# Patient Record
Sex: Female | Born: 1975 | Race: Black or African American | Hispanic: No | Marital: Single | Smoking: Never smoker
Health system: Southern US, Community
[De-identification: ages and names within clinical notes are randomized; demographics above are authoritative.]

## PROBLEM LIST (undated history)

## (undated) DIAGNOSIS — T7840XA Allergy, unspecified, initial encounter: Secondary | ICD-10-CM

## (undated) DIAGNOSIS — M1990 Unspecified osteoarthritis, unspecified site: Secondary | ICD-10-CM

## (undated) DIAGNOSIS — M5430 Sciatica, unspecified side: Secondary | ICD-10-CM

## (undated) DIAGNOSIS — F419 Anxiety disorder, unspecified: Secondary | ICD-10-CM

## (undated) DIAGNOSIS — L309 Dermatitis, unspecified: Secondary | ICD-10-CM

## (undated) DIAGNOSIS — I10 Essential (primary) hypertension: Secondary | ICD-10-CM

## (undated) DIAGNOSIS — J45909 Unspecified asthma, uncomplicated: Secondary | ICD-10-CM

## (undated) DIAGNOSIS — E669 Obesity, unspecified: Secondary | ICD-10-CM

## (undated) HISTORY — DX: Allergy, unspecified, initial encounter: T78.40XA

## (undated) HISTORY — PX: NO PAST SURGERIES: SHX2092

## (undated) HISTORY — PX: WISDOM TOOTH EXTRACTION: SHX21

## (undated) HISTORY — DX: Sciatica, unspecified side: M54.30

## (undated) HISTORY — DX: Anxiety disorder, unspecified: F41.9

---

## 1998-11-09 ENCOUNTER — Emergency Department (HOSPITAL_COMMUNITY): Admission: EM | Admit: 1998-11-09 | Discharge: 1998-11-09 | Payer: Self-pay

## 2003-03-16 ENCOUNTER — Emergency Department (HOSPITAL_COMMUNITY): Admission: EM | Admit: 2003-03-16 | Discharge: 2003-03-16 | Payer: Self-pay | Admitting: Emergency Medicine

## 2005-12-12 ENCOUNTER — Emergency Department (HOSPITAL_COMMUNITY): Admission: EM | Admit: 2005-12-12 | Discharge: 2005-12-12 | Payer: Self-pay | Admitting: Emergency Medicine

## 2007-05-01 ENCOUNTER — Emergency Department (HOSPITAL_COMMUNITY): Admission: EM | Admit: 2007-05-01 | Discharge: 2007-05-01 | Payer: Self-pay | Admitting: Emergency Medicine

## 2008-01-22 ENCOUNTER — Emergency Department (HOSPITAL_COMMUNITY): Admission: EM | Admit: 2008-01-22 | Discharge: 2008-01-22 | Payer: Self-pay | Admitting: Emergency Medicine

## 2008-04-06 ENCOUNTER — Emergency Department (HOSPITAL_COMMUNITY): Admission: EM | Admit: 2008-04-06 | Discharge: 2008-04-06 | Payer: Self-pay | Admitting: Emergency Medicine

## 2008-07-01 ENCOUNTER — Emergency Department (HOSPITAL_COMMUNITY): Admission: EM | Admit: 2008-07-01 | Discharge: 2008-07-01 | Payer: Self-pay | Admitting: *Deleted

## 2008-08-14 ENCOUNTER — Emergency Department (HOSPITAL_COMMUNITY): Admission: EM | Admit: 2008-08-14 | Discharge: 2008-08-14 | Payer: Self-pay | Admitting: Emergency Medicine

## 2008-08-19 ENCOUNTER — Emergency Department (HOSPITAL_COMMUNITY): Admission: EM | Admit: 2008-08-19 | Discharge: 2008-08-19 | Payer: Self-pay | Admitting: Emergency Medicine

## 2009-09-13 ENCOUNTER — Emergency Department (HOSPITAL_COMMUNITY): Admission: EM | Admit: 2009-09-13 | Discharge: 2009-09-13 | Payer: Self-pay | Admitting: Emergency Medicine

## 2010-05-14 LAB — URINALYSIS, ROUTINE W REFLEX MICROSCOPIC
Bilirubin Urine: NEGATIVE
Glucose, UA: NEGATIVE mg/dL
Hgb urine dipstick: NEGATIVE
Ketones, ur: NEGATIVE mg/dL
Nitrite: NEGATIVE
Protein, ur: NEGATIVE mg/dL
Specific Gravity, Urine: 1.03 (ref 1.005–1.030)
Urobilinogen, UA: 0.2 mg/dL (ref 0.0–1.0)
pH: 5.5 (ref 5.0–8.0)

## 2011-02-04 ENCOUNTER — Emergency Department (HOSPITAL_COMMUNITY)
Admission: EM | Admit: 2011-02-04 | Discharge: 2011-02-04 | Disposition: A | Discharge status: Home / Self Care | Payer: Self-pay | Attending: Emergency Medicine | Admitting: Emergency Medicine

## 2011-02-04 ENCOUNTER — Encounter: Payer: Self-pay | Admitting: Emergency Medicine

## 2011-02-04 ENCOUNTER — Emergency Department (HOSPITAL_COMMUNITY): Payer: Self-pay

## 2011-02-04 ENCOUNTER — Other Ambulatory Visit: Payer: Self-pay

## 2011-02-04 DIAGNOSIS — M5430 Sciatica, unspecified side: Secondary | ICD-10-CM | POA: Insufficient documentation

## 2011-02-04 DIAGNOSIS — M545 Low back pain, unspecified: Secondary | ICD-10-CM | POA: Insufficient documentation

## 2011-02-04 DIAGNOSIS — S335XXA Sprain of ligaments of lumbar spine, initial encounter: Secondary | ICD-10-CM | POA: Insufficient documentation

## 2011-02-04 DIAGNOSIS — M79609 Pain in unspecified limb: Secondary | ICD-10-CM | POA: Insufficient documentation

## 2011-02-04 DIAGNOSIS — X500XXA Overexertion from strenuous movement or load, initial encounter: Secondary | ICD-10-CM | POA: Insufficient documentation

## 2011-02-04 DIAGNOSIS — S39012A Strain of muscle, fascia and tendon of lower back, initial encounter: Secondary | ICD-10-CM

## 2011-02-04 MED ORDER — OXYCODONE-ACETAMINOPHEN 5-325 MG PO TABS: 1.0000 | ORAL_TABLET | Freq: Four times a day (QID) | ORAL | Status: AC | PRN

## 2011-02-04 MED ORDER — PREDNISONE 20 MG PO TABS
60.0000 mg | ORAL_TABLET | Freq: Once | ORAL | Status: AC
  Administered 2011-02-04: 60 mg via ORAL
  Filled 2011-02-04: qty 3

## 2011-02-04 MED ORDER — OXYCODONE-ACETAMINOPHEN 5-325 MG PO TABS
2.0000 | ORAL_TABLET | Freq: Once | ORAL | Status: AC
  Administered 2011-02-04: 2 via ORAL
  Filled 2011-02-04: qty 2

## 2011-02-04 MED ORDER — PREDNISONE 20 MG PO TABS: ORAL_TABLET | ORAL | Status: DC

## 2011-02-04 NOTE — ED Notes (Signed)
Received bedside report from [place], [place].  Patient currently being transported to x-ray.

## 2011-02-04 NOTE — ED Notes (Signed)
EDP at bedside  

## 2011-02-04 NOTE — ED Notes (Signed)
Patient currently sitting up in bed; no respiratory or acute distress noted.  Updated patient on plan of care; informed patient that we are currently waiting on x-ray results to come back and for the EDP to finish discharge paperwork.  Patient has no questions or concerns at this time.  Will continue to monitor.
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
## 2011-02-04 NOTE — ED Notes (Signed)
Pt reports left leg pain after sneezing this morning.

## 2011-02-04 NOTE — ED Notes (Signed)
Patient given copy of discharge paperwork; went over discharge instructions with patient.  Instructed patient to take prednisone and percocet as directed.  Instructed patient to follow up with primary care physician on Monday, and to also make arrangements for possible back specialist.  Instructed to avoid bending at the waist/lifting heavy items over 20 pounds for the next week, and to apply heat packs for 20 minutes to help relieve pain.  Patient instructed to return to the ED for new, worsening, or concerning symptoms.

## 2011-02-08 ENCOUNTER — Encounter (HOSPITAL_COMMUNITY): Payer: Self-pay | Admitting: Emergency Medicine

## 2011-02-08 ENCOUNTER — Emergency Department (HOSPITAL_COMMUNITY)
Admission: EM | Admit: 2011-02-08 | Discharge: 2011-02-08 | Disposition: A | Discharge status: Home / Self Care | Payer: Self-pay | Attending: Emergency Medicine | Admitting: Emergency Medicine

## 2011-02-08 DIAGNOSIS — M545 Low back pain, unspecified: Secondary | ICD-10-CM | POA: Insufficient documentation

## 2011-02-08 DIAGNOSIS — M79609 Pain in unspecified limb: Secondary | ICD-10-CM | POA: Insufficient documentation

## 2011-02-08 DIAGNOSIS — IMO0001 Reserved for inherently not codable concepts without codable children: Secondary | ICD-10-CM | POA: Insufficient documentation

## 2011-02-08 DIAGNOSIS — M5430 Sciatica, unspecified side: Secondary | ICD-10-CM | POA: Insufficient documentation

## 2011-02-08 MED ORDER — DIAZEPAM 5 MG PO TABS
5.0000 mg | ORAL_TABLET | Freq: Once | ORAL | Status: AC
  Administered 2011-02-08: 5 mg via ORAL
  Filled 2011-02-08: qty 1

## 2011-02-08 MED ORDER — OXYCODONE-ACETAMINOPHEN 5-325 MG PO TABS
2.0000 | ORAL_TABLET | Freq: Once | ORAL | Status: AC
  Administered 2011-02-08: 2 via ORAL
  Filled 2011-02-08: qty 2

## 2011-02-08 MED ORDER — IBUPROFEN 800 MG PO TABS: 800.0000 mg | ORAL_TABLET | Freq: Three times a day (TID) | ORAL | Status: AC

## 2011-02-08 MED ORDER — OXYCODONE-ACETAMINOPHEN 5-325 MG PO TABS: 2.0000 | ORAL_TABLET | Freq: Three times a day (TID) | ORAL | Status: AC | PRN

## 2011-02-08 MED ORDER — DIAZEPAM 5 MG PO TABS: 5.0000 mg | ORAL_TABLET | Freq: Every day | ORAL | Status: AC

## 2011-02-08 MED ORDER — IBUPROFEN 800 MG PO TABS
800.0000 mg | ORAL_TABLET | Freq: Once | ORAL | Status: AC
  Administered 2011-02-08: 800 mg via ORAL
  Filled 2011-02-08: qty 1
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
## 2011-02-08 NOTE — ED Notes (Signed)
Pt here with c/o left leg pain that started on Saturday.  Pt was seen here for the same on Saturday and reports that the pain meds given had not helped.

## 2011-02-08 NOTE — ED Notes (Signed)
Pt states she came to ER last Sat afternoon with burning pain to LLE, was treated and given steroids and pain meds and sent home, meds "have not helped." Pt denies swelling to extremities.

## 2012-03-03 ENCOUNTER — Emergency Department (HOSPITAL_COMMUNITY)
Admission: EM | Admit: 2012-03-03 | Discharge: 2012-03-03 | Disposition: A | Discharge status: Home / Self Care | Payer: No Typology Code available for payment source | Attending: Emergency Medicine | Admitting: Emergency Medicine

## 2012-03-03 ENCOUNTER — Emergency Department (HOSPITAL_COMMUNITY): Payer: Self-pay

## 2012-03-03 ENCOUNTER — Encounter (HOSPITAL_COMMUNITY): Payer: Self-pay | Admitting: Emergency Medicine

## 2012-03-03 DIAGNOSIS — R0781 Pleurodynia: Secondary | ICD-10-CM

## 2012-03-03 DIAGNOSIS — Y9389 Activity, other specified: Secondary | ICD-10-CM | POA: Insufficient documentation

## 2012-03-03 DIAGNOSIS — R059 Cough, unspecified: Secondary | ICD-10-CM | POA: Insufficient documentation

## 2012-03-03 DIAGNOSIS — Y92410 Unspecified street and highway as the place of occurrence of the external cause: Secondary | ICD-10-CM | POA: Insufficient documentation

## 2012-03-03 DIAGNOSIS — Z79899 Other long term (current) drug therapy: Secondary | ICD-10-CM | POA: Insufficient documentation

## 2012-03-03 DIAGNOSIS — R05 Cough: Secondary | ICD-10-CM | POA: Insufficient documentation

## 2012-03-03 DIAGNOSIS — I10 Essential (primary) hypertension: Secondary | ICD-10-CM | POA: Insufficient documentation

## 2012-03-03 DIAGNOSIS — S298XXA Other specified injuries of thorax, initial encounter: Secondary | ICD-10-CM | POA: Insufficient documentation

## 2012-03-03 MED ORDER — BENZONATATE 100 MG PO CAPS: 100.0000 mg | ORAL_CAPSULE | Freq: Three times a day (TID) | ORAL | Status: DC

## 2012-03-03 MED ORDER — HYDROCOD POLST-CHLORPHEN POLST 10-8 MG/5ML PO LQCR
10.0000 mL | Freq: Two times a day (BID) | ORAL | Status: DC | PRN
  Administered 2012-03-03: 10 mL via ORAL
  Filled 2012-03-03: qty 5

## 2012-03-03 MED ORDER — HYDROCODONE-ACETAMINOPHEN 5-500 MG PO TABS: 1.0000 | ORAL_TABLET | Freq: Four times a day (QID) | ORAL | Status: DC | PRN
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
## 2012-03-03 NOTE — ED Provider Notes (Signed)
Medical screening examination/treatment/procedure(s) were performed by non-physician practitioner and as supervising physician I was immediately available for consultation/collaboration.  Doug Sou, MD 03/03/12 2348

## 2012-12-15 ENCOUNTER — Encounter (HOSPITAL_COMMUNITY): Payer: Self-pay | Admitting: Emergency Medicine

## 2012-12-15 ENCOUNTER — Emergency Department (HOSPITAL_COMMUNITY)
Admission: EM | Admit: 2012-12-15 | Discharge: 2012-12-15 | Disposition: A | Discharge status: Home / Self Care | Payer: Self-pay | Attending: Emergency Medicine | Admitting: Emergency Medicine

## 2012-12-15 DIAGNOSIS — Z880 Allergy status to penicillin: Secondary | ICD-10-CM | POA: Insufficient documentation

## 2012-12-15 DIAGNOSIS — L240 Irritant contact dermatitis due to detergents: Secondary | ICD-10-CM | POA: Insufficient documentation

## 2012-12-15 DIAGNOSIS — L309 Dermatitis, unspecified: Secondary | ICD-10-CM

## 2012-12-15 DIAGNOSIS — E669 Obesity, unspecified: Secondary | ICD-10-CM | POA: Insufficient documentation

## 2012-12-15 DIAGNOSIS — I10 Essential (primary) hypertension: Secondary | ICD-10-CM | POA: Insufficient documentation

## 2012-12-15 HISTORY — DX: Obesity, unspecified: E66.9

## 2012-12-15 HISTORY — DX: Essential (primary) hypertension: I10

## 2012-12-15 HISTORY — DX: Dermatitis, unspecified: L30.9

## 2012-12-15 MED ORDER — FAMOTIDINE 20 MG PO TABS: 20.0000 mg | ORAL_TABLET | Freq: Two times a day (BID) | ORAL | Status: DC

## 2012-12-15 MED ORDER — TRIAMCINOLONE ACETONIDE 0.025 % EX OINT: TOPICAL_OINTMENT | Freq: Two times a day (BID) | CUTANEOUS | Status: DC

## 2012-12-15 MED ORDER — PREDNISONE 20 MG PO TABS: 40.0000 mg | ORAL_TABLET | Freq: Every day | ORAL | Status: DC

## 2012-12-15 MED ORDER — DIPHENHYDRAMINE HCL 25 MG PO TABS: 25.0000 mg | ORAL_TABLET | Freq: Four times a day (QID) | ORAL | Status: DC | PRN

## 2012-12-15 NOTE — ED Notes (Signed)
Pt. reports generalized itchy rashes/hives - eczema flare up onset several days ago , respirations unlabored / airway intact .

## 2012-12-15 NOTE — ED Provider Notes (Signed)
CSN: [id number]     Arrival date & time 12/15/12  2213 History   First MD Initiated Contact with Patient 12/15/12 2256     Chief Complaint  Patient presents with  . Eczema   (Consider location/radiation/quality/duration/timing/severity/associated sxs/prior Treatment) Patient is a 37 y.o. female presenting with rash. The history is provided by the patient. No language interpreter was used.  Rash Location:  Torso, shoulder/arm and head/neck Head/neck rash location:  L neck and R neck Shoulder/arm rash location:  L arm and R arm Quality: dryness, itchiness and redness   Quality: not peeling, not scaling and not weeping   Severity:  Moderate Onset quality:  Gradual Duration:  1 month Timing:  Constant Progression:  Worsening Chronicity:  Recurrent Context: new detergent/soap   Context: not chemical exposure, not exposure to similar rash and not insect bite/sting   Relieved by:  Nothing Exacerbated by: Itching. Ineffective treatments: Aquaphor and vaseline. Associated symptoms: no fever, no myalgias, no nausea, no shortness of breath, no throat swelling, no tongue swelling and not vomiting     Past Medical History  Diagnosis Date  . Hypertension   . Obesity   . Eczema    History reviewed. No pertinent past surgical history. No family history on file. History  Substance Use Topics  . Smoking status: Never Smoker   . Smokeless tobacco: Not on file  . Alcohol Use: No   OB History   Grav Para Term Preterm Abortions TAB SAB Ect Mult Living                 Review of Systems  Constitutional: Negative for fever.  Respiratory: Negative for shortness of breath and stridor.   Gastrointestinal: Negative for nausea and vomiting.  Musculoskeletal: Negative for myalgias.  Skin: Positive for rash.  Neurological: Negative for weakness and numbness.  All other systems reviewed and are negative.    Allergies  Penicillins  Home Medications   Current Outpatient Rx  Name  Route   Sig  Dispense  Refill  . Skin Protectants, Misc. (EUCERIN) cream   Topical   Apply 1 application topically 4 (four) times daily as needed for dry skin.         Marland Kitchen diphenhydrAMINE (BENADRYL) 25 MG tablet   Oral   Take 1 tablet (25 mg total) by mouth every 6 (six) hours as needed for itching (Rash).   30 tablet   0   . famotidine (PEPCID) 20 MG tablet   Oral   Take 1 tablet (20 mg total) by mouth 2 (two) times daily.   30 tablet   0   . predniSONE (DELTASONE) 20 MG tablet   Oral   Take 2 tablets (40 mg total) by mouth daily.   10 tablet   0   . triamcinolone (KENALOG) 0.025 % ointment   Topical   Apply topically 2 (two) times daily. Do not apply to face   15 g   1    BP 173/98  Pulse 91  Temp(Src) 97.3 F (36.3 C) (Oral)  Resp 14  Wt 329 lb (149.233 kg)  BMI 53.13 kg/m2  SpO2 97%  LMP 11/27/2012  Physical Exam  Nursing note and vitals reviewed. Constitutional: She is oriented to person, place, and time. She appears well-developed and well-nourished. No distress.  HENT:  Head: Normocephalic and atraumatic.  Mouth/Throat: Oropharynx is clear and moist. No oropharyngeal exudate.  Eyes: Conjunctivae and EOM are normal. Pupils are equal, round, and reactive to light. No scleral  icterus.  Neck: Normal range of motion. Neck supple.  Cardiovascular: Normal rate, regular rhythm and normal heart sounds.   Pulmonary/Chest: Effort normal and breath sounds normal. No stridor. No respiratory distress. She has no wheezes. She has no rales.  Abdominal: Soft. She exhibits no distension. There is no tenderness. There is no rebound and no guarding.  Musculoskeletal: Normal range of motion.  Neurological: She is alert and oriented to person, place, and time.  Skin: Skin is warm and dry. Rash noted. She is not diaphoretic. No erythema. No pallor.  Erythematous, dry, scaling, macular, pruritic rash with scattered punctate papules. No weeping, bleeding, or peeling of skin.   Psychiatric: She has a normal mood and affect. Her behavior is normal.    ED Course  Procedures (including critical care time) Labs Review Labs Reviewed - No data to display Imaging Review No results found.  EKG Interpretation   None       MDM   1. Eczema    37 year old female with a history of eczema presents for and eczematous rash x 1 month. Patient endorses trying a new detergent awhile ago that may have caused her symptoms to worsen. She denies atypical oral ingestion or recent abx use. Patient well and nontoxic appearing, hemodynamically stable, and afebrile. No angioedema appreciated and airway is patent. Patient tolerating secretions without difficulty. Lungs clear to auscultation bilaterally. Rash non-concerning for SJS, erythema multiforme major, or erythema multiforme minor. Patient is appropriate for discharge today with prescriptions for Benadryl, Pepcid, and prednisone for symptoms. Will also prescribe topical Kenalog ointment and have advised continued frequent application of moisturizer. Return precautions discussed and patient agreeable to plan with no unaddressed concerns.    Antonietta Breach, PA-C 12/18/12 1810

## 2012-12-18 NOTE — ED Provider Notes (Signed)
Medical screening examination/treatment/procedure(s) were performed by non-physician practitioner and as supervising physician I was immediately available for consultation/collaboration.    Sunnie Nielsen, MD 12/18/12 2257

## 2013-01-12 ENCOUNTER — Emergency Department (HOSPITAL_COMMUNITY)
Admission: EM | Admit: 2013-01-12 | Discharge: 2013-01-12 | Disposition: A | Discharge status: Home / Self Care | Payer: BC Managed Care – PPO | Attending: Emergency Medicine | Admitting: Emergency Medicine

## 2013-01-12 ENCOUNTER — Encounter (HOSPITAL_COMMUNITY): Payer: Self-pay | Admitting: Emergency Medicine

## 2013-01-12 DIAGNOSIS — Z880 Allergy status to penicillin: Secondary | ICD-10-CM | POA: Insufficient documentation

## 2013-01-12 DIAGNOSIS — IMO0002 Reserved for concepts with insufficient information to code with codable children: Secondary | ICD-10-CM | POA: Insufficient documentation

## 2013-01-12 DIAGNOSIS — E669 Obesity, unspecified: Secondary | ICD-10-CM | POA: Insufficient documentation

## 2013-01-12 DIAGNOSIS — L309 Dermatitis, unspecified: Secondary | ICD-10-CM

## 2013-01-12 DIAGNOSIS — I10 Essential (primary) hypertension: Secondary | ICD-10-CM | POA: Insufficient documentation

## 2013-01-12 DIAGNOSIS — Z79899 Other long term (current) drug therapy: Secondary | ICD-10-CM | POA: Insufficient documentation

## 2013-01-12 DIAGNOSIS — L259 Unspecified contact dermatitis, unspecified cause: Secondary | ICD-10-CM | POA: Insufficient documentation

## 2013-01-12 MED ORDER — PREDNISONE 10 MG PO TABS: ORAL_TABLET | ORAL | Status: DC

## 2013-01-12 NOTE — ED Notes (Signed)
Reports a hx of eczema and states that over the past month it has really been bothering her. States that last time she was here she was given prednisone and a cream. Denies any other pain a this time.

## 2013-01-12 NOTE — ED Provider Notes (Signed)
CSN: [id number]     Arrival date & time 01/12/13  1842 History   None    This chart was scribed for non-physician practitioner, Teressa Lower, NP working with Hilario Quarry, MD by Arlan Organ, ED Scribe. This patient was seen in room TR07C/TR07C and the patient's care was started at 7:25 PM.   Chief Complaint  Patient presents with  . Rash   The history is provided by the patient. No language interpreter was used.   HPI Comments: Rebecca Collier is a 37 y.o. female with a hx of eczema who presents to the Emergency Department complaining of a reoccurring, unchanged, constant episode of a rash secondary to eczema on her neck and upper extremities bilaterally that started 1 month ago.  She states over the past month, the rash has gradually worsened. She reports previous eczema related rashes were relieved with prednisone. Pt denies any other pain at this time.  PCP- Pt currently does not have one Past Medical History  Diagnosis Date  . Hypertension   . Obesity   . Eczema    History reviewed. No pertinent past surgical history. History reviewed. No pertinent family history. History  Substance Use Topics  . Smoking status: Never Smoker   . Smokeless tobacco: Not on file  . Alcohol Use: No   OB History   Grav Para Term Preterm Abortions TAB SAB Ect Mult Living                 Review of Systems  Skin: Positive for rash (eczema).  All other systems reviewed and are negative.    Allergies  Penicillins  Home Medications   Current Outpatient Rx  Name  Route  Sig  Dispense  Refill  . diphenhydrAMINE (BENADRYL) 25 MG tablet   Oral   Take 1 tablet (25 mg total) by mouth every 6 (six) hours as needed for itching (Rash).   30 tablet   0   . famotidine (PEPCID) 20 MG tablet   Oral   Take 1 tablet (20 mg total) by mouth 2 (two) times daily.   30 tablet   0   . predniSONE (DELTASONE) 20 MG tablet   Oral   Take 2 tablets (40 mg total) by mouth daily.   10 tablet   0   .  Skin Protectants, Misc. (EUCERIN) cream   Topical   Apply 1 application topically 4 (four) times daily as needed for dry skin.         Marland Kitchen triamcinolone (KENALOG) 0.025 % ointment   Topical   Apply topically 2 (two) times daily. Do not apply to face   15 g   1    Triage Vitals: BP 180/80  Pulse 71  Temp(Src) 98.1 F (36.7 C) (Oral)  Resp 18  Ht 5\' 6"  (1.676 m)  Wt 322 lb (146.058 kg)  BMI 52.00 kg/m2  SpO2 100%  LMP 12/28/2012 Physical Exam  Nursing note and vitals reviewed. Constitutional: She is oriented to person, place, and time. She appears well-developed and well-nourished.  HENT:  Head: Normocephalic and atraumatic.  Eyes: EOM are normal.  Neck: Normal range of motion.  Cardiovascular: Normal rate.   Pulmonary/Chest: Effort normal.  Musculoskeletal: Normal range of motion.  Neurological: She is alert and oriented to person, place, and time.  Skin: Skin is warm and dry. Rash noted.  Red scaly skin to bilateral upper extremities and neck  Psychiatric: She has a normal mood and affect. Her behavior is normal.  ED Course  Procedures (including critical care time)  DIAGNOSTIC STUDIES: Oxygen Saturation is 100% on RA, Normal by my interpretation.    COORDINATION OF CARE: 6:54 PM-Discussed treatment plan with pt at bedside and pt agreed to plan.     Labs Review Labs Reviewed - No data to display Imaging Review No results found.  EKG Interpretation   None       MDM   1. Eczema    Pt given dose pack and she has triamcinolone at home  I personally performed the services described in this documentation, which was scribed in my presence. The recorded information has been reviewed and is accurate.   Teressa Lower, NP 01/12/13 2129

## 2013-01-12 NOTE — ED Notes (Signed)
Pt given d/c instructions and verbalized understanding. NAD at this time.  

## 2013-01-14 NOTE — ED Provider Notes (Signed)
History/physical exam/procedure(s) were performed by non-physician practitioner and as supervising physician I was immediately available for consultation/collaboration. I have reviewed all notes and am in agreement with care and plan.   Shalanda Brogden S Camika Marsico, MD 01/14/13 1413 

## 2013-02-06 ENCOUNTER — Ambulatory Visit (INDEPENDENT_AMBULATORY_CARE_PROVIDER_SITE_OTHER): Payer: BC Managed Care – PPO | Admitting: Family Medicine

## 2013-02-06 ENCOUNTER — Encounter: Payer: Self-pay | Admitting: Family Medicine

## 2013-02-06 VITALS — BP 152/90 | Temp 99.0°F | Ht 65.5 in | Wt 322.0 lb

## 2013-02-06 DIAGNOSIS — L259 Unspecified contact dermatitis, unspecified cause: Secondary | ICD-10-CM

## 2013-02-06 DIAGNOSIS — L309 Dermatitis, unspecified: Secondary | ICD-10-CM | POA: Insufficient documentation

## 2013-02-06 DIAGNOSIS — Z7689 Persons encountering health services in other specified circumstances: Secondary | ICD-10-CM

## 2013-02-06 DIAGNOSIS — R252 Cramp and spasm: Secondary | ICD-10-CM

## 2013-02-06 DIAGNOSIS — I10 Essential (primary) hypertension: Secondary | ICD-10-CM

## 2013-02-06 DIAGNOSIS — L2084 Intrinsic (allergic) eczema: Secondary | ICD-10-CM | POA: Insufficient documentation

## 2013-02-06 DIAGNOSIS — Z800 Family history of malignant neoplasm of digestive organs: Secondary | ICD-10-CM

## 2013-02-06 DIAGNOSIS — Z7189 Other specified counseling: Secondary | ICD-10-CM

## 2013-02-06 MED ORDER — TRIAMCINOLONE ACETONIDE 0.1 % EX CREA: 1.0000 "application " | TOPICAL_CREAM | Freq: Two times a day (BID) | CUTANEOUS | Status: DC

## 2013-02-06 NOTE — Patient Instructions (Addendum)
-  schedule physical/follow u in next 1-2 months in the morning   For your skin: -humidfier -all hypoallergenic soap (dove or aveeno), detergent (dove) and products -CERAVE cream or Cetaphil - restoraderm daily -steroid cream daily to twice daily for next several weeks on bad areas - do not use on face or in creases  We recommend the following healthy lifestyle measures: - eat a healthy diet consisting of lots of vegetables, fruits, beans, nuts, seeds, healthy meats such as white chicken and fish and whole grains.  - avoid fried foods, fast food, processed foods, sodas, red meet and other fattening foods.  - get a least 150 minutes of aerobic exercise per week.

## 2013-02-06 NOTE — Progress Notes (Signed)
Pre visit review using our clinic review tool, if applicable. No additional management support is needed unless otherwise documented below in the visit note. 

## 2013-02-06 NOTE — Progress Notes (Signed)
Chief Complaint  Patient presents with  . Establish Care  . Eczema    flare up x 2 months     HPI:  Rebecca Collier is here to establish care.  Last PCP and physical:  Eczema: -has had skin issues since a child -flares up worse in the winter -uses dial soap and body wash; tide laundry soap; Eucerin moisturizer and shea butter  Sciatica: -started about 2 years ago -seen in ED and told sciatic -cramps in L calf and upper leg intermittently on most days -no weakness, fatigue, numbness, constant pain  HTN: -does not want to take medication for this -is going to start exercising -no symptoms  Has the following chronic problems and concerns today:  Patient Active Problem List   Diagnosis Date Noted  . Eczema 02/06/2013  . Essential hypertension, benign 02/06/2013  . Family history of colon cancer in mother (age 40) 02/06/2013   Health Maintenance: -will schedule physical, not a fan of medications or vaccines  ROS: See pertinent positives and negatives per HPI.  Past Medical History  Diagnosis Date  . Hypertension   . Obesity   . Eczema   . Sciatica     spasms daily in left leg     Family History  Problem Relation Age of Onset  . Colon cancer Mother     dx at age  . Hyperlipidemia Mother     History   Social History  . Marital Status: Single    Spouse Name: N/A    Number of Children: N/A  . Years of Education: N/A   Social History Main Topics  . Smoking status: Never Smoker   . Smokeless tobacco: None  . Alcohol Use: Yes     Comment: socially  . Drug Use: No  . Sexual Activity: None   Other Topics Concern  . None   Social History Narrative   Work or School: Clinical biochemist, [employer]      Home Situation: lives with mother      Spiritual Beliefs: none      Lifestyle: no regular exercise; diet is ok             Current outpatient prescriptions:diphenhydrAMINE (BENADRYL) 25 mg capsule, Take 25 mg by mouth every 6 (six) hours as needed for  itching or allergies., Disp: , Rfl: ;  fish oil-omega-3 fatty acids 1000 MG capsule, Take 2 g by mouth daily., Disp: , Rfl: ;  Skin Protectants, Misc. (EUCERIN) cream, Apply 1 application topically 4 (four) times daily as needed for dry skin., Disp: , Rfl:  famotidine (PEPCID) 20 MG tablet, Take 20 mg by mouth 2 (two) times daily as needed for heartburn or indigestion., Disp: , Rfl: ;  triamcinolone cream (KENALOG) 0.1 %, Apply 1 application topically 2 (two) times daily., Disp: 30 g, Rfl: 3  EXAM:  Filed Vitals:   02/06/13 1203  BP: 152/90  Temp: 99 F (37.2 C)    Body mass index is 52.75 kg/(m^2).  GENERAL: vitals reviewed and listed above, alert, oriented, appears well hydrated and in no acute distress  HEENT: atraumatic, conjunttiva clear, no obvious abnormalities on inspection of external nose and ears  NECK: no obvious masses on inspection  LUNGS: clear to auscultation bilaterally, no wheezes, rales or rhonchi, good air movement  CV: HRRR, no peripheral edema  MS: moves all extremities without noticeable abnormality  PSYCH: pleasant and cooperative, no obvious depression or anxiety  ASSESSMENT AND PLAN:  Discussed the following assessment and plan:  Encounter to establish care  Eczema - Plan: triamcinolone cream (KENALOG) 0.1 %  Leg cramps  Essential hypertension, benign  Family history of colon cancer in mother (age 7)  -We reviewed the PMH, PSH, FH, SH, Meds and Allergies. -We provided refills for any medications we will prescribe as needed. -We addressed current concerns per orders and patient instructions. -We have asked for records for pertinent exams, studies, vaccines and notes from previous providers. -We have advised patient to follow up per instructions below.  -she does not want medication for BP, lifestyle recs provided, follow up in 1-2 months -eczema tx per labs and orders -follow up for physical with pap - has never had pap  -Patient advised  to return or notify a doctor immediately if symptoms worsen or persist or new concerns arise.  Patient Instructions  -schedule physical/follow u in next 1-2 months in the morning   For your skin: -humidfier -all hypoallergenic soap (dove or aveeno), detergent (dove) and products -CERAVE cream or Cetaphil - restoraderm daily -steroid cream daily to twice daily for next several weeks on bad areas - do not use on face or in creases  We recommend the following healthy lifestyle measures: - eat a healthy diet consisting of lots of vegetables, fruits, beans, nuts, seeds, healthy meats such as white chicken and fish and whole grains.  - avoid fried foods, fast food, processed foods, sodas, red meet and other fattening foods.  - get a least 150 minutes of aerobic exercise per week.       Kriste Basque R.

## 2013-03-06 ENCOUNTER — Encounter: Payer: BC Managed Care – PPO | Admitting: Family Medicine

## 2013-03-13 ENCOUNTER — Encounter: Payer: BC Managed Care – PPO | Admitting: Family Medicine

## 2013-10-12 ENCOUNTER — Encounter (HOSPITAL_COMMUNITY): Payer: Self-pay | Admitting: Emergency Medicine

## 2013-10-12 ENCOUNTER — Emergency Department (HOSPITAL_COMMUNITY)
Admission: EM | Admit: 2013-10-12 | Discharge: 2013-10-12 | Disposition: A | Discharge status: Home / Self Care | Payer: BC Managed Care – PPO | Attending: Emergency Medicine | Admitting: Emergency Medicine

## 2013-10-12 DIAGNOSIS — R110 Nausea: Secondary | ICD-10-CM | POA: Insufficient documentation

## 2013-10-12 DIAGNOSIS — Z79899 Other long term (current) drug therapy: Secondary | ICD-10-CM | POA: Insufficient documentation

## 2013-10-12 DIAGNOSIS — R109 Unspecified abdominal pain: Secondary | ICD-10-CM | POA: Insufficient documentation

## 2013-10-12 DIAGNOSIS — R1013 Epigastric pain: Secondary | ICD-10-CM | POA: Insufficient documentation

## 2013-10-12 DIAGNOSIS — Z8739 Personal history of other diseases of the musculoskeletal system and connective tissue: Secondary | ICD-10-CM | POA: Insufficient documentation

## 2013-10-12 DIAGNOSIS — Z872 Personal history of diseases of the skin and subcutaneous tissue: Secondary | ICD-10-CM | POA: Insufficient documentation

## 2013-10-12 DIAGNOSIS — I10 Essential (primary) hypertension: Secondary | ICD-10-CM | POA: Insufficient documentation

## 2013-10-12 DIAGNOSIS — Z3202 Encounter for pregnancy test, result negative: Secondary | ICD-10-CM | POA: Insufficient documentation

## 2013-10-12 DIAGNOSIS — E669 Obesity, unspecified: Secondary | ICD-10-CM | POA: Insufficient documentation

## 2013-10-12 DIAGNOSIS — Z880 Allergy status to penicillin: Secondary | ICD-10-CM | POA: Insufficient documentation

## 2013-10-12 DIAGNOSIS — IMO0002 Reserved for concepts with insufficient information to code with codable children: Secondary | ICD-10-CM | POA: Insufficient documentation

## 2013-10-12 LAB — COMPREHENSIVE METABOLIC PANEL
ALT: 34 U/L (ref 0–35)
AST: 37 U/L (ref 0–37)
Albumin: 4 g/dL (ref 3.5–5.2)
Alkaline Phosphatase: 116 U/L (ref 39–117)
Anion gap: 12 (ref 5–15)
BUN: 10 mg/dL (ref 6–23)
CO2: 25 mEq/L (ref 19–32)
Calcium: 10.5 mg/dL (ref 8.4–10.5)
Chloride: 101 mEq/L (ref 96–112)
Creatinine, Ser: 0.91 mg/dL (ref 0.50–1.10)
GFR calc Af Amer: >90 mL/min (ref >90)
GFR calc non Af Amer: 80 mL/min — ABNORMAL LOW (ref >90)
Glucose, Bld: 92 mg/dL (ref 70–99)
Potassium: 4 mEq/L (ref 3.7–5.3)
Sodium: 138 mEq/L (ref 137–147)
Total Bilirubin: 0.2 mg/dL — ABNORMAL LOW (ref 0.3–1.2)
Total Protein: 8.8 g/dL — ABNORMAL HIGH (ref 6.0–8.3)

## 2013-10-12 LAB — CBC WITH DIFFERENTIAL/PLATELET
Basophils Absolute: 0 10*3/uL (ref 0.0–0.1)
Basophils Relative: 0 % (ref 0–1)
Eosinophils Absolute: 0.2 10*3/uL (ref 0.0–0.7)
Eosinophils Relative: 3 % (ref 0–5)
HCT: 43.2 % (ref 36.0–46.0)
Hemoglobin: 14.6 g/dL (ref 12.0–15.0)
Lymphocytes Relative: 39 % (ref 12–46)
Lymphs Abs: 2.7 10*3/uL (ref 0.7–4.0)
MCH: 29.2 pg (ref 26.0–34.0)
MCHC: 33.8 g/dL (ref 30.0–36.0)
MCV: 86.4 fL (ref 78.0–100.0)
Monocytes Absolute: 0.5 10*3/uL (ref 0.1–1.0)
Monocytes Relative: 7 % (ref 3–12)
Neutro Abs: 3.5 10*3/uL (ref 1.7–7.7)
Neutrophils Relative %: 51 % (ref 43–77)
Platelets: 280 10*3/uL (ref 150–400)
RBC: 5 MIL/uL (ref 3.87–5.11)
RDW: 13.6 % (ref 11.5–15.5)
WBC: 6.9 10*3/uL (ref 4.0–10.5)

## 2013-10-12 LAB — URINALYSIS, ROUTINE W REFLEX MICROSCOPIC
Bilirubin Urine: NEGATIVE
GLUCOSE, UA: NEGATIVE mg/dL
Hgb urine dipstick: NEGATIVE
Ketones, ur: NEGATIVE mg/dL
LEUKOCYTES UA: NEGATIVE
Nitrite: NEGATIVE
PROTEIN: NEGATIVE mg/dL
Specific Gravity, Urine: 1.016 (ref 1.005–1.030)
Urobilinogen, UA: 0.2 mg/dL (ref 0.0–1.0)
pH: 5.5 (ref 5.0–8.0)

## 2013-10-12 LAB — LIPASE, BLOOD: Lipase: 23 U/L (ref 11–59)

## 2013-10-12 LAB — POC URINE PREG, ED: PREG TEST UR: NEGATIVE

## 2013-10-12 MED ORDER — SODIUM CHLORIDE 0.9 % IV SOLN
1000.0000 mL | INTRAVENOUS | Status: DC
  Administered 2013-10-12: 1000 mL via INTRAVENOUS

## 2013-10-12 MED ORDER — HYDROCHLOROTHIAZIDE 25 MG PO TABS: 25.0000 mg | ORAL_TABLET | Freq: Every day | ORAL | Status: DC

## 2013-10-12 MED ORDER — ONDANSETRON HCL 4 MG/2ML IJ SOLN
4.0000 mg | Freq: Once | INTRAMUSCULAR | Status: AC
  Administered 2013-10-12: 4 mg via INTRAVENOUS
  Filled 2013-10-12: qty 2

## 2013-10-12 MED ORDER — SODIUM CHLORIDE 0.9 % IV SOLN
1000.0000 mL | Freq: Once | INTRAVENOUS | Status: AC
  Administered 2013-10-12: 1000 mL via INTRAVENOUS

## 2013-10-12 MED ORDER — ONDANSETRON HCL 4 MG PO TABS: 4.0000 mg | ORAL_TABLET | Freq: Four times a day (QID) | ORAL | Status: DC

## 2013-10-12 MED ORDER — OMEPRAZOLE 20 MG PO CPDR: 20.0000 mg | DELAYED_RELEASE_CAPSULE | Freq: Every day | ORAL | Status: DC

## 2013-10-12 MED ORDER — HYDROCHLOROTHIAZIDE 25 MG PO TABS
25.0000 mg | ORAL_TABLET | Freq: Once | ORAL | Status: AC
  Administered 2013-10-12: 25 mg via ORAL
  Filled 2013-10-12: qty 1

## 2013-10-12 NOTE — Discharge Instructions (Signed)
Abdominal Pain Many things can cause abdominal pain. Usually, abdominal pain is not caused by a disease and will improve without treatment. It can often be observed and treated at home. Your health care provider will do a physical exam and possibly order blood tests and X-rays to help determine the seriousness of your pain. However, in many cases, more time must pass before a clear cause of the pain can be found. Before that point, your health care provider may not know if you need more testing or further treatment. HOME CARE INSTRUCTIONS  Monitor your abdominal pain for any changes. The following actions may help to alleviate any discomfort you are experiencing:  Only take over-the-counter or prescription medicines as directed by your health care provider.  Do not take laxatives unless directed to do so by your health care provider.  Try a clear liquid diet (broth, tea, or water) as directed by your health care provider. Slowly move to a bland diet as tolerated. SEEK MEDICAL CARE IF:  You have unexplained abdominal pain.  You have abdominal pain associated with nausea or diarrhea.  You have pain when you urinate or have a bowel movement.  You experience abdominal pain that wakes you in the night.  You have abdominal pain that is worsened or improved by eating food.  You have abdominal pain that is worsened with eating fatty foods.  You have a fever. SEEK IMMEDIATE MEDICAL CARE IF:   Your pain does not go away within 2 hours.  You keep throwing up (vomiting).  Your pain is felt only in portions of the abdomen, such as the right side or the left lower portion of the abdomen.  You pass bloody or black tarry stools. MAKE SURE YOU:  Understand these instructions.   Will watch your condition.   Will get help right away if you are not doing well or get worse.  Document Released: 11/23/2004 Document Revised: 02/18/2013 Document Reviewed: 10/23/2012 [facility] Patient Information  2015 [place], [place]. This information is not intended to replace advice given to you by your health care provider. Make sure you discuss any questions you have with your health care provider.  Hypertension Hypertension, commonly called high blood pressure, is when the force of blood pumping through your arteries is too strong. Your arteries are the blood vessels that carry blood from your heart throughout your body. A blood pressure reading consists of a higher number over a lower number, such as 110/72. The higher number (systolic) is the pressure inside your arteries when your heart pumps. The lower number (diastolic) is the pressure inside your arteries when your heart relaxes. Ideally you want your blood pressure below 120/80. Hypertension forces your heart to work harder to pump blood. Your arteries may become narrow or stiff. Having hypertension puts you at risk for heart disease, stroke, and other problems.  RISK FACTORS Some risk factors for high blood pressure are controllable. Others are not.  Risk factors you cannot control include:   Race. You may be at higher risk if you are African American.  Age. Risk increases with age.  Gender. Men are at higher risk than women before age 8 years. After age 32, women are at higher risk than men. Risk factors you can control include:  Not getting enough exercise or physical activity.  Being overweight.  Getting too much fat, sugar, calories, or salt in your diet.  Drinking too much alcohol. SIGNS AND SYMPTOMS Hypertension does not usually cause signs or symptoms. Extremely high  blood pressure (hypertensive crisis) may cause headache, anxiety, shortness of breath, and nosebleed. DIAGNOSIS  To check if you have hypertension, your health care provider will measure your blood pressure while you are seated, with your arm held at the level of your heart. It should be measured at least twice using the same arm. Certain conditions can cause a  difference in blood pressure between your right and left arms. A blood pressure reading that is higher than normal on one occasion does not mean that you need treatment. If one blood pressure reading is high, ask your health care provider about having it checked again. TREATMENT  Treating high blood pressure includes making lifestyle changes and possibly taking medicine. Living a healthy lifestyle can help lower high blood pressure. You may need to change some of your habits. Lifestyle changes may include:  Following the DASH diet. This diet is high in fruits, vegetables, and whole grains. It is low in salt, red meat, and added sugars.  Getting at least 2 hours of brisk physical activity every week.  Losing weight if necessary.  Not smoking.  Limiting alcoholic beverages.  Learning ways to reduce stress. If lifestyle changes are not enough to get your blood pressure under control, your health care provider may prescribe medicine. You may need to take more than one. Work closely with your health care provider to understand the risks and benefits. HOME CARE INSTRUCTIONS  Have your blood pressure rechecked as directed by your health care provider.   Take medicines only as directed by your health care provider. Follow the directions carefully. Blood pressure medicines must be taken as prescribed. The medicine does not work as well when you skip doses. Skipping doses also puts you at risk for problems.   Do not smoke.   Monitor your blood pressure at home as directed by your health care provider. SEEK MEDICAL CARE IF:   You think you are having a reaction to medicines taken.  You have recurrent headaches or feel dizzy.  You have swelling in your ankles.  You have trouble with your vision. SEEK IMMEDIATE MEDICAL CARE IF:  You develop a severe headache or confusion.  You have unusual weakness, numbness, or feel faint.  You have severe chest or abdominal pain.  You vomit  repeatedly.  You have trouble breathing. MAKE SURE YOU:   Understand these instructions.  Will watch your condition.  Will get help right away if you are not doing well or get worse. Document Released: 02/13/2005 Document Revised: 06/30/2013 Document Reviewed: 12/06/2012 [facility] Patient Information 2015 [place], [place]. This information is not intended to replace advice given to you by your health care provider. Make sure you discuss any questions you have with your health care provider.

## 2013-10-12 NOTE — ED Notes (Signed)
Pending d/c to inform MD about pain and BP. BP medication given. Will assess BP in 30 mins.

## 2013-10-12 NOTE — ED Notes (Signed)
Patient c/o upper abd pain that started @ 1 month ago. Patient states since then the pain has become more generalized. Patient denies any difficulties with bowel or bladder. Last BM today, described as "harder than normal". Patient also reports nausea and a sensation of fullness in her abd. Patient has not seen anyone concerning this problem prior to today. Patient mother at bedside.

## 2013-10-12 NOTE — ED Notes (Signed)
MD aware of BP OK to d/c to home.

## 2013-10-12 NOTE — ED Provider Notes (Addendum)
CSN: [id number]     Arrival date & time 10/12/13  1926 History   First MD Initiated Contact with Patient 10/12/13 1942     Chief Complaint  Patient presents with  . Abdominal Pain   HPI The patient presents emergent complaints of abdominal pain that has been ongoing for the last month. Symptoms have been waxing and waning. The pain and discomfort primarily in the upper abdomen. When she eats she feels like she is getting full.  She will get nauseated though. She has not had any trouble with vomiting or diarrhea.. She denies any trouble with constipation. She denies any trouble with dysuria.  Patient has tried medications including Pepto-Bismol. She also took Imodium because she thought that would help her with the nausea. She has not seen any other medical provider. She decided to come to the emergency room because of the persistent symptoms. Past Medical History  Diagnosis Date  . Hypertension   . Obesity   . Eczema   . Sciatica     spasms daily in left leg    History reviewed. No pertinent past surgical history. Family History  Problem Relation Age of Onset  . Colon cancer Mother     dx at age  . Hyperlipidemia Mother    History  Substance Use Topics  . Smoking status: Never Smoker   . Smokeless tobacco: Not on file  . Alcohol Use: Yes     Comment: socially   OB History   Grav Para Term Preterm Abortions TAB SAB Ect Mult Living                 Review of Systems  Constitutional: Negative for unexpected weight change.  Respiratory: Negative for shortness of breath.   Cardiovascular: Negative for chest pain.  Gastrointestinal: Negative for vomiting and diarrhea.  Genitourinary: Negative for dysuria.  All other systems reviewed and are negative.     Allergies  Penicillins  Home Medications   Prior to Admission medications   Medication Sig Start Date End Date Taking? Authorizing Provider  diphenhydrAMINE (BENADRYL) 25 mg capsule Take 25 mg by mouth every 6 (six)  hours as needed for itching or allergies.   Yes Historical Provider, MD  ibuprofen (ADVIL,MOTRIN) 200 MG tablet Take 400 mg by mouth every 6 (six) hours as needed for moderate pain.   Yes Historical Provider, MD  OVER THE COUNTER MEDICATION Take 1 tablet by mouth 2 (two) times daily.   Yes Historical Provider, MD  Skin Protectants, Misc. (EUCERIN) cream Apply 1 application topically 4 (four) times daily as needed for dry skin.   Yes Historical Provider, MD  triamcinolone cream (KENALOG) 0.1 % Apply 1 application topically 2 (two) times daily. 02/06/13  Yes Lucretia Kern, DO  hydrochlorothiazide (HYDRODIURIL) 25 MG tablet Take 1 tablet (25 mg total) by mouth daily. 10/12/13   Dorie Rank, MD  omeprazole (PRILOSEC) 20 MG capsule Take 1 capsule (20 mg total) by mouth daily. 10/12/13   Dorie Rank, MD  ondansetron (ZOFRAN) 4 MG tablet Take 1 tablet (4 mg total) by mouth every 6 (six) hours. 10/12/13   Dorie Rank, MD   BP 177/119  Pulse 65  Temp(Src) 98.2 F (36.8 C) (Oral)  Resp 18  SpO2 100%  LMP 09/03/2013 Physical Exam  Nursing note and vitals reviewed. Constitutional: She appears well-developed and well-nourished. No distress.  HENT:  Head: Normocephalic and atraumatic.  Right Ear: External ear normal.  Left Ear: External ear normal.  Eyes: Conjunctivae are  normal. Right eye exhibits no discharge. Left eye exhibits no discharge. No scleral icterus.  Neck: Neck supple. No tracheal deviation present.  Cardiovascular: Normal rate, regular rhythm and intact distal pulses.   Pulmonary/Chest: Effort normal and breath sounds normal. No stridor. No respiratory distress. She has no wheezes. She has no rales.  Abdominal: Soft. Bowel sounds are normal. She exhibits no distension. There is tenderness in the epigastric area. There is no rebound and no guarding.  Musculoskeletal: She exhibits no edema and no tenderness.  Neurological: She is alert. She has normal strength. No cranial nerve deficit (no facial  droop, extraocular movements intact, no slurred speech) or sensory deficit. She exhibits normal muscle tone. She displays no seizure activity. Coordination normal.  Skin: Skin is warm and dry. No rash noted.  Psychiatric: She has a normal mood and affect.    ED Course  Procedures (including critical care time) Labs Review Labs Reviewed  COMPREHENSIVE METABOLIC PANEL - Abnormal; Notable for the following:    Total Protein 8.8 (*)    Total Bilirubin 0.2 (*)    GFR calc non Af Amer 80 (*)    All other components within normal limits  CBC WITH DIFFERENTIAL  LIPASE, BLOOD  URINALYSIS, ROUTINE W REFLEX MICROSCOPIC  POC URINE PREG, ED   EKG Interpretation  Date/Time:  Sunday October 12 2013 19:45:07 EDT Ventricular Rate:  68 PR Interval:  153 QRS Duration: 89 QT Interval:  387 QTC Calculation: 411 R Axis:   27 Text Interpretation:  Sinus rhythm Abnormal R-wave progression, early transition No significant change since last tracing Confirmed by Aailyah Dunbar  MD-J, Cambreigh Dearing ([id number]) on 10/12/2013 9:35:35 PM Medications  0.9 %  sodium chloride infusion (0 mLs Intravenous Stopped 10/12/13 2129)    Followed by  0.9 %  sodium chloride infusion (1,000 mLs Intravenous New Bag/Given 10/12/13 2032)  hydrochlorothiazide (HYDRODIURIL) tablet 25 mg (not administered)  ondansetron (ZOFRAN) injection 4 mg (4 mg Intravenous Given 10/12/13 2031)     MDM   Final diagnoses:  Abdominal pain, unspecified abdominal location  Essential hypertension    The patient has had mild abdominal pain over the last month  associated with nausea. Her abdominal exam is reassuring. I doubt acute appendicitis or cholecystitis.  However tried taking a course of antacids and will prescribe antinausea medications. Patient will followup with her doctor in the next week to see if that does alleviate her symptoms.  Patient's blood pressure also is elevated. She has never been formally diagnosed with hypertension in the past.  Started on  hydrochlorothiazide and have her followup with her primary doctor.  No signs of end organ ischemia    Dorie Rank, MD 10/12/13 2157

## 2013-10-12 NOTE — ED Notes (Signed)
Patient previous taking Pepcid with benedryl and "exzema creme", states she is no longer taking this.

## 2013-10-12 NOTE — ED Notes (Signed)
Pt presents with c/o abdominal pain for approx one month. Pt says the pain is generalized in nature, denies V/D, admits to nausea.

## 2013-10-12 NOTE — ED Notes (Signed)
Dr Tomi Bamberger notified of patient blood pressure

## 2013-10-12 NOTE — ED Notes (Signed)
Dr Hillard Danker at bedside

## 2013-11-17 ENCOUNTER — Other Ambulatory Visit: Payer: Self-pay | Admitting: Family Medicine

## 2014-03-30 ENCOUNTER — Other Ambulatory Visit: Payer: Self-pay | Admitting: Family Medicine

## 2014-08-20 ENCOUNTER — Encounter (HOSPITAL_COMMUNITY): Payer: Self-pay | Admitting: *Deleted

## 2014-08-20 ENCOUNTER — Emergency Department (HOSPITAL_COMMUNITY): Payer: Self-pay

## 2014-08-20 ENCOUNTER — Emergency Department (HOSPITAL_COMMUNITY)
Admission: EM | Admit: 2014-08-20 | Discharge: 2014-08-20 | Disposition: A | Discharge status: Home / Self Care | Payer: Self-pay | Attending: Emergency Medicine | Admitting: Emergency Medicine

## 2014-08-20 DIAGNOSIS — R062 Wheezing: Secondary | ICD-10-CM

## 2014-08-20 DIAGNOSIS — I10 Essential (primary) hypertension: Secondary | ICD-10-CM

## 2014-08-20 DIAGNOSIS — R0981 Nasal congestion: Secondary | ICD-10-CM | POA: Insufficient documentation

## 2014-08-20 DIAGNOSIS — Z7952 Long term (current) use of systemic steroids: Secondary | ICD-10-CM | POA: Insufficient documentation

## 2014-08-20 DIAGNOSIS — Z880 Allergy status to penicillin: Secondary | ICD-10-CM | POA: Insufficient documentation

## 2014-08-20 DIAGNOSIS — R0602 Shortness of breath: Secondary | ICD-10-CM

## 2014-08-20 DIAGNOSIS — M5430 Sciatica, unspecified side: Secondary | ICD-10-CM | POA: Insufficient documentation

## 2014-08-20 DIAGNOSIS — L309 Dermatitis, unspecified: Secondary | ICD-10-CM | POA: Insufficient documentation

## 2014-08-20 DIAGNOSIS — Z79899 Other long term (current) drug therapy: Secondary | ICD-10-CM | POA: Insufficient documentation

## 2014-08-20 DIAGNOSIS — R079 Chest pain, unspecified: Secondary | ICD-10-CM | POA: Insufficient documentation

## 2014-08-20 MED ORDER — ALBUTEROL SULFATE (2.5 MG/3ML) 0.083% IN NEBU
5.0000 mg | INHALATION_SOLUTION | Freq: Once | RESPIRATORY_TRACT | Status: AC
  Administered 2014-08-20: 5 mg via RESPIRATORY_TRACT
  Filled 2014-08-20: qty 6

## 2014-08-20 MED ORDER — ALBUTEROL SULFATE HFA 108 (90 BASE) MCG/ACT IN AERS
2.0000 | INHALATION_SPRAY | RESPIRATORY_TRACT | Status: DC | PRN
  Administered 2014-08-20: 2 via RESPIRATORY_TRACT
  Filled 2014-08-20: qty 6.7

## 2014-08-20 MED ORDER — HYDROCHLOROTHIAZIDE 25 MG PO TABS: 25.0000 mg | ORAL_TABLET | Freq: Every day | ORAL | Status: DC

## 2014-08-20 MED ORDER — PREDNISONE 20 MG PO TABS: ORAL_TABLET | ORAL | Status: DC

## 2014-08-20 MED ORDER — PREDNISONE 20 MG PO TABS
60.0000 mg | ORAL_TABLET | Freq: Once | ORAL | Status: AC
  Administered 2014-08-20: 60 mg via ORAL
  Filled 2014-08-20: qty 3

## 2014-08-20 MED ORDER — IPRATROPIUM BROMIDE 0.02 % IN SOLN
0.5000 mg | Freq: Once | RESPIRATORY_TRACT | Status: AC
  Administered 2014-08-20: 0.5 mg via RESPIRATORY_TRACT
  Filled 2014-08-20: qty 2.5

## 2014-08-20 NOTE — ED Provider Notes (Signed)
CSN: [id number]     Arrival date & time 08/20/14  0458 History   First MD Initiated Contact with Patient 08/20/14 [phone]     Chief Complaint  Patient presents with  . Shortness of Breath     (Consider location/radiation/quality/duration/timing/severity/associated sxs/prior Treatment) HPI   39 year old obese female with history of hypertension presenting for evaluation of shortness of breath. Patient reports for the past 1 month she has had persistent cough and congestion, and shortness of breath. Cough is nonproductive, shortness of breath worsening with taking deep breath. She also endorsed chest tightness and wheezing. Symptoms lasting throughout the day. It is not worsening with exertion with laying flat. It has been persistent without improvement. No specific treatment tried. No prior history of asthma. No other environmental changes or having new pets. No history of PE or DVT, no recent surgery, prolonged bed rest, unilateral leg swelling or calf pain. Patient does have a rash around the neck and around her face which has been ongoing for several months. She has history of eczema. No history of lupus. She does have a primary care provider but has not follow-up. She has history of hypertension but does not take any medication. She will have access to insurance next month and will follow-up with her doctor.  Past Medical History  Diagnosis Date  . Hypertension   . Obesity   . Eczema   . Sciatica     spasms daily in left leg    History reviewed. No pertinent past surgical history. Family History  Problem Relation Age of Onset  . Colon cancer Mother     dx at age  . Hyperlipidemia Mother    History  Substance Use Topics  . Smoking status: Never Smoker   . Smokeless tobacco: Not on file  . Alcohol Use: Yes     Comment: socially   OB History    No data available     Review of Systems  All other systems reviewed and are negative.     Allergies  Penicillins  Home  Medications   Prior to Admission medications   Medication Sig Start Date End Date Taking? Authorizing Provider  diphenhydrAMINE (BENADRYL) 25 mg capsule Take 25 mg by mouth every 6 (six) hours as needed for itching or allergies.   Yes Historical Provider, MD  ibuprofen (ADVIL,MOTRIN) 200 MG tablet Take 400 mg by mouth every 6 (six) hours as needed for moderate pain.   Yes Historical Provider, MD  Multiple Vitamin (MULTIVITAMIN WITH MINERALS) TABS tablet Take 1 tablet by mouth daily.   Yes Historical Provider, MD  POTASSIUM PO Take 1 tablet by mouth daily.   Yes Historical Provider, MD  Skin Protectants, Misc. (EUCERIN) cream Apply 1 application topically 4 (four) times daily as needed for dry skin.   Yes Historical Provider, MD  triamcinolone cream (KENALOG) 0.1 % APPLY 1 APPLICATION TOPICALLY 2 (TWO) TIMES DAILY. 03/30/14  Yes Lucretia Kern, DO  vitamin B-12 (CYANOCOBALAMIN) 1000 MCG tablet Take 1,000 mcg by mouth daily.   Yes Historical Provider, MD  hydrochlorothiazide (HYDRODIURIL) 25 MG tablet Take 1 tablet (25 mg total) by mouth daily. Patient not taking: Reported on 08/20/2014 10/12/13   Dorie Rank, MD  omeprazole (PRILOSEC) 20 MG capsule Take 1 capsule (20 mg total) by mouth daily. Patient not taking: Reported on 08/20/2014 10/12/13   Dorie Rank, MD  ondansetron (ZOFRAN) 4 MG tablet Take 1 tablet (4 mg total) by mouth every 6 (six) hours. Patient not taking: Reported on  08/20/2014 10/12/13   Dorie Rank, MD   BP 184/101 mmHg  Pulse 69  Temp(Src) 98.5 F (36.9 C) (Oral)  Resp 21  SpO2 100%  LMP 07/30/2014 Physical Exam  Constitutional: She is oriented to person, place, and time. She appears well-developed and well-nourished. No distress.  Moderately obese African-American female, appears to be in no acute distress, nontoxic in appearance  HENT:  Head: Atraumatic.  Mouth/Throat: Oropharynx is clear and moist.  Eyes: Conjunctivae are normal.  Neck: Normal range of motion. Neck supple. No JVD  present. No tracheal deviation present. No thyromegaly present.  No nuchal rigidity  Cardiovascular: Normal rate and regular rhythm.   Pulmonary/Chest: Effort normal. She has wheezes (inspiratory and expiratory wheezes heard throughout.).  Abdominal: Soft. There is no tenderness.  Musculoskeletal: She exhibits no edema.  Lymphadenopathy:    She has no cervical adenopathy.  Neurological: She is alert and oriented to person, place, and time.  Skin: No rash (Eczematic rash noted to face and around neck, noninfected.) noted.  Psychiatric: She has a normal mood and affect.  Nursing note and vitals reviewed.   ED Course  Procedures (including critical care time)  Patient here with progressive shortness of breath, wheezing, nonproductive cough for one month. She is actively wheezing in the ED however maintaining adequate oxygenation without any evidence of respiratory failure. Chest x-ray demonstration a mild right basilar opacity which may reflect atelectasis or possibly mild pneumonia. This finding is likely supportive of atelectasis and less likely infectious due to the duration of her symptoms and the lack of fever or productive cough. Patient is not a smoker.  Due to her wheezing, I will give patient DuoNeb treatment in the ED along with steroid. I have low suspicion for PE or drugs allergies causing her symptoms.  8:05 AM Patient felt better after receiving breathing treatment. She ambulated while maintaining 100% oxygen on room air. Patient will be discharge with steroid, albuterol inhaler, and I will prescribe hydrochlorothiazide blood pressure medication for her high blood pressure. She will follow-up closely with her doctor for further care. Return precaution discussed.  Labs Review Labs Reviewed - No data to display  Imaging Review Dg Chest 2 View (if Patient Has Fever And/or Copd)  08/20/2014   CLINICAL DATA:  Subacute onset of cough, congestion, shortness of breath and wheezing.  Chest tightness. Initial encounter.  EXAM: CHEST  2 VIEW  COMPARISON:  Chest radiograph performed 03/03/2012  FINDINGS: The lungs are well-aerated. Mild right basilar opacity may reflect atelectasis or possibly mild pneumonia. There is no evidence of pleural effusion or pneumothorax.  The heart is normal in size; the mediastinal contour is within normal limits. No acute osseous abnormalities are seen.  IMPRESSION: Mild right basilar opacity may reflect atelectasis or possibly mild pneumonia.   Electronically Signed   By: Garald Balding M.D.   On: 08/20/2014 06:39     EKG Interpretation None      MDM   Final diagnoses:  Shortness of breath  Wheezes  Essential hypertension    BP 184/101 mmHg  Pulse 69  Temp(Src) 98.5 F (36.9 C) (Oral)  Resp 21  SpO2 100%  LMP 07/30/2014  I have reviewed nursing notes and vital signs. I personally viewed the imaging tests through PACS system and agrees with radiologist's intepretation I reviewed available ER/hospitalization records through the EMR     Domenic Moras, PA-C 08/20/14 [place], MD 08/20/14 2058

## 2014-08-20 NOTE — ED Notes (Signed)
Pt states that she has had a cough, congestion and feeling short of breath for 1 month; pt states that it has gotten progressively worse; pt states NP cough; pt states that she is short of breath; pt c/o wheezing; pt states that she doesn't know of it is worse with activity; pt states that she feels like she has to take a deep breathe every few breaths; pt c/o chest soreness from coughing and tightness

## 2014-08-20 NOTE — Discharge Instructions (Signed)
You have been evaluated for your shortness of breath and wheezing. This could be due to an underlying undiagnosed asthma or COPD. Use inhaler 2 puffs every 4 hrs as needed. Take steroid as prescribed.  Follow-up closely with provider for further care. Your blood pressure is high today, take the pressure medication as prescribed and have it rechecked by your doctor.  How to Use an Inhaler Proper inhaler technique is very important. Good technique ensures that the medicine reaches the lungs. Poor technique results in depositing the medicine on the tongue and back of the throat rather than in the airways. If you do not use the inhaler with good technique, the medicine will not help you. STEPS TO FOLLOW IF USING AN INHALER WITHOUT AN EXTENSION TUBE 1. Remove the cap from the inhaler. 2. If you are using the inhaler for the first time, you will need to prime it. Shake the inhaler for 5 seconds and release four puffs into the air, away from your face. Ask your health care provider or pharmacist if you have questions about priming your inhaler. 3. Shake the inhaler for 5 seconds before each breath in (inhalation). 4. Position the inhaler so that the top of the canister faces up. 5. Put your index finger on the top of the medicine canister. Your thumb supports the bottom of the inhaler. 6. Open your mouth. 7. Either place the inhaler between your teeth and place your lips tightly around the mouthpiece, or hold the inhaler 1-2 inches away from your open mouth. If you are unsure of which technique to use, ask your health care provider. 8. Breathe out (exhale) normally and as completely as possible. 9. Press the canister down with your index finger to release the medicine. 10. At the same time as the canister is pressed, inhale deeply and slowly until your lungs are completely filled. This should take 4-6 seconds. Keep your tongue down. 11. Hold the medicine in your lungs for 5-10 seconds (10 seconds is best).  This helps the medicine get into the small airways of your lungs. 12. Breathe out slowly, through pursed lips. Whistling is an example of pursed lips. 13. Wait at least 15-30 seconds between puffs. Continue with the above steps until you have taken the number of puffs your health care provider has ordered. Do not use the inhaler more than your health care provider tells you. 14. Replace the cap on the inhaler. 15. Follow the directions from your health care provider or the inhaler insert for cleaning the inhaler. STEPS TO FOLLOW IF USING AN INHALER WITH AN EXTENSION (SPACER) 1. Remove the cap from the inhaler. 2. If you are using the inhaler for the first time, you will need to prime it. Shake the inhaler for 5 seconds and release four puffs into the air, away from your face. Ask your health care provider or pharmacist if you have questions about priming your inhaler. 3. Shake the inhaler for 5 seconds before each breath in (inhalation). 4. Place the open end of the spacer onto the mouthpiece of the inhaler. 5. Position the inhaler so that the top of the canister faces up and the spacer mouthpiece faces you. 6. Put your index finger on the top of the medicine canister. Your thumb supports the bottom of the inhaler and the spacer. 7. Breathe out (exhale) normally and as completely as possible. 8. Immediately after exhaling, place the spacer between your teeth and into your mouth. Close your lips tightly around the spacer. 9.  Press the canister down with your index finger to release the medicine. 10. At the same time as the canister is pressed, inhale deeply and slowly until your lungs are completely filled. This should take 4-6 seconds. Keep your tongue down and out of the way. 11. Hold the medicine in your lungs for 5-10 seconds (10 seconds is best). This helps the medicine get into the small airways of your lungs. Exhale. 12. Repeat inhaling deeply through the spacer mouthpiece. Again hold that  breath for up to 10 seconds (10 seconds is best). Exhale slowly. If it is difficult to take this second deep breath through the spacer, breathe normally several times through the spacer. Remove the spacer from your mouth. 13. Wait at least 15-30 seconds between puffs. Continue with the above steps until you have taken the number of puffs your health care provider has ordered. Do not use the inhaler more than your health care provider tells you. 14. Remove the spacer from the inhaler, and place the cap on the inhaler. 15. Follow the directions from your health care provider or the inhaler insert for cleaning the inhaler and spacer. If you are using different kinds of inhalers, use your quick relief medicine to open the airways 10-15 minutes before using a steroid if instructed to do so by your health care provider. If you are unsure which inhalers to use and the order of using them, ask your health care provider, nurse, or respiratory therapist. If you are using a steroid inhaler, always rinse your mouth with water after your last puff, then gargle and spit out the water. Do not swallow the water. AVOID:  Inhaling before or after starting the spray of medicine. It takes practice to coordinate your breathing with triggering the spray.  Inhaling through the nose (rather than the mouth) when triggering the spray. HOW TO DETERMINE IF YOUR INHALER IS FULL OR NEARLY EMPTY You cannot know when an inhaler is empty by shaking it. A few inhalers are now being made with dose counters. Ask your health care provider for a prescription that has a dose counter if you feel you need that extra help. If your inhaler does not have a counter, ask your health care provider to help you determine the date you need to refill your inhaler. Write the refill date on a calendar or your inhaler canister. Refill your inhaler 7-10 days before it runs out. Be sure to keep an adequate supply of medicine. This includes making sure it is  not expired, and that you have a spare inhaler.  SEEK MEDICAL CARE IF:   Your symptoms are only partially relieved with your inhaler.  You are having trouble using your inhaler.  You have some increase in phlegm. SEEK IMMEDIATE MEDICAL CARE IF:   You feel little or no relief with your inhalers. You are still wheezing and are feeling shortness of breath or tightness in your chest or both.  You have dizziness, headaches, or a fast heart rate.  You have chills, fever, or night sweats.  You have a noticeable increase in phlegm production, or there is blood in the phlegm. MAKE SURE YOU:   Understand these instructions.  Will watch your condition.  Will get help right away if you are not doing well or get worse. Document Released: 02/11/2000 Document Revised: 12/04/2012 Document Reviewed: 09/12/2012 [facility] Patient Information 2015 [place], [place]. This information is not intended to replace advice given to you by your health care provider. Make sure you discuss  any questions you have with your health care provider. ° °

## 2014-09-01 ENCOUNTER — Encounter: Payer: Self-pay | Admitting: Family Medicine

## 2014-09-01 ENCOUNTER — Ambulatory Visit (INDEPENDENT_AMBULATORY_CARE_PROVIDER_SITE_OTHER): Payer: Self-pay | Admitting: Family Medicine

## 2014-09-01 VITALS — BP 124/84 | HR 100 | Temp 98.1°F | Ht 65.5 in | Wt 324.3 lb

## 2014-09-01 DIAGNOSIS — J189 Pneumonia, unspecified organism: Secondary | ICD-10-CM

## 2014-09-01 DIAGNOSIS — R21 Rash and other nonspecific skin eruption: Secondary | ICD-10-CM

## 2014-09-01 DIAGNOSIS — I10 Essential (primary) hypertension: Secondary | ICD-10-CM

## 2014-09-01 DIAGNOSIS — F39 Unspecified mood [affective] disorder: Secondary | ICD-10-CM

## 2014-09-01 MED ORDER — AZITHROMYCIN 250 MG PO TABS: ORAL_TABLET | ORAL | Status: DC

## 2014-09-01 MED ORDER — HYDROCHLOROTHIAZIDE 25 MG PO TABS: 25.0000 mg | ORAL_TABLET | Freq: Every day | ORAL | Status: DC

## 2014-09-01 NOTE — Progress Notes (Signed)
Pre visit review using our clinic review tool, if applicable. No additional management support is needed unless otherwise documented below in the visit note. 

## 2014-09-01 NOTE — Patient Instructions (Addendum)
BEFORE YOU LEAVE: -lab appointment next week -follow up appointment in 1 month -physical with pap in 3-4 months  Take the antibiotic as instructed  We recommend the following healthy lifestyle measures: - eat a healthy diet consisting of lots of vegetables, fruits, beans, nuts, seeds, healthy meats such as white chicken and fish and whole grains.  - avoid fried foods, fast food, processed foods, sodas, red meet and other fattening foods.  - get a least 150 minutes of aerobic exercise per week.   Take Blood pressure medication very day  Add Cerave Cream or Cetaphil Restoraderm for the skin

## 2014-09-01 NOTE — Progress Notes (Signed)
HPI:  Rebecca Collier is a pleasant 39 yo F with PMH HTN, Obesity and eczema whom established care > 2.5 years ago, but has not returned since.  She is here for ollow up of several issues:  SOB/cough/congestion: -started about 1 month ago with a cold -seen in ED 08/20/14 for cough, congestion, wheezing, SOB and treated for bronchitis with steroid and albuterol, xray with ? possible PNA, but per notes felt likely atelectasis and abx not given -persistent cough, tired, productive cough at time - yellow and brown  -SOB and wheezing have improved on prednisone but other symptoms remain -she wants to check for lupus as has face rash and was told in ED to see PCP to test for this - occ pain in knees   Hypertension/Obesity: -poor compliance with care -started on HCTZ in ED recently -reports: she wishes she did not have to take medicines for this -no regular exercise, diet is not great - not a big meat eater, does not snack a lot - but carbs and sodium are and issue -denies: CP, SOB, DOE, palpitations, HA  GAD, Social Anxiety: -only recently with change in job -generalized anxiety and some anxiety related to being in crowded place -denies: depression manic symptoms, panic attack  Eczema: -arms, legs, neck and face -she has used triamcinolone -not using emollient  ROS: See pertinent positives and negatives per HPI.  Past Medical History  Diagnosis Date  . Hypertension   . Obesity   . Eczema   . Sciatica     spasms daily in left leg     History reviewed. No pertinent past surgical history.  Family History  Problem Relation Age of Onset  . Colon cancer Mother     dx at age    History   Social History  . Marital Status: Single    Spouse Name: N/A  . Number of Children: N/A  . Years of Education: N/A   Social History Main Topics  . Smoking status: Never Smoker   . Smokeless tobacco: Not on file  . Alcohol Use: Yes     Comment: socially  . Drug Use: No  . Sexual  Activity: Not on file   Other Topics Concern  . 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  hydrochlorothiazide (HYDRODIURIL) 25 MG tablet, Take 1 tablet (25 mg total) by mouth daily., Disp: 90 tablet, Rfl: 3 .  Multiple Vitamin (MULTIVITAMIN WITH MINERALS) TABS tablet, Take 1 tablet by mouth daily., Disp: , Rfl:  .  triamcinolone cream (KENALOG) 0.1 %, APPLY 1 APPLICATION TOPICALLY 2 (TWO) TIMES DAILY., Disp: 30 g, Rfl: 1 .  vitamin B-12 (CYANOCOBALAMIN) 1000 MCG tablet, Take 1,000 mcg by mouth daily., Disp: , Rfl:  .  azithromycin (ZITHROMAX) 250 MG tablet, 2 tablets on the first day then one tablet daily, Disp: 6 tablet, Rfl: 0  EXAM:  Filed Vitals:   09/01/14 1301  BP: 124/84  Pulse: 100  Temp: 98.1 F (36.7 C)    Body mass index is 53.13 kg/(m^2).  GENERAL: vitals reviewed and listed above, morbid obesity, alert, oriented, appears well hydrated and in no acute distress  HEENT: atraumatic, conjunttiva clear, no obvious  abnormalities on inspection of external nose and ears  SKIN: dry skin with patches or erythematous dry skin throughout, facial hair  NECK: no obvious masses on inspection  LUNGS: clear to auscultation bilaterally, no wheezes, rales or rhonchi, good air movement  CV: HRRR, no peripheral edema  MS: moves all extremities without noticeable abnormality  PSYCH: pleasant and cooperative, no obvious depression or anxiety  ASSESSMENT AND PLAN:  Discussed the following assessment and plan:  CAP (community acquired pneumonia) - Plan: azithromycin (ZITHROMAX) 250 MG tablet -with persistent cough, productive of thick mucus and xray findings, tx with abx for possible mild CAP -follow up in 1 month with repeat cxr, possible pulm referral -lupus screen per  her request, though rash does not look like classic rash for this  Essential hypertension, benign - Plan: hydrochlorothiazide (HYDRODIURIL) 25 MG tablet -cont medication  Eczema: -steroid + emmolient  Facial rash, knee pain: -wants to check for lupus, will start with CBC, ANA, anti-ds DNA, Anit-sm and have her see rheum if any positive findings to suggest this -rash more likely exzema, itchy started in childhood  Morbid Obesity: -lifestyle recs, labs per orders  GAD: -mild, CBT advised  -Patient advised to return or notify a doctor immediately if symptoms worsen or persist or new concerns arise.  Patient Instructions  BEFORE YOU LEAVE: -lab appointment next week -follow up appointment in 1 month -physical with pap in 3-4 months  We recommend the following healthy lifestyle measures: - eat a healthy diet consisting of lots of vegetables, fruits, beans, nuts, seeds, healthy meats such as white chicken and fish and whole grains.  - avoid fried foods, fast food, processed foods, sodas, red meet and other fattening foods.  - get a least 150 minutes of aerobic exercise per week.   Take Blood pressure medication very day  Add Cerave Cream or Cetaphil Restoraderm for the skin     Dolorez Jeffrey R.

## 2014-09-08 ENCOUNTER — Other Ambulatory Visit (INDEPENDENT_AMBULATORY_CARE_PROVIDER_SITE_OTHER): Payer: Self-pay

## 2014-09-08 DIAGNOSIS — R21 Rash and other nonspecific skin eruption: Secondary | ICD-10-CM

## 2014-09-08 DIAGNOSIS — I10 Essential (primary) hypertension: Secondary | ICD-10-CM

## 2014-09-08 LAB — BASIC METABOLIC PANEL
BUN: 12 mg/dL (ref 6–23)
CHLORIDE: 102 meq/L (ref 96–112)
CO2: 27 mEq/L (ref 19–32)
Calcium: 10.2 mg/dL (ref 8.4–10.5)
Creatinine, Ser: 0.94 mg/dL (ref 0.40–1.20)
GFR: 85.3 mL/min (ref >60.00)
Glucose, Bld: 102 mg/dL — ABNORMAL HIGH (ref 70–99)
Potassium: 3.6 mEq/L (ref 3.5–5.1)
SODIUM: 137 meq/L (ref 135–145)

## 2014-09-08 LAB — LIPID PANEL
Cholesterol: 211 mg/dL — ABNORMAL HIGH (ref 0–200)
HDL: 56.8 mg/dL (ref >39.00)
LDL Cholesterol: 125 mg/dL — ABNORMAL HIGH (ref 0–99)
NONHDL: 154.2
Total CHOL/HDL Ratio: 4
Triglycerides: 145 mg/dL (ref 0.0–149.0)
VLDL: 29 mg/dL (ref 0.0–40.0)

## 2014-09-08 LAB — CBC WITH DIFFERENTIAL/PLATELET
BASOS ABS: 0 10*3/uL (ref 0.0–0.1)
BASOS PCT: 0.5 % (ref 0.0–3.0)
EOS ABS: 0.2 10*3/uL (ref 0.0–0.7)
EOS PCT: 3.6 % (ref 0.0–5.0)
HCT: 42.8 % (ref 36.0–46.0)
Hemoglobin: 14.4 g/dL (ref 12.0–15.0)
LYMPHS ABS: 2.6 10*3/uL (ref 0.7–4.0)
LYMPHS PCT: 39.5 % (ref 12.0–46.0)
MCHC: 33.7 g/dL (ref 30.0–36.0)
MCV: 86.9 fl (ref 78.0–100.0)
Monocytes Absolute: 0.4 10*3/uL (ref 0.1–1.0)
Monocytes Relative: 6.3 % (ref 3.0–12.0)
NEUTROS ABS: 3.3 10*3/uL (ref 1.4–7.7)
NEUTROS PCT: 50.1 % (ref 43.0–77.0)
Platelets: 254 10*3/uL (ref 150.0–400.0)
RBC: 4.93 Mil/uL (ref 3.87–5.11)
RDW: 13.8 % (ref 11.5–15.5)
WBC: 6.6 10*3/uL (ref 4.0–10.5)

## 2014-09-08 LAB — HEMOGLOBIN A1C: Hgb A1c MFr Bld: 5.8 % (ref 4.6–6.5)

## 2014-09-08 LAB — VITAMIN D 25 HYDROXY (VIT D DEFICIENCY, FRACTURES): VITD: 20.16 ng/mL — ABNORMAL LOW (ref 30.00–100.00)

## 2014-09-09 LAB — ANTI-SMITH ANTIBODY: ENA SM Ab Ser-aCnc: <1

## 2014-09-09 LAB — ANTI-DNA ANTIBODY, DOUBLE-STRANDED: ds DNA Ab: <1 IU/mL

## 2014-09-09 LAB — ANA: ANA: NEGATIVE

## 2014-10-07 ENCOUNTER — Ambulatory Visit (INDEPENDENT_AMBULATORY_CARE_PROVIDER_SITE_OTHER): Payer: Self-pay | Admitting: Family Medicine

## 2014-10-07 ENCOUNTER — Ambulatory Visit (INDEPENDENT_AMBULATORY_CARE_PROVIDER_SITE_OTHER)
Admission: RE | Admit: 2014-10-07 | Discharge: 2014-10-07 | Disposition: A | Discharge status: Home / Self Care | Payer: Self-pay | Source: Ambulatory Visit | Attending: Family Medicine | Admitting: Family Medicine

## 2014-10-07 ENCOUNTER — Encounter: Payer: Self-pay | Admitting: Family Medicine

## 2014-10-07 VITALS — BP 136/84 | HR 99 | Temp 98.8°F | Ht 65.5 in | Wt 323.9 lb

## 2014-10-07 DIAGNOSIS — J189 Pneumonia, unspecified organism: Secondary | ICD-10-CM

## 2014-10-07 DIAGNOSIS — E669 Obesity, unspecified: Secondary | ICD-10-CM

## 2014-10-07 DIAGNOSIS — R739 Hyperglycemia, unspecified: Secondary | ICD-10-CM

## 2014-10-07 DIAGNOSIS — I10 Essential (primary) hypertension: Secondary | ICD-10-CM

## 2014-10-07 DIAGNOSIS — E785 Hyperlipidemia, unspecified: Secondary | ICD-10-CM

## 2014-10-07 NOTE — Patient Instructions (Signed)
BEFORE YOU LEAVE: -schedule your physical with pap smear in 3 month, come fasting and we will do labs that day  We recommend the following healthy lifestyle measures: - eat a healthy diet consisting small portion of vegetables, fruits, beans, nuts, seeds, healthy meats such as white chicken and fish  - avoid starches, sweets, fried foods, fast food, processed foods, sodas, red meet and other fattening foods.  - get a least 150 minutes of aerobic exercise per week.

## 2014-10-07 NOTE — Progress Notes (Signed)
Pre visit review using our clinic review tool, if applicable. No additional management support is needed unless otherwise documented below in the visit note. 

## 2014-10-07 NOTE — Progress Notes (Signed)
HPI:  Follow up:  CAP: -treated with steroids and abx 08/2014 -reports: resolved, no cough or SOB -here to recheck CXR  Prediabetes/Hyperlipidemia/HTN/Obesity: -on recent labs -diet and exercise: no regular exercise, diet is not great -meds: HCTZ  Skin Rash: -reports: stable -lupus screening labs per her concerns done and all normal  GAD, Social Anxiety: -only recently with change in job -generalized anxiety and some anxiety related to being in crowded place -denies: depression manic symptoms, panic attack  ROS: See pertinent positives and negatives per HPI.  Past Medical History  Diagnosis Date  . Hypertension   . Obesity   . Eczema   . Sciatica     spasms daily in left leg     No past surgical history on file.  Family History  Problem Relation Age of Onset  . Colon cancer Mother     dx at age    Social History   Social History  . Marital Status: Single    Spouse Name: N/A  . Number of Children: N/A  . Years of Education: N/A   Social History Main Topics  . Smoking status: Never Smoker   . Smokeless tobacco: None  . Alcohol Use: Yes     Comment: socially  . Drug Use: No  . Sexual Activity: Not Asked   Other Topics Concern  . 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  hydrochlorothiazide (HYDRODIURIL) 25 MG tablet, Take 1 tablet (25 mg total) by mouth daily., Disp: 90 tablet, Rfl: 3 .  Multiple Vitamin (MULTIVITAMIN WITH MINERALS) TABS tablet, Take 1 tablet by mouth daily., Disp: , Rfl:  .  triamcinolone cream (KENALOG) 0.1 %, APPLY 1 APPLICATION TOPICALLY 2 (TWO) TIMES DAILY., Disp: 30 g, Rfl: 1 .  vitamin B-12 (CYANOCOBALAMIN) 1000 MCG tablet, Take 1,000 mcg by mouth daily., Disp: , Rfl:   EXAM:  Filed Vitals:   10/07/14 0858  BP:  136/84  Pulse: 99  Temp: 98.8 F (37.1 C)    Body mass index is 53.06 kg/(m^2).  GENERAL: vitals reviewed and listed above, alert, oriented, appears well hydrated and in no acute distress  HEENT: atraumatic, conjunttiva clear, no obvious abnormalities on inspection of external nose and ears  NECK: no obvious masses on inspection  LUNGS: clear to auscultation bilaterally, no wheezes, rales or rhonchi, good air movement  CV: HRRR, no peripheral edema  MS: moves all extremities without noticeable abnormality  PSYCH: pleasant and cooperative, no obvious depression or anxiety  ASSESSMENT AND PLAN:  Discussed the following assessment and plan:  CAP (community acquired pneumonia) - Plan: DG Chest 2 View  Obesity  Essential hypertension, benign  Hyperglycemia  Hyperlipemia  -counseled at length on lifestyle changes -has never had a pap and is very nervous about this - strongly encouraged to do, she plans to try at physical -Patient advised to return or notify a doctor immediately if symptoms worsen or persist or new concerns arise.  Patient Instructions  BEFORE YOU LEAVE: -schedule your physical with pap smear in 3 month, come fasting and we will do labs that day  We recommend the following healthy lifestyle measures: - eat a healthy diet consisting small portion of  vegetables, fruits, beans, nuts, seeds, healthy meats such as white chicken and fish  - avoid starches, sweets, fried foods, fast food, processed foods, sodas, red meet and other fattening foods.  - get a least 150 minutes of aerobic exercise per week.       Colin Benton R.

## 2014-11-20 ENCOUNTER — Ambulatory Visit (INDEPENDENT_AMBULATORY_CARE_PROVIDER_SITE_OTHER): Payer: Self-pay | Admitting: Family Medicine

## 2014-11-20 ENCOUNTER — Encounter: Payer: Self-pay | Admitting: Family Medicine

## 2014-11-20 VITALS — BP 140/84 | HR 79 | Temp 98.3°F | Ht 65.5 in | Wt 332.6 lb

## 2014-11-20 DIAGNOSIS — R194 Change in bowel habit: Secondary | ICD-10-CM

## 2014-11-20 DIAGNOSIS — I10 Essential (primary) hypertension: Secondary | ICD-10-CM

## 2014-11-20 DIAGNOSIS — R110 Nausea: Secondary | ICD-10-CM

## 2014-11-20 DIAGNOSIS — Z800 Family history of malignant neoplasm of digestive organs: Secondary | ICD-10-CM

## 2014-11-20 DIAGNOSIS — R1084 Generalized abdominal pain: Secondary | ICD-10-CM

## 2014-11-20 MED ORDER — LOSARTAN POTASSIUM-HCTZ 50-12.5 MG PO TABS: 1.0000 | ORAL_TABLET | Freq: Every day | ORAL | Status: DC

## 2014-11-20 MED ORDER — ESOMEPRAZOLE MAGNESIUM 20 MG PO CPDR
20.0000 mg | DELAYED_RELEASE_CAPSULE | Freq: Every day | ORAL | Status: DC
Start: 2014-11-20 — End: 2015-03-24

## 2014-11-20 NOTE — Patient Instructions (Signed)
BEFORE YOU LEAVE: -labs -schedule follow up in 1 month  Please stop the hydrochlorothiazide and start the new medication hctz/losartan once daily. If you find this is too expensive [facility] the [facility]. Please call if this is not affordable.  We can not retroactively provide FMLA for events we did not see you for. We do not anticipate you missing work for unexpected reasons related to the issues with are treating you for. If you feel you are unwell or your blood pressure is so high that you can not work then we advise that you see a doctor immediately.  Given your family history we have place a referral to our gastroenterologist regarding your change in bowel habits and the abdominal pain.  In the interim we advise nexium 20 mg daily for 2 weeks and Align probiotic daily for 1 month.

## 2014-11-20 NOTE — Progress Notes (Signed)
Pre visit review using our clinic review tool, if applicable. No additional management support is needed unless otherwise documented below in the visit note. 

## 2014-11-20 NOTE — Progress Notes (Signed)
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  Past Medical History  Diagnosis Date  . Hypertension   . Obesity   . Eczema   . Sciatica     spasms daily in left leg     No past surgical history on file.  Family History  Problem Relation Age of Onset  . Colon cancer Mother     dx at age    Social History   Social History  . Marital Status: Single    Spouse Name: N/A  . Number of Children: N/A  . Years of Education: N/A   Social History Main Topics  . Smoking status: Never Smoker   . Smokeless tobacco: None  . Alcohol Use: Yes     Comment: socially  . Drug Use: No  . Sexual Activity: Not Asked   Other Topics Concern  . 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  triamcinolone cream (KENALOG) 0.1 %, APPLY 1 APPLICATION TOPICALLY 2 (TWO) TIMES DAILY., Disp: 30 g, Rfl: 1 .  vitamin B-12 (CYANOCOBALAMIN) 1000 MCG tablet, Take 1,000 mcg by mouth daily., Disp: , Rfl:  .   esomeprazole (NEXIUM) 20 MG capsule, Take 1 capsule (20 mg total) by mouth daily at 12 noon., Disp: 14 capsule, Rfl: 0 .  losartan-hydrochlorothiazide (HYZAAR) 50-12.5 MG per tablet, Take 1 tablet by mouth daily., Disp: 90 tablet, Rfl: 3  EXAM:  Filed Vitals:   11/20/14 1114  BP: 140/84  Pulse: 79  Temp: 98.3 F (36.8 C)    Body mass index is 54.49 kg/(m^2).  GENERAL: vitals reviewed and listed above, alert, oriented, appears well hydrated and in no acute distress  HEENT: atraumatic, conjunttiva clear, no obvious abnormalities on inspection of external nose and ears  NECK: no obvious masses on inspection  LUNGS: clear to auscultation bilaterally, no wheezes, rales or rhonchi, good air movement  CV: HRRR, no peripheral edema  MS: moves all extremities without noticeable abnormality  PSYCH: pleasant and cooperative, no obvious depression or anxiety  ASSESSMENT AND PLAN:  Discussed the following assessment and plan:  Essential hypertension, benign - Plan: losartan-hydrochlorothiazide (HYZAAR) 50-12.5 MG per tablet, DISCONTINUED: losartan-hydrochlorothiazide (HYZAAR) 50-12.5 MG per tablet -her blood pressure has actually been fairly well controlled here, borderline on first check here today -she insists has been up so will make change in BP medication - advised to call immediately if not  affordable -can not do FMLA for events we did not see her for  Generalized abdominal pain - Plan: Ambulatory referral to Gastroenterology Family history of colon cancer in mother (age 59) - Plan: Ambulatory referral to Gastroenterology Nausea without vomiting - Plan: H. pylori Antibody, IgG, Ambulatory referral to Gastroenterology, esomeprazole (NEXIUM) 20 MG capsule Change in bowel habits - Plan: Gliadin IgA+tTG IgA, Ambulatory referral to Gastroenterology -this sound like it may be a combination of GERD and IBs, but given FH will also have her see GI   -Patient advised to return or notify a  doctor immediately if symptoms worsen or persist or new concerns arise.  Patient Instructions  BEFORE YOU LEAVE: -labs -schedule follow up in 1 month  Please stop the hydrochlorothiazide and start the new medication hctz/losartan once daily. If you find this is too expensive [facility]. Please call if this is not affordable.  We can not retroactively provide FMLA for events we did not see you for. We do not anticipate you missing work for unexpected reasons related to the issues with are treating you for. If you feel you are unwell or your blood pressure is so high that you can not work then we advise that you see a doctor immediately.  Given your family history we have place a referral to our gastroenterologist regarding your change in bowel habits and the abdominal pain.  In the interim we advise nexium 20 mg daily for 2 weeks and Align probiotic daily for 1 month.       Colin Benton R.

## 2014-11-24 ENCOUNTER — Other Ambulatory Visit: Payer: Self-pay

## 2014-12-30 ENCOUNTER — Other Ambulatory Visit: Payer: Self-pay | Admitting: Family Medicine

## 2015-01-12 ENCOUNTER — Encounter: Payer: Self-pay | Admitting: Family Medicine

## 2015-03-24 ENCOUNTER — Ambulatory Visit (INDEPENDENT_AMBULATORY_CARE_PROVIDER_SITE_OTHER): Payer: Managed Care, Other (non HMO) | Admitting: Family Medicine

## 2015-03-24 ENCOUNTER — Encounter: Payer: Self-pay | Admitting: Family Medicine

## 2015-03-24 VITALS — BP 116/82 | HR 88 | Temp 98.7°F | Ht 65.5 in | Wt 332.0 lb

## 2015-03-24 DIAGNOSIS — R519 Headache, unspecified: Secondary | ICD-10-CM

## 2015-03-24 DIAGNOSIS — I10 Essential (primary) hypertension: Secondary | ICD-10-CM

## 2015-03-24 DIAGNOSIS — R51 Headache: Secondary | ICD-10-CM | POA: Diagnosis not present

## 2015-03-24 DIAGNOSIS — R194 Change in bowel habit: Secondary | ICD-10-CM

## 2015-03-24 DIAGNOSIS — Z800 Family history of malignant neoplasm of digestive organs: Secondary | ICD-10-CM

## 2015-03-24 LAB — CBC WITH DIFFERENTIAL/PLATELET
Basophils Absolute: 0 10*3/uL (ref 0.0–0.1)
Basophils Relative: 0.8 % (ref 0.0–3.0)
EOS ABS: 0.2 10*3/uL (ref 0.0–0.7)
EOS PCT: 3.7 % (ref 0.0–5.0)
HEMATOCRIT: 42.5 % (ref 36.0–46.0)
HEMOGLOBIN: 14 g/dL (ref 12.0–15.0)
LYMPHS PCT: 39 % (ref 12.0–46.0)
Lymphs Abs: 2.3 10*3/uL (ref 0.7–4.0)
MCHC: 32.8 g/dL (ref 30.0–36.0)
MCV: 86.3 fl (ref 78.0–100.0)
MONO ABS: 0.5 10*3/uL (ref 0.1–1.0)
Monocytes Relative: 9 % (ref 3.0–12.0)
Neutro Abs: 2.7 10*3/uL (ref 1.4–7.7)
Neutrophils Relative %: 47.5 % (ref 43.0–77.0)
Platelets: 287 10*3/uL (ref 150.0–400.0)
RBC: 4.92 Mil/uL (ref 3.87–5.11)
RDW: 14 % (ref 11.5–15.5)
WBC: 5.8 10*3/uL (ref 4.0–10.5)

## 2015-03-24 LAB — BASIC METABOLIC PANEL
BUN: 12 mg/dL (ref 6–23)
CHLORIDE: 106 meq/L (ref 96–112)
CO2: 28 meq/L (ref 19–32)
Calcium: 9.8 mg/dL (ref 8.4–10.5)
Creatinine, Ser: 0.79 mg/dL (ref 0.40–1.20)
GFR: 103.96 mL/min (ref >60.00)
Glucose, Bld: 90 mg/dL (ref 70–99)
POTASSIUM: 3.9 meq/L (ref 3.5–5.1)
Sodium: 139 mEq/L (ref 135–145)

## 2015-03-24 LAB — HEMOGLOBIN A1C: HEMOGLOBIN A1C: 5.9 % (ref 4.6–6.5)

## 2015-03-24 LAB — CHOLESTEROL, TOTAL: Cholesterol: 205 mg/dL — ABNORMAL HIGH (ref 0–200)

## 2015-03-24 LAB — TSH: TSH: 1.01 u[IU]/mL (ref 0.35–4.50)

## 2015-03-24 LAB — HDL CHOLESTEROL: HDL: 57.8 mg/dL (ref >39.00)

## 2015-03-24 NOTE — Progress Notes (Signed)
Pre visit review using our clinic review tool, if applicable. No additional management support is needed unless otherwise documented below in the visit note. 

## 2015-03-24 NOTE — Patient Instructions (Signed)
BEFORE YOU LEAVE: -schedule physical and follow up in 1 month -labs -headache journal  Call eye doctor today to schedule appointment for evaluation   Call gastroenterologist today to schedule appointment regarding abdominal pain and bowel changes  Limit use of excedrin to 2-3 days per week; do not use other over the counter pain medications - ok to use topical menthol products such a tiger balm - avoid application in or very near eyes  Keep a headache journal

## 2015-03-24 NOTE — Progress Notes (Signed)
HPI:  Rebecca Collier is a 40 yo F with a PMH of HTN, Obesity, and poor compliance here for an acute visit for:  Headache: -started about 2-3 weeks ago -alternating moderate headaches around eyes -occur almost daily, no headache today -relieved by Excedrin or rest -no precipitating factor or timing other then that sometime occur after looking at print -is stressed at work but headaches can occur at work and at home, poor sleep chronically -deines: fevers, chills, sinus congestion, known vision changes, weakness, numbness, aura, nausea, vomiting, photophobia, phonophobia   Follow up:  Last visit advised GI referral for change in bowel habits and abd pain and GERD. Was supposed to see GI and follow up in 1 month. She reports she did not get messages from their office and did not follow up because of insurance issues. Now has insurance and agrees to schedule eval as continues to have same issues. No weight loss, fever, malaise.  Increased BP medication to losartan-hctz 50-12.5 last visit. Was supposed to follow up in 1 month. She reports doing well. No CP, SOB, swelling.  ROS: See pertinent positives and negatives per HPI.  Past Medical History  Diagnosis Date  . Hypertension   . Obesity   . Eczema   . Sciatica     spasms daily in left leg     No past surgical history on file.  Family History  Problem Relation Age of Onset  . Colon cancer Mother     dx at age    Social History   Social History  . Marital Status: Single    Spouse Name: N/A  . Number of Children: N/A  . Years of Education: N/A   Social History Main Topics  . Smoking status: Never Smoker   . Smokeless tobacco: None  . Alcohol Use: Yes     Comment: socially  . Drug Use: No  . Sexual Activity: Not Asked   Other Topics Concern  . 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  losartan-hydrochlorothiazide (HYZAAR) 50-12.5 MG per tablet, Take 1 tablet by mouth daily., Disp: 90 tablet, Rfl: 3 .  Omega-3 Fatty Acids (FISH OIL PO), Take by mouth., Disp: , Rfl:  .  triamcinolone cream (KENALOG) 0.1 %, APPLY 1 APPLICATION TOPICALLY 2 (TWO) TIMES DAILY., Disp: 30 g, Rfl: 2 .  vitamin B-12 (CYANOCOBALAMIN) 1000 MCG tablet, Take 1,000 mcg by mouth daily., Disp: , Rfl:   EXAM:  Filed Vitals:   03/24/15 0954  BP: 116/82  Pulse: 88  Temp: 98.7 F (37.1 C)    Body mass index is 54.39 kg/(m^2).  GENERAL: vitals reviewed and listed above, alert, oriented, appears well hydrated and in no acute distress  HEENT: atraumatic, conjunttiva clear, PERRLA, visual acuity grossly intact, EOMI, no obvious abnormalities on inspection of external nose and ears, no bruit or TA TTP  NECK: no obvious masses on inspection, supple  LUNGS: clear to auscultation bilaterally, no wheezes, rales or rhonchi, good air movement  CV: HRRR, no peripheral edema  MS: moves all extremities without noticeable abnormality  PSYCH/NEURO: pleasant and cooperative, no obvious depression or anxiety, speech and thought processing grossly intact, CN II-XII grossly in  tact, finger to nose normal  ASSESSMENT AND PLAN:  Discussed the following assessment and plan:  Frequent headaches - Plan: TSH, CBC with Differential -we discussed possible serious and likely etiologies, workup and treatment, treatment risks and return precautions - suspect tension type headaches vs eye strain -after this discussion, [name] opted for labs, limit analgesics to avoid rebound, journal, eye exam and close follow up and neuroimaging if persists -follow up advised in 1 month -of course, we advised [name]  to return or notify a doctor immediately if symptoms worsen or persist or new concerns arise.  Essential hypertension, benign - Plan: Basic  metabolic panel  Family history of colon cancer in mother (age 50) Change in bowel habits -referred to GI  -advised assistant to give her GI number to reschedule eval   Morbid obesity, unspecified obesity type ([place]) - Plan: Hemoglobin A1c, Cholesterol, Total, HDL cholesterol  -advised to schedule CPE/follow up in 1 month -Patient advised to return or notify a doctor immediately if symptoms worsen or persist or new concerns arise.  Patient Instructions  BEFORE YOU LEAVE: -schedule physical and follow up in 1 month -labs -headache journal  Call eye doctor today to schedule appointment for evaluation   Call gastroenterologist today to schedule appointment regarding abdominal pain and bowel changes  Limit use of excedrin to 2-3 days per week; do not use other over the counter pain medications - ok to use topical menthol products such a tiger balm - avoid application in or very near eyes  Keep a headache journal     Colin Benton R.

## 2015-03-30 ENCOUNTER — Encounter: Payer: Self-pay | Admitting: Gastroenterology

## 2015-04-20 ENCOUNTER — Ambulatory Visit (INDEPENDENT_AMBULATORY_CARE_PROVIDER_SITE_OTHER): Payer: Managed Care, Other (non HMO) | Admitting: Family Medicine

## 2015-04-20 ENCOUNTER — Encounter: Payer: Self-pay | Admitting: Family Medicine

## 2015-04-20 ENCOUNTER — Ambulatory Visit (INDEPENDENT_AMBULATORY_CARE_PROVIDER_SITE_OTHER)
Admission: RE | Admit: 2015-04-20 | Discharge: 2015-04-20 | Disposition: A | Discharge status: Home / Self Care | Payer: Managed Care, Other (non HMO) | Source: Ambulatory Visit | Attending: Family Medicine | Admitting: Family Medicine

## 2015-04-20 VITALS — BP 140/90 | HR 84 | Temp 98.1°F | Ht 65.5 in | Wt 340.9 lb

## 2015-04-20 DIAGNOSIS — I10 Essential (primary) hypertension: Secondary | ICD-10-CM | POA: Diagnosis not present

## 2015-04-20 DIAGNOSIS — J0100 Acute maxillary sinusitis, unspecified: Secondary | ICD-10-CM

## 2015-04-20 DIAGNOSIS — M25562 Pain in left knee: Secondary | ICD-10-CM | POA: Diagnosis not present

## 2015-04-20 MED ORDER — DOXYCYCLINE HYCLATE 100 MG PO TABS: 100.0000 mg | ORAL_TABLET | Freq: Two times a day (BID) | ORAL | Status: DC

## 2015-04-20 NOTE — Progress Notes (Signed)
Pre visit review using our clinic review tool, if applicable. No additional management support is needed unless otherwise documented below in the visit note. 

## 2015-04-20 NOTE — Progress Notes (Signed)
HPI:  Rebecca Collier is a 40 year old female here for an acute visit for several issues:   #1 ) sinus infection:  -Started 1-1/2-2 weeks ago  -symptoms include nasal congestion , postnasal drip, cough and now maxillary sinus pain and pressure that is worse with bending forward  - denies fevers , shortness of breath, body aches, nausea, vomiting or diarrhea    #2 )  Left knee pain :  - started 2 months ago   - symptoms include intermittent anterior left knee pain that is worse with stairs and better with Aleve and rest   - denies known trauma, change in activity, weakness, giving away, numbness, swelling or malaise or fevers   #3) Hypertension:   - blood pressure elevated on arrival  - she reports she did not take her blood pressure medications today  -Asymptomatic  ROS: See pertinent positives and negatives per HPI.  Past Medical History  Diagnosis Date  . Hypertension   . Obesity   . Eczema   . Sciatica     spasms daily in left leg     No past surgical history on file.  Family History  Problem Relation Age of Onset  . Colon cancer Mother     dx at age    Social History   Social History  . Marital Status: Single    Spouse Name: N/A  . Number of Children: N/A  . Years of Education: N/A   Social History Main Topics  . Smoking status: Never Smoker   . Smokeless tobacco: None  . Alcohol Use: Yes     Comment: socially  . Drug Use: No  . Sexual Activity: Not Asked   Other Topics Concern  . 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  losartan-hydrochlorothiazide (HYZAAR) 50-12.5 MG per tablet, Take 1 tablet by mouth daily., Disp: 90 tablet, Rfl: 3 .  Omega-3 Fatty Acids (FISH OIL PO), Take by mouth., Disp: , Rfl:  .  triamcinolone cream (KENALOG) 0.1 %,  APPLY 1 APPLICATION TOPICALLY 2 (TWO) TIMES DAILY., Disp: 30 g, Rfl: 2 .  vitamin B-12 (CYANOCOBALAMIN) 1000 MCG tablet, Take 1,000 mcg by mouth daily., Disp: , Rfl:  .  doxycycline (VIBRA-TABS) 100 MG tablet, Take 1 tablet (100 mg total) by mouth 2 (two) times daily., Disp: 20 tablet, Rfl: 0  EXAM:  Filed Vitals:   04/20/15 0904 04/20/15 0931  BP: 138/100 140/90  Pulse: 84   Temp: 98.1 F (36.7 C)     Body mass index is 55.85 kg/(m^2).  GENERAL: vitals reviewed and listed above, morbidly obese, alert, oriented, appears well hydrated and in no acute distress  HEENT: atraumatic, conjunttiva clear, no obvious abnormalities on inspection of external nose and ears, normal appearance of ear canals and TMs,  The white nasal congestion, mild post oropharyngeal erythema with PND, no tonsillar edema or exudate, no sinus TTP  NECK: no obvious masses on inspection  LUNGS: clear to auscultation bilaterally, no wheezes, rales or rhonchi, good air movement  CV: HRRR, no peripheral edema  MS: moves all extremities without noticeable abnormality , normal gait , normal inspection of both knees without redness , swelling or effusion appreciated  on exam today , some tenderness to palpation on the patella tendon , no other appreciable tenderness on exam , on detailed exam of the left knee negative anterior and posterior drawer, negative Lockman's , negative McMurray , negative valgus and varus stress , neurovascularly intact distally  PSYCH: pleasant and cooperative, no obvious depression or anxiety  ASSESSMENT AND PLAN:  Discussed the following assessment and plan:  Acute maxillary sinusitis, recurrence not specified  -suspect sinusitis   -treat with antibiotic , discussed risk and return precautions  Left knee pain - Plan: DG Knee Complete 4 Views Left  -suspect patellar tendinitis   - given duration of symptoms we'll obtain plain films   - home exercise program , short course of  anti-inflammatory medication after discussion of risk , supportive care and close follow-up advised  Essential hypertension, benign  -recheck  after sitting  -asymptomatic, will plan to recheck at follow-up as she did not take her blood pressure medications today  -Patient advised to return or notify a doctor immediately if symptoms worsen or persist or new concerns arise.  Patient Instructions   Before you leave:   - patella tendinitis knee exercises   - recheck blood pressure   -  X-ray sheet  -  Schedule follow-up in 1 month for your blood pressure and the knee pain   Please take the antibiotic as prescribed for the sinus infection. Please follow up if sinus symptoms  Worsen or persist despite treatment.    Please use ice for the knee  Pain and Aleve as needed per instructions.  Please get x-ray. Please do the home exercises 3 days per week.   Please take her blood pressure medications every day in the morning.     Colin Benton R.

## 2015-04-20 NOTE — Patient Instructions (Signed)
Before you leave:   - patella tendinitis knee exercises   - recheck blood pressure   -  X-ray sheet  -  Schedule follow-up in 1 month for your blood pressure and the knee pain   Please take the antibiotic as prescribed for the sinus infection. Please follow up if sinus symptoms  Worsen or persist despite treatment.    Please use ice for the knee  Pain and Aleve as needed per instructions.  Please get x-ray. Please do the home exercises 3 days per week.   Please take her blood pressure medications every day in the morning.

## 2015-04-26 ENCOUNTER — Ambulatory Visit: Payer: Managed Care, Other (non HMO) | Admitting: Family Medicine

## 2015-05-14 ENCOUNTER — Ambulatory Visit (INDEPENDENT_AMBULATORY_CARE_PROVIDER_SITE_OTHER): Payer: Managed Care, Other (non HMO) | Admitting: Family Medicine

## 2015-05-14 DIAGNOSIS — R69 Illness, unspecified: Secondary | ICD-10-CM

## 2015-05-14 NOTE — Progress Notes (Signed)
NO CPE

## 2015-05-21 ENCOUNTER — Ambulatory Visit: Payer: Managed Care, Other (non HMO) | Admitting: Gastroenterology

## 2015-05-24 ENCOUNTER — Ambulatory Visit (INDEPENDENT_AMBULATORY_CARE_PROVIDER_SITE_OTHER): Payer: Managed Care, Other (non HMO) | Admitting: Family Medicine

## 2015-05-24 DIAGNOSIS — R69 Illness, unspecified: Secondary | ICD-10-CM

## 2015-05-24 NOTE — Progress Notes (Signed)
NO SHOW

## 2015-05-27 ENCOUNTER — Emergency Department (HOSPITAL_COMMUNITY)
Admission: EM | Admit: 2015-05-27 | Discharge: 2015-05-27 | Disposition: A | Discharge status: Home / Self Care | Payer: Managed Care, Other (non HMO) | Attending: Physician Assistant | Admitting: Physician Assistant

## 2015-05-27 ENCOUNTER — Encounter (HOSPITAL_COMMUNITY): Payer: Self-pay

## 2015-05-27 DIAGNOSIS — Z792 Long term (current) use of antibiotics: Secondary | ICD-10-CM | POA: Insufficient documentation

## 2015-05-27 DIAGNOSIS — I10 Essential (primary) hypertension: Secondary | ICD-10-CM | POA: Diagnosis not present

## 2015-05-27 DIAGNOSIS — Z880 Allergy status to penicillin: Secondary | ICD-10-CM | POA: Insufficient documentation

## 2015-05-27 DIAGNOSIS — Z79899 Other long term (current) drug therapy: Secondary | ICD-10-CM | POA: Diagnosis not present

## 2015-05-27 DIAGNOSIS — M419 Scoliosis, unspecified: Secondary | ICD-10-CM | POA: Diagnosis not present

## 2015-05-27 DIAGNOSIS — Z872 Personal history of diseases of the skin and subcutaneous tissue: Secondary | ICD-10-CM | POA: Diagnosis not present

## 2015-05-27 DIAGNOSIS — M25562 Pain in left knee: Secondary | ICD-10-CM | POA: Diagnosis present

## 2015-05-27 DIAGNOSIS — E669 Obesity, unspecified: Secondary | ICD-10-CM | POA: Diagnosis not present

## 2015-05-27 DIAGNOSIS — Z7952 Long term (current) use of systemic steroids: Secondary | ICD-10-CM | POA: Diagnosis not present

## 2015-05-27 MED ORDER — NAPROXEN 500 MG PO TABS: 500.0000 mg | ORAL_TABLET | Freq: Two times a day (BID) | ORAL | Status: DC

## 2015-05-27 NOTE — ED Provider Notes (Signed)
CSN: [id number]     Arrival date & time 05/27/15  0911 History   First MD Initiated Contact with Patient 05/27/15 1127     Chief Complaint  Patient presents with  . Leg Pain     (Consider location/radiation/quality/duration/timing/severity/associated sxs/prior Treatment) HPI  40 year old obese female with history of hypertension, and sciatica affecting her left leg presenting with complaints of left leg pain. Patient states for the past 3 months she has had recurrent pain to her left knee. She described pain as sharp achy sensation, worsening with bending or straightening her knee and worsen with movement. Pain now radiates up towards her thigh and down towards her lower leg. Pain is moderate in intensity. No associated fever, significant back pain, bowel bladder incontinence, leg swelling, warmth or rash. She denies any recent injury. She has been using BenGay, and so in the in Epsom salts with some improvement. She was seen by her PCP a month ago for this knee pain. Patient states she had x-ray of the left knee and was told that she has arthritis but did not receive any specific treatment. She denies any prior history of PE or DVT, no recent surgery, prolonged bed rest, active cancer or hemoptysis.  Past Medical History  Diagnosis Date  . Hypertension   . Obesity   . Eczema   . Sciatica     spasms daily in left leg    History reviewed. No pertinent past surgical history. Family History  Problem Relation Age of Onset  . Colon cancer Mother     dx at age   Social History  Substance Use Topics  . Smoking status: Never Smoker   . Smokeless tobacco: None  . Alcohol Use: Yes     Comment: socially   OB History    No data available     Review of Systems  Constitutional: Negative for fever.  Musculoskeletal: Positive for arthralgias.  Skin: Negative for rash and wound.  Neurological: Negative for numbness.      Allergies  Penicillins  Home Medications   Prior to  Admission medications   Medication Sig Start Date End Date Taking? Authorizing Provider  doxycycline (VIBRA-TABS) 100 MG tablet Take 1 tablet (100 mg total) by mouth 2 (two) times daily. 04/20/15   Lucretia Kern, DO  losartan-hydrochlorothiazide (HYZAAR) 50-12.5 MG per tablet Take 1 tablet by mouth daily. 11/20/14   Lucretia Kern, DO  Omega-3 Fatty Acids (FISH OIL PO) Take by mouth.    Historical Provider, MD  triamcinolone cream (KENALOG) 0.1 % APPLY 1 APPLICATION TOPICALLY 2 (TWO) TIMES DAILY. 12/30/14   Lucretia Kern, DO  vitamin B-12 (CYANOCOBALAMIN) 1000 MCG tablet Take 1,000 mcg by mouth daily.    Historical Provider, MD   BP 172/100 mmHg  Pulse 86  Temp(Src) 98.2 F (36.8 C) (Oral)  Resp 18  SpO2 100%  LMP 05/07/2015 (Approximate) Physical Exam  Constitutional: She appears well-developed and well-nourished. No distress.  Obese female, nad  HENT:  Head: Atraumatic.  Eyes: Conjunctivae are normal.  Neck: Neck supple.  Musculoskeletal: She exhibits tenderness (L knee: mild tenderness to medial and lateral joint line as well as anterior patella without overlying skin changes, crepitus, deformity, swelling, or limited ROM.  ).  No significant midline spine tenderness, crepitus, or step-off  BLE without palpable cords, erythema, or edema.  Intact distal pedal pulses, normal skin tone.  Negative straight leg raise to LLE.   Neurological: She is alert.  Skin: No rash noted.  Psychiatric: She has a normal mood and affect.  Nursing note and vitals reviewed.   ED Course  Procedures (including critical care time) Labs Review Labs Reviewed - No data to display  Imaging Review No results found. I have personally reviewed and evaluated these images and lab results as part of my medical decision-making.   EKG Interpretation None      MDM   Final diagnoses:  Arthralgia of left knee    BP 172/100 mmHg  Pulse 86  Temp(Src) 98.2 F (36.8 C) (Oral)  Resp 18  SpO2 100%  LMP  05/07/2015 (Approximate) The patient was noted to be hypertensive today in the emergency department. I have spoken with the patient regarding hypertension and the need for improved management. I instructed the patient to followup with the Primary care doctor within 4 days to improve the management of the patient's hypertension. I also counseled the patient regarding the signs and symptoms which would require an emergent visit to an emergency department for hypertensive urgency and/or hypertensive emergency. The patient understood the need for improved hypertensive management.   11:59 AM L knee pain, likely arthralgias, doubt DVT or septic joint.  Pt ambulate without difficulty.  Doubt sciatica.  Will provide sxs treatment.  Ortho referral given.  Return precaution discussed. Knee ace wrap provided.    Domenic Moras, PA-C 05/27/15 [place], MD 05/27/15 1609

## 2015-05-27 NOTE — Discharge Instructions (Signed)
Knee Pain Knee pain is a very common symptom and can have many causes. Knee pain often goes away when you follow your health care provider's instructions for relieving pain and discomfort at home. However, knee pain can develop into a condition that needs treatment. Some conditions may include:  Arthritis caused by wear and tear (osteoarthritis).  Arthritis caused by swelling and irritation (rheumatoid arthritis or gout).  A cyst or growth in your knee.  An infection in your knee joint.  An injury that will not heal.  Damage, swelling, or irritation of the tissues that support your knee (torn ligaments or tendinitis). If your knee pain continues, additional tests may be ordered to diagnose your condition. Tests may include X-rays or other imaging studies of your knee. You may also need to have fluid removed from your knee. Treatment for ongoing knee pain depends on the cause, but treatment may include:  Medicines to relieve pain or swelling.  Steroid injections in your knee.  Physical therapy.  Surgery. HOME CARE INSTRUCTIONS  Take medicines only as directed by your health care provider.  Rest your knee and keep it raised (elevated) while you are resting.  Do not do things that cause or worsen pain.  Avoid high-impact activities or exercises, such as running, jumping rope, or doing jumping jacks.  Apply ice to the knee area:  Put ice in a plastic bag.  Place a towel between your skin and the bag.  Leave the ice on for 20 minutes, 2-3 times a day.  Ask your health care provider if you should wear an elastic knee support.  Keep a pillow under your knee when you sleep.  Lose weight if you are overweight. Extra weight can put pressure on your knee.  Do not use any tobacco products, including cigarettes, chewing tobacco, or electronic cigarettes. If you need help quitting, ask your health care provider. Smoking may slow the healing of any bone and joint problems that you may  have. SEEK MEDICAL CARE IF:  Your knee pain continues, changes, or gets worse.  You have a fever along with knee pain.  Your knee buckles or locks up.  Your knee becomes more swollen. SEEK IMMEDIATE MEDICAL CARE IF:   Your knee joint feels hot to the touch.  You have chest pain or trouble breathing.   This information is not intended to replace advice given to you by your health care provider. Make sure you discuss any questions you have with your health care provider.   Document Released: 12/11/2006 Document Revised: 03/06/2014 Document Reviewed: 09/29/2013 Elsevier Interactive Patient Education [employer].

## 2015-05-27 NOTE — ED Notes (Signed)
Pt presents with c/o left leg pain. Pt reports that the pain initially started 3 months ago and her doctor diagnosed her with arthritis. Pt reports the pain has now moved down her leg. Pt denies any swelling or redness.

## 2015-07-16 ENCOUNTER — Ambulatory Visit: Payer: Managed Care, Other (non HMO) | Admitting: Gastroenterology

## 2015-11-24 ENCOUNTER — Other Ambulatory Visit: Payer: Self-pay | Admitting: Family Medicine

## 2016-08-30 ENCOUNTER — Other Ambulatory Visit: Payer: Self-pay | Admitting: Family Medicine

## 2016-11-16 ENCOUNTER — Encounter: Payer: Self-pay | Admitting: Family Medicine

## 2016-11-21 ENCOUNTER — Other Ambulatory Visit: Payer: Self-pay | Admitting: Family Medicine

## 2017-03-02 ENCOUNTER — Other Ambulatory Visit: Payer: Self-pay | Admitting: Family Medicine

## 2017-03-05 ENCOUNTER — Other Ambulatory Visit: Payer: Self-pay | Admitting: Family Medicine

## 2017-03-08 ENCOUNTER — Encounter: Payer: Self-pay | Admitting: Family Medicine

## 2017-03-20 ENCOUNTER — Other Ambulatory Visit: Payer: Self-pay | Admitting: Family Medicine

## 2017-04-01 IMAGING — CR DG CHEST 2V
2 series · 2 of 2 positions shown · non-contrast
Comparison: Chest x-ray of 08/20/2014

CLINICAL DATA: Pneumonia, followup

EXAM:
CHEST  2 VIEW

[view not recorded (1 of 2)]
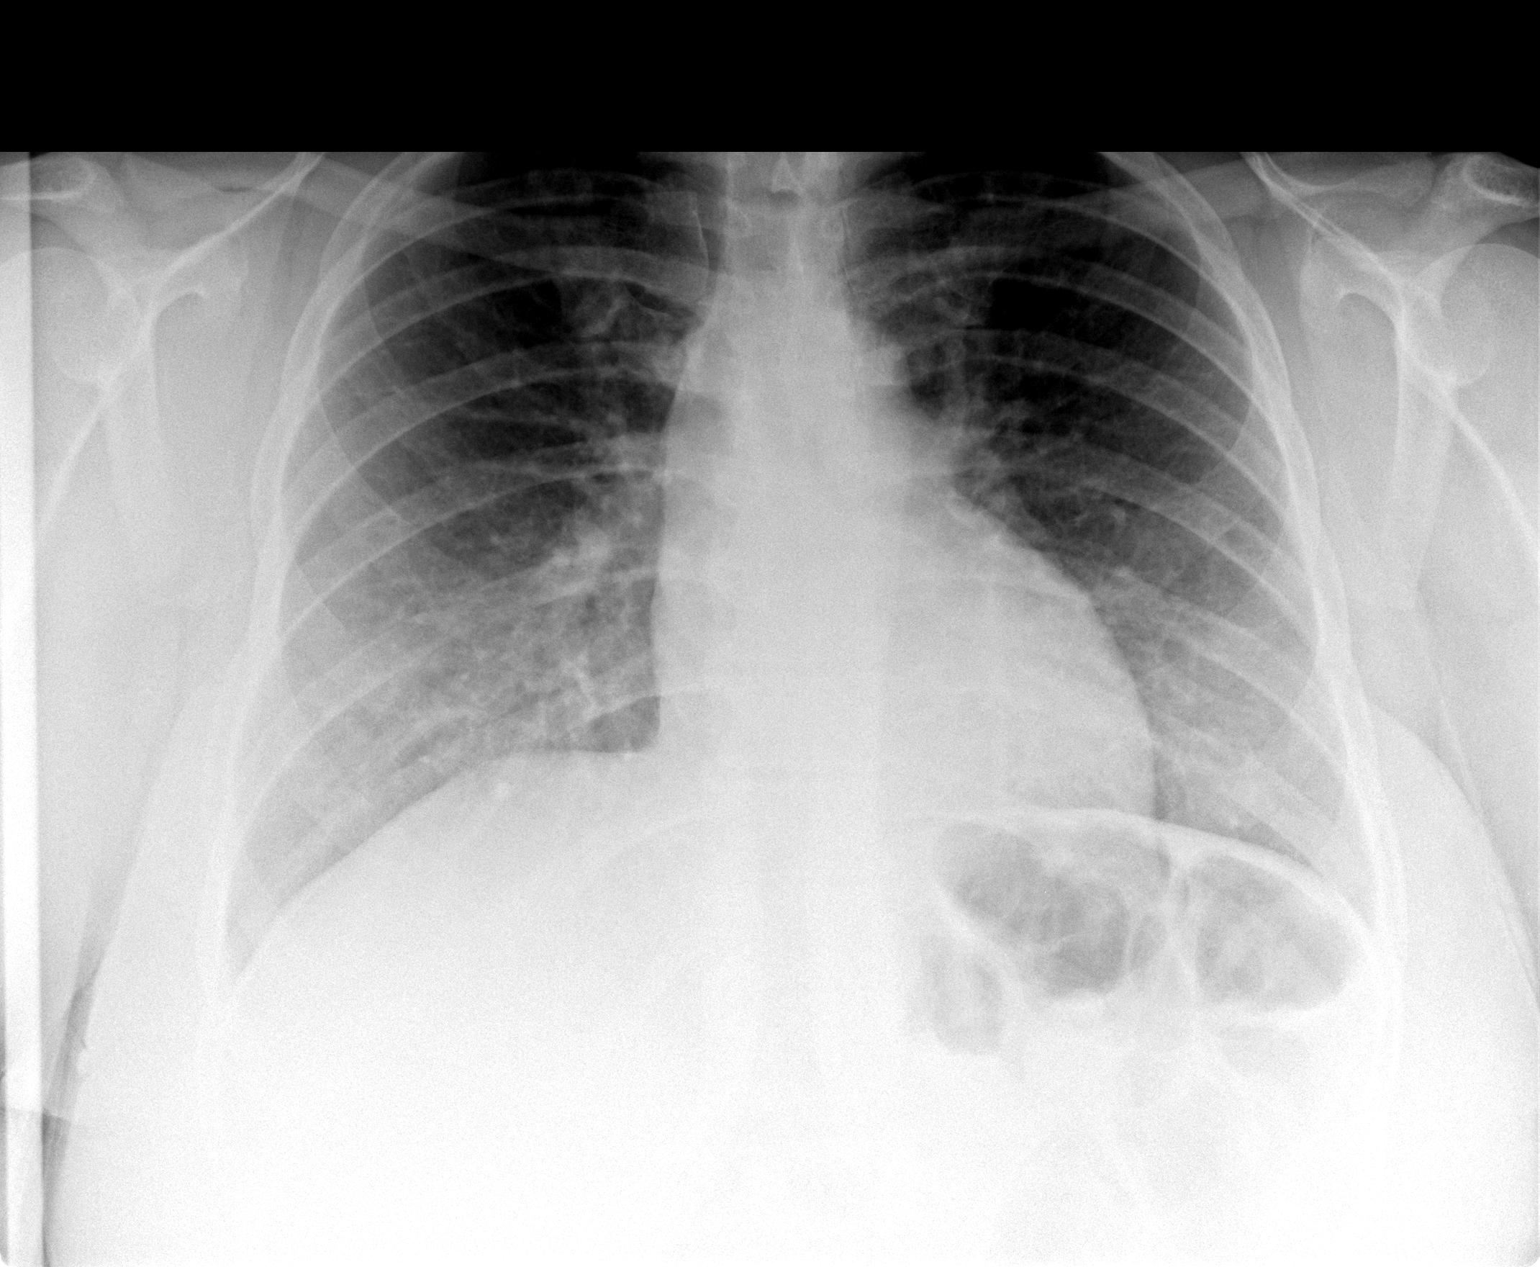

[view not recorded (2 of 2)]
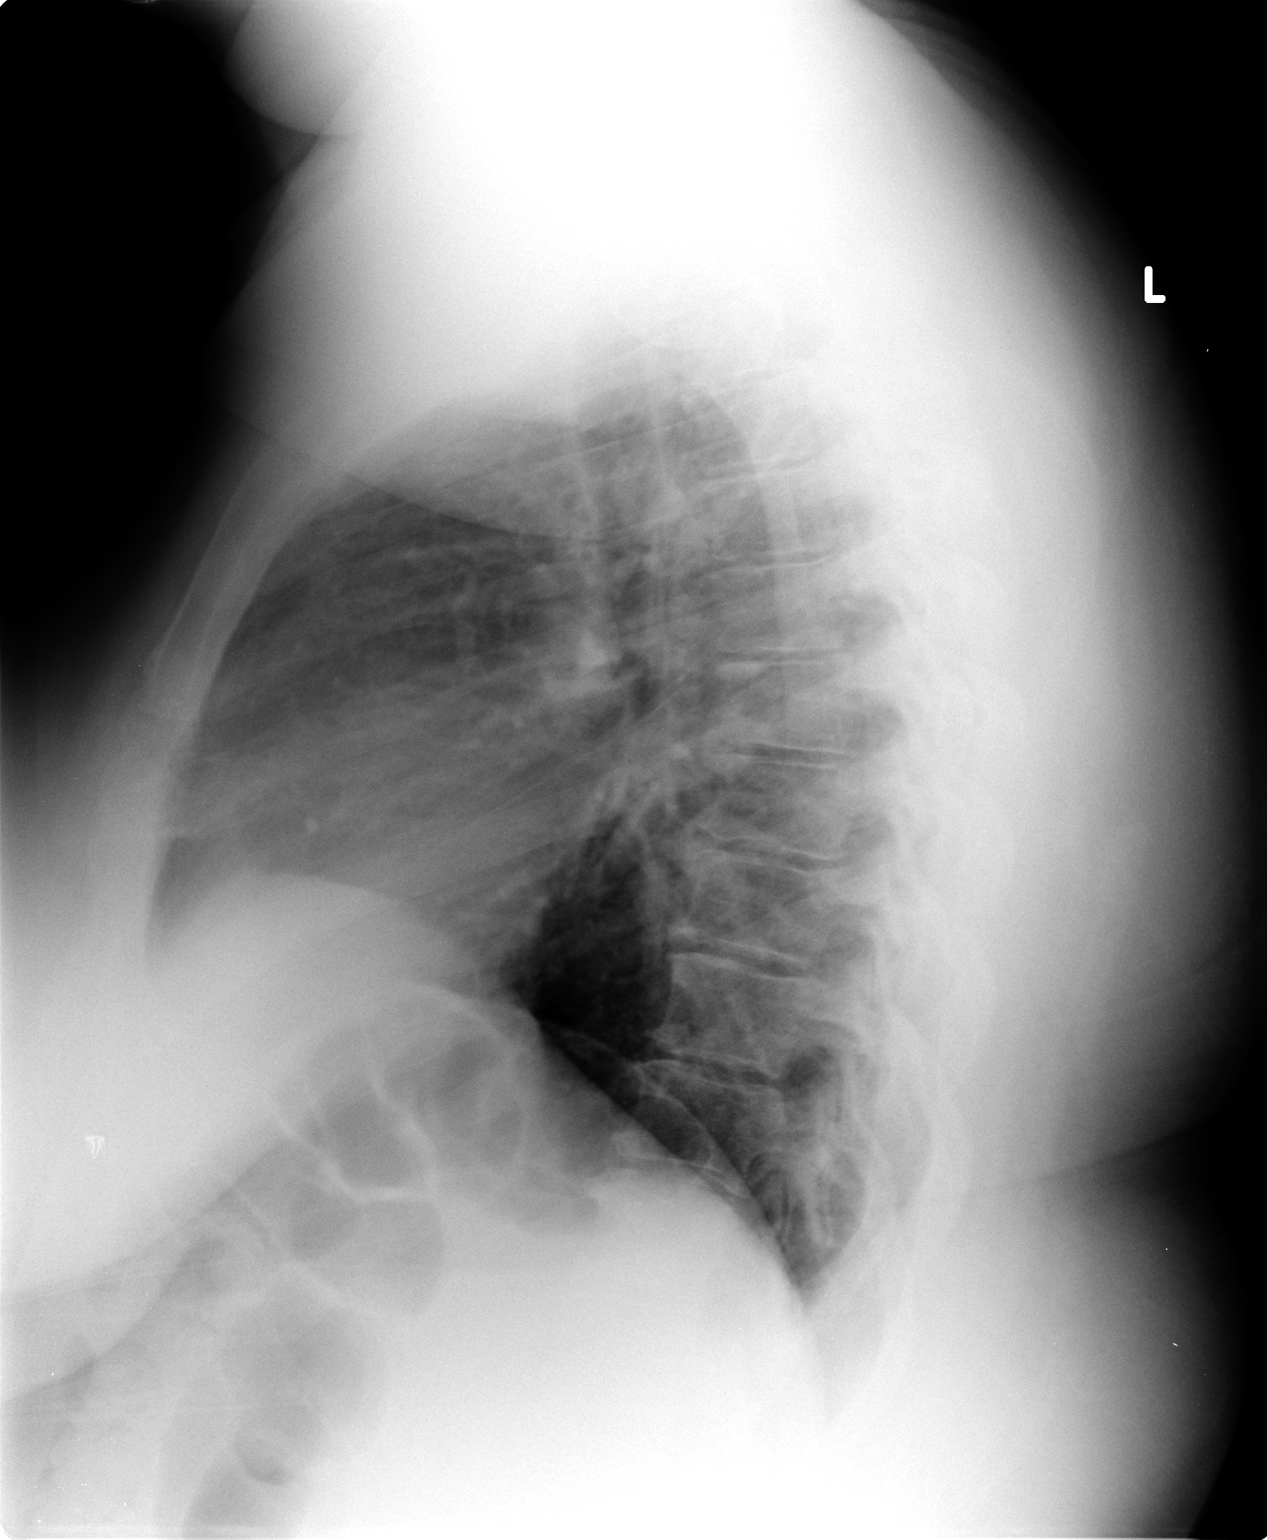

[2 of 2 positions shown; findings below may reference images not displayed]

FINDINGS: No focal infiltrate or effusion is seen. Mediastinal and hilar
contours are unremarkable. The heart is within normal limits in
size. No bony abnormality is seen.
IMPRESSION: No active cardiopulmonary disease.

## 2017-06-17 ENCOUNTER — Other Ambulatory Visit: Payer: Self-pay | Admitting: Family Medicine

## 2017-07-29 ENCOUNTER — Other Ambulatory Visit: Payer: Self-pay | Admitting: Family Medicine

## 2017-09-09 ENCOUNTER — Other Ambulatory Visit: Payer: Self-pay | Admitting: Family Medicine

## 2018-08-21 ENCOUNTER — Other Ambulatory Visit: Payer: Self-pay

## 2018-08-21 ENCOUNTER — Encounter (HOSPITAL_COMMUNITY): Payer: Self-pay

## 2018-08-21 ENCOUNTER — Emergency Department (HOSPITAL_COMMUNITY)
Admission: EM | Admit: 2018-08-21 | Discharge: 2018-08-21 | Disposition: A | Discharge status: Home / Self Care | Payer: Managed Care, Other (non HMO) | Attending: Emergency Medicine | Admitting: Emergency Medicine

## 2018-08-21 DIAGNOSIS — I10 Essential (primary) hypertension: Secondary | ICD-10-CM | POA: Insufficient documentation

## 2018-08-21 DIAGNOSIS — J40 Bronchitis, not specified as acute or chronic: Secondary | ICD-10-CM | POA: Insufficient documentation

## 2018-08-21 DIAGNOSIS — Z20828 Contact with and (suspected) exposure to other viral communicable diseases: Secondary | ICD-10-CM | POA: Insufficient documentation

## 2018-08-21 DIAGNOSIS — J209 Acute bronchitis, unspecified: Secondary | ICD-10-CM

## 2018-08-21 DIAGNOSIS — Z79899 Other long term (current) drug therapy: Secondary | ICD-10-CM | POA: Insufficient documentation

## 2018-08-21 MED ORDER — DOXYCYCLINE HYCLATE 100 MG PO CAPS: 100.0000 mg | ORAL_CAPSULE | Freq: Two times a day (BID) | ORAL | 0 refills | Status: DC

## 2018-08-21 MED ORDER — ALBUTEROL SULFATE HFA 108 (90 BASE) MCG/ACT IN AERS
2.0000 | INHALATION_SPRAY | RESPIRATORY_TRACT | Status: DC | PRN
  Administered 2018-08-21: 2 via RESPIRATORY_TRACT
  Filled 2018-08-21: qty 6.7

## 2018-08-21 MED ORDER — ALBUTEROL SULFATE HFA 108 (90 BASE) MCG/ACT IN AERS: 2.0000 | INHALATION_SPRAY | RESPIRATORY_TRACT | 2 refills | Status: DC | PRN

## 2018-08-21 MED ORDER — PREDNISONE 20 MG PO TABS: 40.0000 mg | ORAL_TABLET | Freq: Every day | ORAL | 0 refills | Status: DC

## 2018-08-21 MED ORDER — PREDNISONE 20 MG PO TABS
60.0000 mg | ORAL_TABLET | Freq: Once | ORAL | Status: AC
  Administered 2018-08-21: 60 mg via ORAL
  Filled 2018-08-21: qty 3

## 2018-08-21 NOTE — ED Provider Notes (Signed)
[facility] DEPT Provider Note   CSN: [id number] Arrival date & time: 08/21/18  0536    History   Chief Complaint Chief Complaint  Patient presents with  . Cough    HPI Rebecca Collier is a 43 y.o. female.     Patient presents to the emergency department for evaluation of cough, chest tightness and wheezing.  Patient reports that she started more than a month ago with sneezing and feeling like she was experiencing seasonal allergies.  Over the last few days this has become a nonproductive cough and she has been feeling very weak.  She has been taking over-the-counter allergy medication without any improvement.     Past Medical History:  Diagnosis Date  . Eczema   . Hypertension   . Obesity   . Sciatica    spasms daily in left leg     Patient Active Problem List   Diagnosis Date Noted  . Eczema 02/06/2013  . Essential hypertension, benign 02/06/2013  . Family history of colon cancer in mother (age 62) 02/06/2013    History reviewed. No pertinent surgical history.   OB History   No obstetric history on file.      Home Medications    Prior to Admission medications   Medication Sig Start Date End Date Taking? Authorizing Provider  albuterol (VENTOLIN HFA) 108 (90 Base) MCG/ACT inhaler Inhale 2 puffs into the lungs every 4 (four) hours as needed for wheezing or shortness of breath. 08/21/18   Faraaz Wolin, Gwenyth Allegra, MD  doxycycline (VIBRAMYCIN) 100 MG capsule Take 1 capsule (100 mg total) by mouth 2 (two) times daily. 08/21/18   Orpah Greek, MD  hydrochlorothiazide (HYDRODIURIL) 25 MG tablet TAKE 1 TABLET BY MOUTH DAILY 06/18/17   Burchette, Alinda Sierras, MD  losartan-hydrochlorothiazide (HYZAAR) 50-12.5 MG per tablet Take 1 tablet by mouth daily. 11/20/14   Lucretia Kern, DO  naproxen (NAPROSYN) 500 MG tablet Take 1 tablet (500 mg total) by mouth 2 (two) times daily. 05/27/15   Domenic Moras, PA-C  Omega-3 Fatty Acids (FISH OIL PO) Take  by mouth.    [provider]  predniSONE (DELTASONE) 20 MG tablet Take 2 tablets (40 mg total) by mouth daily with breakfast. 08/21/18   Orpah Greek, MD  triamcinolone cream (KENALOG) 0.1 % APPLY TO AFFECTED AREA TWICE A DAY 11/23/16   Lucretia Kern, DO  vitamin B-12 (CYANOCOBALAMIN) 1000 MCG tablet Take 1,000 mcg by mouth daily.    [provider]    Family History Family History  Problem Relation Age of Onset  . Colon cancer Mother        dx at age    Social History Social History   Tobacco Use  . Smoking status: Never Smoker  Substance Use Topics  . Alcohol use: Yes    Comment: socially  . Drug use: No     Allergies   Penicillins   Review of Systems Review of Systems  Respiratory: Positive for cough, chest tightness and wheezing.   All other systems reviewed and are negative.    Physical Exam Updated Vital Signs BP (!) 176/130 (BP Location: Left Arm)   Pulse 72   Temp 98.9 F (37.2 C) (Oral)   Resp 20   Ht 5' 6"  (1.676 m)   Wt (!) 138.3 kg   LMP 07/29/2018   SpO2 100%   BMI 49.23 kg/m   Physical Exam Vitals signs and nursing note reviewed.  Constitutional:  General: She is not in acute distress.    Appearance: Normal appearance. She is well-developed.  HENT:     Head: Normocephalic and atraumatic.     Right Ear: Hearing normal.     Left Ear: Hearing normal.     Nose: Nose normal.  Eyes:     Conjunctiva/sclera: Conjunctivae normal.     Pupils: Pupils are equal, round, and reactive to light.  Neck:     Musculoskeletal: Normal range of motion and neck supple.  Cardiovascular:     Rate and Rhythm: Regular rhythm.     Heart sounds: S1 normal and S2 normal. No murmur. No friction rub. No gallop.   Pulmonary:     Effort: Pulmonary effort is normal. No respiratory distress.     Breath sounds: Wheezing present.  Chest:     Chest wall: No tenderness.  Abdominal:     General: Bowel sounds are normal.     Palpations:  Abdomen is soft.     Tenderness: There is no abdominal tenderness. There is no guarding or rebound. Negative signs include Murphy's sign and McBurney's sign.     Hernia: No hernia is present.  Musculoskeletal: Normal range of motion.  Skin:    General: Skin is warm and dry.     Findings: No rash.  Neurological:     Mental Status: She is alert and oriented to person, place, and time.     GCS: GCS eye subscore is 4. GCS verbal subscore is 5. GCS motor subscore is 6.     Cranial Nerves: No cranial nerve deficit.     Sensory: No sensory deficit.     Coordination: Coordination normal.  Psychiatric:        Speech: Speech normal.        Behavior: Behavior normal.        Thought Content: Thought content normal.      ED Treatments / Results  Labs (all labs ordered are listed, but only abnormal results are displayed) Labs Reviewed  NOVEL CORONAVIRUS, NAA (HOSPITAL ORDER, SEND-OUT TO REF LAB)    EKG None  Radiology No results found.  Procedures Procedures (including critical care time)  Medications Ordered in ED Medications  albuterol (VENTOLIN HFA) 108 (90 Base) MCG/ACT inhaler 2 puff (has no administration in time range)  predniSONE (DELTASONE) tablet 60 mg (has no administration in time range)     Initial Impression / Assessment and Plan / ED Course  I have reviewed the triage vital signs and the nursing notes.  Pertinent labs & imaging results that were available during my care of the patient were reviewed by me and considered in my medical decision making (see chart for details).        Patient presents to the emergency department for evaluation of cough, chest tightness and wheezing.  She does not have a history of asthma or COPD.  Cough is nonproductive.  She does not have a fever.  Oxygenation is normal but examination does reveal moderate wheezing.  She is in no respiratory distress.  No clinical concern for pneumonia at this time but will cover empirically.  Will  send outpatient COVID testing.  Treat for bronchospasm.  Final Clinical Impressions(s) / ED Diagnoses   Final diagnoses:  Acute bronchitis, unspecified organism    ED Discharge Orders         Ordered    doxycycline (VIBRAMYCIN) 100 MG capsule  2 times daily     08/21/18 0636    predniSONE (DELTASONE)  20 MG tablet  Daily with breakfast     08/21/18 0636    albuterol (VENTOLIN HFA) 108 (90 Base) MCG/ACT inhaler  Every 4 hours PRN     08/21/18 0636           Orpah Greek, MD 08/21/18 [phone]

## 2018-08-21 NOTE — ED Triage Notes (Signed)
Pt arrived stating that she has been sick since April with no relief but has been worse in the last few days. Pt states she has a cough, wheezing and feeling weak. Report having seasonal allergies and have taken many over the counter medications with no relief.  Pt in no respiratory distress.

## 2018-08-22 LAB — NOVEL CORONAVIRUS, NAA (HOSP ORDER, SEND-OUT TO REF LAB; TAT 18-24 HRS): SARS-CoV-2, NAA: NOT DETECTED

## 2019-01-16 ENCOUNTER — Emergency Department (HOSPITAL_COMMUNITY): Payer: Self-pay

## 2019-01-16 ENCOUNTER — Emergency Department (HOSPITAL_COMMUNITY)
Admission: EM | Admit: 2019-01-16 | Discharge: 2019-01-16 | Disposition: A | Discharge status: Home / Self Care | Payer: Self-pay | Attending: Emergency Medicine | Admitting: Emergency Medicine

## 2019-01-16 ENCOUNTER — Other Ambulatory Visit: Payer: Self-pay

## 2019-01-16 ENCOUNTER — Encounter (HOSPITAL_COMMUNITY): Payer: Self-pay

## 2019-01-16 DIAGNOSIS — Z79899 Other long term (current) drug therapy: Secondary | ICD-10-CM | POA: Insufficient documentation

## 2019-01-16 DIAGNOSIS — I10 Essential (primary) hypertension: Secondary | ICD-10-CM | POA: Insufficient documentation

## 2019-01-16 DIAGNOSIS — I159 Secondary hypertension, unspecified: Secondary | ICD-10-CM

## 2019-01-16 DIAGNOSIS — R519 Headache, unspecified: Secondary | ICD-10-CM | POA: Insufficient documentation

## 2019-01-16 LAB — I-STAT BETA HCG BLOOD, ED (MC, WL, AP ONLY): I-stat hCG, quantitative: <5 m[IU]/mL (ref <5)

## 2019-01-16 MED ORDER — METOCLOPRAMIDE HCL 10 MG PO TABS: 10.0000 mg | ORAL_TABLET | Freq: Three times a day (TID) | ORAL | 0 refills | Status: DC | PRN

## 2019-01-16 MED ORDER — DIVALPROEX SODIUM 500 MG PO DR TAB
500.0000 mg | DELAYED_RELEASE_TABLET | ORAL | Status: AC
  Administered 2019-01-16: 500 mg via ORAL
  Filled 2019-01-16: qty 1

## 2019-01-16 MED ORDER — AMLODIPINE BESYLATE 5 MG PO TABS
5.0000 mg | ORAL_TABLET | Freq: Once | ORAL | Status: AC
  Administered 2019-01-16: 5 mg via ORAL
  Filled 2019-01-16: qty 1

## 2019-01-16 MED ORDER — MAGNESIUM SULFATE 2 GM/50ML IV SOLN
2.0000 g | Freq: Once | INTRAVENOUS | Status: AC
  Administered 2019-01-16: 2 g via INTRAVENOUS
  Filled 2019-01-16: qty 50

## 2019-01-16 MED ORDER — METOCLOPRAMIDE HCL 5 MG/ML IJ SOLN
10.0000 mg | Freq: Once | INTRAMUSCULAR | Status: AC
Start: 2019-01-16 — End: 2019-01-16
  Administered 2019-01-16: 10 mg via INTRAVENOUS
  Filled 2019-01-16: qty 2

## 2019-01-16 MED ORDER — DIPHENHYDRAMINE HCL 50 MG/ML IJ SOLN
12.5000 mg | Freq: Once | INTRAMUSCULAR | Status: AC
  Administered 2019-01-16: 12.5 mg via INTRAVENOUS
  Filled 2019-01-16: qty 1

## 2019-01-16 MED ORDER — TRIAMCINOLONE ACETONIDE 0.1 % EX CREA: TOPICAL_CREAM | CUTANEOUS | 0 refills | Status: DC

## 2019-01-16 MED ORDER — ACETAMINOPHEN 500 MG PO TABS
1000.0000 mg | ORAL_TABLET | Freq: Once | ORAL | Status: AC
  Administered 2019-01-16: 1000 mg via ORAL
  Filled 2019-01-16: qty 2

## 2019-01-16 MED ORDER — KETOROLAC TROMETHAMINE 30 MG/ML IJ SOLN
30.0000 mg | Freq: Once | INTRAMUSCULAR | Status: AC
  Administered 2019-01-16: 30 mg via INTRAVENOUS
  Filled 2019-01-16 (×2): qty 1

## 2019-01-16 NOTE — ED Provider Notes (Addendum)
[facility] DEPT Provider Note   CSN: [id number] Arrival date & time: 01/16/19  0059     History   Chief Complaint No chief complaint on file.   HPI Rebecca Collier is a 43 y.o. female.     The history is provided by the patient.  Headache Pain location:  L temporal Quality:  Dull Radiates to:  Does not radiate Severity currently:  10/10 Severity at highest:  10/10 Onset quality:  Gradual Duration:  1 week Timing:  Constant Progression:  Worsening Chronicity:  New Context: not activity, not caffeine, not coughing, not defecating, not eating, not stress, not exposure to cold air, not intercourse, not loud noise and not straining   Relieved by:  Nothing Worsened by:  Nothing Ineffective treatments:  NSAIDs (hasnt taken anything in 24 hours) Associated symptoms: no abdominal pain, no back pain, no blurred vision, no congestion, no cough, no diarrhea, no dizziness, no drainage, no ear pain, no eye pain, no facial pain, no fatigue, no fever, no focal weakness, no hearing loss, no loss of balance, no myalgias, no nausea, no near-syncope, no neck pain, no neck stiffness, no numbness, no paresthesias, no photophobia, no seizures, no sinus pressure, no sore throat, no swollen glands, no syncope, no tingling, no URI, no visual change, no vomiting and no weakness   Risk factors: no anger and no family hx of SAH   Patient with one week of headache.  BP has been up.  No weakness, nor numbness.  No changes in vision or speech.  No loss of peripheral vision.  No confusion.  No proptosis.  No pain or decreased ocular movements.  No fever/c/r.  No neck pain or stiffness.    Past Medical History:  Diagnosis Date  . Eczema   . Hypertension   . Obesity   . Sciatica    spasms daily in left leg     Patient Active Problem List   Diagnosis Date Noted  . Eczema 02/06/2013  . Essential hypertension, benign 02/06/2013  . Family history of colon cancer in mother  (age 60) 02/06/2013    History reviewed. No pertinent surgical history.   OB History   No obstetric history on file.      Home Medications    Prior to Admission medications   Medication Sig Start Date End Date Taking? Authorizing Provider  albuterol (VENTOLIN HFA) 108 (90 Base) MCG/ACT inhaler Inhale 2 puffs into the lungs every 4 (four) hours as needed for wheezing or shortness of breath. 08/21/18  Yes Pollina, Gwenyth Allegra, MD  hydrochlorothiazide (HYDRODIURIL) 25 MG tablet TAKE 1 TABLET BY MOUTH DAILY 06/18/17  Yes Burchette, Alinda Sierras, MD  ibuprofen (ADVIL) 200 MG tablet Take 600 mg by mouth every 6 (six) hours as needed for moderate pain.   Yes [provider]  doxycycline (VIBRAMYCIN) 100 MG capsule Take 1 capsule (100 mg total) by mouth 2 (two) times daily. Patient not taking: Reported on 01/16/2019 08/21/18   Orpah Greek, MD  losartan-hydrochlorothiazide (HYZAAR) 50-12.5 MG per tablet Take 1 tablet by mouth daily. Patient not taking: Reported on 01/16/2019 11/20/14   Lucretia Kern, DO  naproxen (NAPROSYN) 500 MG tablet Take 1 tablet (500 mg total) by mouth 2 (two) times daily. Patient not taking: Reported on 01/16/2019 05/27/15   Domenic Moras, PA-C  predniSONE (DELTASONE) 20 MG tablet Take 2 tablets (40 mg total) by mouth daily with breakfast. Patient not taking: Reported on 01/16/2019 08/21/18   Joseph Berkshire  J, MD  triamcinolone cream (KENALOG) 0.1 % APPLY TO AFFECTED AREA TWICE A DAY Patient not taking: Reported on 01/16/2019 11/23/16   Lucretia Kern, DO    Family History Family History  Problem Relation Age of Onset  . Colon cancer Mother        dx at age    Social History Social History   Tobacco Use  . Smoking status: Never Smoker  . Smokeless tobacco: Never Used  Substance Use Topics  . Alcohol use: Yes    Comment: socially  . Drug use: No     Allergies   Penicillins   Review of Systems Review of Systems  Constitutional:  Negative for chills, fatigue and fever.  HENT: Negative for congestion, ear pain, hearing loss, postnasal drip, sinus pressure and sore throat.   Eyes: Negative for blurred vision, photophobia, pain and visual disturbance.  Respiratory: Negative for cough.   Cardiovascular: Negative for chest pain, syncope and near-syncope.  Gastrointestinal: Negative for abdominal pain, diarrhea, nausea and vomiting.  Genitourinary: Negative for difficulty urinating.  Musculoskeletal: Negative for back pain, myalgias, neck pain and neck stiffness.  Neurological: Positive for headaches. Negative for dizziness, focal weakness, seizures, facial asymmetry, speech difficulty, weakness, numbness, paresthesias and loss of balance.  Hematological: Negative for adenopathy.  Psychiatric/Behavioral: Negative for confusion and decreased concentration.  All other systems reviewed and are negative.    Physical Exam Updated Vital Signs BP (!) 181/104   Pulse 83   Temp 98 F (36.7 C) (Oral)   Resp 20   LMP 12/30/2018   SpO2 100%   Physical Exam Vitals signs and nursing note reviewed.  Constitutional:      General: She is not in acute distress.    Appearance: Normal appearance.     Comments: Appears comfortable in the room   HENT:     Head: Normocephalic and atraumatic.     Nose: Nose normal.     Mouth/Throat:     Mouth: Mucous membranes are moist.  Eyes:     Extraocular Movements: Extraocular movements intact.     Conjunctiva/sclera: Conjunctivae normal.     Pupils: Pupils are equal, round, and reactive to light.     Comments: No proptosis, intact cognition. Disk margins sharp B  Neck:     Musculoskeletal: Normal range of motion and neck supple. No neck rigidity.  Cardiovascular:     Rate and Rhythm: Normal rate and regular rhythm.     Pulses: Normal pulses.     Heart sounds: Normal heart sounds.  Pulmonary:     Effort: Pulmonary effort is normal.     Breath sounds: Normal breath sounds.   Abdominal:     General: Abdomen is flat. Bowel sounds are normal.     Tenderness: There is no abdominal tenderness. There is no guarding.  Musculoskeletal: Normal range of motion.  Lymphadenopathy:     Cervical: No cervical adenopathy.  Skin:    General: Skin is warm and dry.     Capillary Refill: Capillary refill takes less than 2 seconds.  Neurological:     General: No focal deficit present.     Mental Status: She is alert and oriented to person, place, and time.     Cranial Nerves: No cranial nerve deficit.     Deep Tendon Reflexes: Reflexes normal.  Psychiatric:        Mood and Affect: Mood normal.        Behavior: Behavior normal.      ED  Treatments / Results  Labs (all labs ordered are listed, but only abnormal results are displayed) Labs Reviewed  I-STAT BETA HCG BLOOD, ED ([facility], [facility], [facility] ONLY)    EKG None  Radiology Ct Head Wo Contrast  Result Date: 01/16/2019 CLINICAL DATA:  43 year old female with headache for 2 days. Blurred vision and neck stiffness. EXAM: CT HEAD WITHOUT CONTRAST TECHNIQUE: Contiguous axial images were obtained from the base of the skull through the vertex without intravenous contrast. COMPARISON:  None. FINDINGS: Brain: Partially empty sella. Normal cerebral volume. No midline shift, ventriculomegaly, mass effect, evidence of mass lesion, intracranial hemorrhage or evidence of cortically based acute infarction. Gray-white matter differentiation is within normal limits throughout the brain. Vascular: No suspicious intracranial vascular hyperdensity. Skull: Negative. Sinuses/Orbits: Visualized paranasal sinuses and mastoids are clear. Other: Visualized orbits and scalp soft tissues are within normal limits. IMPRESSION: Normal noncontrast CT appearance of the brain aside from partially empty sella, which is often a normal anatomic variant but can be associated with idiopathic intracranial hypertension (pseudotumor cerebri). Electronically Signed   By: Genevie Ann M.D.   On: 01/16/2019 02:03    Procedures Procedures (including critical care time)  Medications Ordered in ED Medications  divalproex (DEPAKOTE) DR tablet 500 mg (has no administration in time range)  acetaminophen (TYLENOL) tablet 1,000 mg (has no administration in time range)  ketorolac (TORADOL) 30 MG/ML injection 30 mg (30 mg Intravenous Given 01/16/19 0240)  metoCLOPramide (REGLAN) injection 10 mg (10 mg Intravenous Given 01/16/19 0240)  diphenhydrAMINE (BENADRYL) injection 12.5 mg (12.5 mg Intravenous Given 01/16/19 0240)  amLODipine (NORVASC) tablet 5 mg (5 mg Oral Given 01/16/19 0239)  magnesium sulfate IVPB 2 g 50 mL (0 g Intravenous Stopped 01/16/19 0301)       Visual Acuity  20/50 L/ 20/50 R     Disks are sharp.  I do not believe this is an ICH, meningitis nor a cavernous sinus thrombosis.  I do not believe this patient needs a lumbar puncture at this time as there are no signs of intracranial infection and the patient's vision is intact and has sharp disk margins.  I do not believe this patient has cavernous sinus thrombosis as there are no signs on CT after a week.  There are no physical signs of this on exam.  I have informed the patient of the empty sella seen on CT scan and need to follow up with PMD for HTN management and neurology for further diagnosis and treatment.  Patient verbalizes understanding and agrees to follow up.  Strict return precautions given.   Final Clinical Impressions(s) / ED Diagnoses   Patient will need to follow up with her PMD and neurology for ongoing care.  Take BP medication as directed.    Return for intractable cough, coughing up blood,fevers >100.4 unrelieved by medication, shortness of breath, intractable vomiting, chest pain, shortness of breath, weakness,numbness, changes in speech, facial asymmetry,abdominal pain, passing out,Inability to tolerate liquids or food, cough, altered mental status or any concerns. No signs of  systemic illness or infection. The patient is nontoxic-appearing on exam and vital signs are within normal limits.   I have reviewed the triage vital signs and the nursing notes. Pertinent labs &imaging results that were available during my care of the patient were reviewed by me and considered in my medical decision making (see chart for details).  After history, exam, and medical workup I feel the patient has been appropriately medically screened and is safe for discharge home.  Pertinent diagnoses were discussed with the patient. Patient was given return precautions      Darnelle Derrick, MD 01/16/19 0345    Yari Szeliga, MD 01/16/19 5498

## 2019-01-16 NOTE — ED Notes (Signed)
Visual acuity: Right eye 20/50 Left eye: 20/50

## 2019-01-16 NOTE — ED Triage Notes (Signed)
Pt complains of head pain for two days, she denies any injury and says that it makes her neck stiff and eyes blurry Pt takes blood pressure meds every morning

## 2019-01-16 NOTE — ED Notes (Signed)
Pt transported to CT ?

## 2019-01-18 ENCOUNTER — Emergency Department (HOSPITAL_COMMUNITY)
Admission: EM | Admit: 2019-01-18 | Discharge: 2019-01-18 | Disposition: A | Discharge status: Home / Self Care | Payer: Self-pay | Attending: Emergency Medicine | Admitting: Emergency Medicine

## 2019-01-18 ENCOUNTER — Other Ambulatory Visit: Payer: Self-pay

## 2019-01-18 DIAGNOSIS — I10 Essential (primary) hypertension: Secondary | ICD-10-CM | POA: Insufficient documentation

## 2019-01-18 DIAGNOSIS — R002 Palpitations: Secondary | ICD-10-CM | POA: Insufficient documentation

## 2019-01-18 DIAGNOSIS — R519 Headache, unspecified: Secondary | ICD-10-CM | POA: Insufficient documentation

## 2019-01-18 DIAGNOSIS — Z79899 Other long term (current) drug therapy: Secondary | ICD-10-CM | POA: Insufficient documentation

## 2019-01-18 LAB — CBC WITH DIFFERENTIAL/PLATELET
Abs Immature Granulocytes: 0 10*3/uL (ref 0.00–0.07)
Basophils Absolute: 0 10*3/uL (ref 0.0–0.1)
Basophils Relative: 1 %
Eosinophils Absolute: 0.5 10*3/uL (ref 0.0–0.5)
Eosinophils Relative: 7 %
HCT: 45.5 % (ref 36.0–46.0)
Hemoglobin: 14.5 g/dL (ref 12.0–15.0)
Immature Granulocytes: 0 %
Lymphocytes Relative: 22 %
Lymphs Abs: 1.5 10*3/uL (ref 0.7–4.0)
MCH: 28.5 pg (ref 26.0–34.0)
MCHC: 31.9 g/dL (ref 30.0–36.0)
MCV: 89.6 fL (ref 80.0–100.0)
Monocytes Absolute: 0.6 10*3/uL (ref 0.1–1.0)
Monocytes Relative: 9 %
Neutro Abs: 4.2 10*3/uL (ref 1.7–7.7)
Neutrophils Relative %: 61 %
Platelets: 297 10*3/uL (ref 150–400)
RBC: 5.08 MIL/uL (ref 3.87–5.11)
RDW: 12.9 % (ref 11.5–15.5)
WBC: 6.8 10*3/uL (ref 4.0–10.5)
nRBC: 0 % (ref 0.0–0.2)

## 2019-01-18 LAB — BASIC METABOLIC PANEL
Anion gap: 9 (ref 5–15)
BUN: 15 mg/dL (ref 6–20)
CO2: 25 mmol/L (ref 22–32)
Calcium: 10.2 mg/dL (ref 8.9–10.3)
Chloride: 101 mmol/L (ref 98–111)
Creatinine, Ser: 0.82 mg/dL (ref 0.44–1.00)
GFR calc Af Amer: >60 mL/min (ref >60)
GFR calc non Af Amer: >60 mL/min (ref >60)
Glucose, Bld: 124 mg/dL — ABNORMAL HIGH (ref 70–99)
Potassium: 3.7 mmol/L (ref 3.5–5.1)
Sodium: 135 mmol/L (ref 135–145)

## 2019-01-18 LAB — TROPONIN I (HIGH SENSITIVITY)
Troponin I (High Sensitivity): 4 ng/L (ref <18)
Troponin I (High Sensitivity): 4 ng/L (ref <18)

## 2019-01-18 LAB — I-STAT BETA HCG BLOOD, ED (MC, WL, AP ONLY): I-stat hCG, quantitative: <5 m[IU]/mL (ref <5)

## 2019-01-18 MED ORDER — AMLODIPINE BESYLATE 5 MG PO TABS: 5.0000 mg | ORAL_TABLET | Freq: Every day | ORAL | 0 refills | Status: DC

## 2019-01-18 MED ORDER — AMLODIPINE BESYLATE 5 MG PO TABS
5.0000 mg | ORAL_TABLET | Freq: Once | ORAL | Status: AC
  Administered 2019-01-18: 5 mg via ORAL
  Filled 2019-01-18: qty 1

## 2019-01-18 MED ORDER — ACETAMINOPHEN 500 MG PO TABS
1000.0000 mg | ORAL_TABLET | Freq: Once | ORAL | Status: AC
  Administered 2019-01-18: 1000 mg via ORAL
  Filled 2019-01-18: qty 2

## 2019-01-18 NOTE — ED Provider Notes (Signed)
[facility] DEPT Provider Note   CSN: [id number] Arrival date & time: 01/18/19  1924     History   Chief Complaint Chief Complaint  Patient presents with  . Hypertension    HPI Rebecca Collier is a 43 y.o. female.     Patient with history hypertension, c/o blood pressure being high as well as intermittent frontal headache.  Symptoms gradual onset, persistent, recurrent. Denies any acute, abrupt, or severe head pain. No neck pain or stiffness. No eye pain. No blurry vision or any visual symptoms. No sinus drainage or congestion. No cough or uri symptoms. Denies fever or chills. No head trauma. No syncope. States compliant w med, hctz, and did take earlier today. States no recent pcp f/u for same. Has previously been seen in ED for similar symptoms, and referred to pcp f/u and neurology f/u -  States plans to call Monday b/c office was already closed on Friday. Patient denies cp or discomfort. No sob or unusual doe. No swelling. No recent wt loss or gain. No hx thyroid disease. States did have palpitations earlier, no syncope, no hx dysrhythmia.   The history is provided by the patient.  Hypertension Pertinent negatives include no chest pain, no abdominal pain, no headaches and no shortness of breath.    Past Medical History:  Diagnosis Date  . Eczema   . Hypertension   . Obesity   . Sciatica    spasms daily in left leg     Patient Active Problem List   Diagnosis Date Noted  . Eczema 02/06/2013  . Essential hypertension, benign 02/06/2013  . Family history of colon cancer in mother (age 72) 02/06/2013    No past surgical history on file.   OB History   No obstetric history on file.      Home Medications    Prior to Admission medications   Medication Sig Start Date End Date Taking? Authorizing Provider  albuterol (VENTOLIN HFA) 108 (90 Base) MCG/ACT inhaler Inhale 2 puffs into the lungs every 4 (four) hours as needed for wheezing or  shortness of breath. 08/21/18  Yes Pollina, Gwenyth Allegra, MD  diphenhydrAMINE (BENADRYL) 25 MG tablet Take 25 mg by mouth every 6 (six) hours as needed for itching.   Yes [provider]  hydrochlorothiazide (HYDRODIURIL) 25 MG tablet TAKE 1 TABLET BY MOUTH DAILY Patient taking differently: Take 25 mg by mouth daily.  06/18/17  Yes Burchette, Alinda Sierras, MD  ibuprofen (ADVIL) 200 MG tablet Take 600 mg by mouth every 6 (six) hours as needed for moderate pain.   Yes [provider]  metoCLOPramide (REGLAN) 10 MG tablet Take 1 tablet (10 mg total) by mouth every 8 (eight) hours as needed for nausea (nausea/headache). 01/16/19  Yes Palumbo, April, MD  triamcinolone cream (KENALOG) 0.1 % APPLY TO AFFECTED AREA TWICE A DAY Patient taking differently: Apply 1 application topically 2 (two) times daily.  01/16/19  Yes Palumbo, April, MD  doxycycline (VIBRAMYCIN) 100 MG capsule Take 1 capsule (100 mg total) by mouth 2 (two) times daily. Patient not taking: Reported on 01/16/2019 08/21/18   Orpah Greek, MD  losartan-hydrochlorothiazide (HYZAAR) 50-12.5 MG per tablet Take 1 tablet by mouth daily. Patient not taking: Reported on 01/16/2019 11/20/14   Lucretia Kern, DO  naproxen (NAPROSYN) 500 MG tablet Take 1 tablet (500 mg total) by mouth 2 (two) times daily. Patient not taking: Reported on 01/16/2019 05/27/15   Domenic Moras, PA-C  predniSONE (DELTASONE) 20 MG  tablet Take 2 tablets (40 mg total) by mouth daily with breakfast. Patient not taking: Reported on 01/16/2019 08/21/18   Orpah Greek, MD    Family History Family History  Problem Relation Age of Onset  . Colon cancer Mother        dx at age    Social History Social History   Tobacco Use  . Smoking status: Never Smoker  . Smokeless tobacco: Never Used  Substance Use Topics  . Alcohol use: Yes    Comment: socially  . Drug use: No     Allergies   Penicillins   Review of Systems Review of Systems   Constitutional: Negative for chills and fever.  HENT: Negative for sinus pain.   Eyes: Negative for pain and visual disturbance.  Respiratory: Negative for cough and shortness of breath.   Cardiovascular: Positive for palpitations. Negative for chest pain and leg swelling.  Gastrointestinal: Negative for abdominal pain and vomiting.  Genitourinary: Negative for flank pain.  Musculoskeletal: Negative for back pain, neck pain and neck stiffness.  Skin: Negative for rash.  Neurological: Negative for syncope, speech difficulty, weakness, numbness and headaches.  Hematological: Does not bruise/bleed easily.  Psychiatric/Behavioral: Negative for confusion.     Physical Exam Updated Vital Signs BP (!) 178/106 (BP Location: Left Arm)   Pulse 74   Temp 99 F (37.2 C) (Oral)   Resp 18   Ht 1.6 m (5' 3" )   Wt 136.1 kg   LMP 12/30/2018   SpO2 100%   BMI 53.14 kg/m   Physical Exam Vitals signs and nursing note reviewed.  Constitutional:      General: She is not in acute distress.    Appearance: Normal appearance. She is well-developed. She is not diaphoretic.  HENT:     Head: Atraumatic.     Comments: No sinus or temporal tenderness.     Nose: Nose normal.     Mouth/Throat:     Mouth: Mucous membranes are moist.  Eyes:     General: No scleral icterus.    Extraocular Movements: Extraocular movements intact.     Conjunctiva/sclera: Conjunctivae normal.     Pupils: Pupils are equal, round, and reactive to light.     Comments: Sharp discs, no papilledema.   Neck:     Musculoskeletal: Normal range of motion and neck supple. No neck rigidity or muscular tenderness.     Thyroid: No thyromegaly.     Vascular: No carotid bruit.     Trachea: No tracheal deviation.     Comments: No stiffness or rigidity.  Cardiovascular:     Rate and Rhythm: Normal rate and regular rhythm.     Pulses: Normal pulses.     Heart sounds: Normal heart sounds. No murmur. No friction rub. No gallop.    Pulmonary:     Effort: Pulmonary effort is normal. No respiratory distress.     Breath sounds: Normal breath sounds.  Abdominal:     General: Bowel sounds are normal. There is no distension.     Palpations: Abdomen is soft.     Tenderness: There is no abdominal tenderness. There is no guarding.     Comments: No bruits.   Genitourinary:    Comments: No cva tenderness.  Musculoskeletal: Normal range of motion.        General: No swelling or tenderness.  Skin:    General: Skin is warm and dry.     Findings: No rash.  Neurological:     Mental  Status: She is alert and oriented to person, place, and time.     Cranial Nerves: No cranial nerve deficit.     Comments: Alert, speech normal. Motor intact bil, stre 5/5.  No pronator drift. sens grossly intact bil. Steady gait.   Psychiatric:        Mood and Affect: Mood normal.      ED Treatments / Results  Labs (all labs ordered are listed, but only abnormal results are displayed) Results for orders placed or performed during the hospital encounter of 13/24/40  Basic metabolic panel  Result Value Ref Range   Sodium 135 135 - 145 mmol/L   Potassium 3.7 3.5 - 5.1 mmol/L   Chloride 101 98 - 111 mmol/L   CO2 25 22 - 32 mmol/L   Glucose, Bld 124 (H) 70 - 99 mg/dL   BUN 15 6 - 20 mg/dL   Creatinine, Ser 0.82 0.44 - 1.00 mg/dL   Calcium 10.2 8.9 - 10.3 mg/dL   GFR calc non Af Amer >60 >60 mL/min   GFR calc Af Amer >60 >60 mL/min   Anion gap 9 5 - 15  CBC with Differential  Result Value Ref Range   WBC 6.8 4.0 - 10.5 K/uL   RBC 5.08 3.87 - 5.11 MIL/uL   Hemoglobin 14.5 12.0 - 15.0 g/dL   HCT 45.5 36.0 - 46.0 %   MCV 89.6 80.0 - 100.0 fL   MCH 28.5 26.0 - 34.0 pg   MCHC 31.9 30.0 - 36.0 g/dL   RDW 12.9 11.5 - 15.5 %   Platelets 297 150 - 400 K/uL   nRBC 0.0 0.0 - 0.2 %   Neutrophils Relative % 61 %   Neutro Abs 4.2 1.7 - 7.7 K/uL   Lymphocytes Relative 22 %   Lymphs Abs 1.5 0.7 - 4.0 K/uL   Monocytes Relative 9 %   Monocytes  Absolute 0.6 0.1 - 1.0 K/uL   Eosinophils Relative 7 %   Eosinophils Absolute 0.5 0.0 - 0.5 K/uL   Basophils Relative 1 %   Basophils Absolute 0.0 0.0 - 0.1 K/uL   Immature Granulocytes 0 %   Abs Immature Granulocytes 0.00 0.00 - 0.07 K/uL  I-Stat beta hCG blood, ED  Result Value Ref Range   I-stat hCG, quantitative <5.0 <5 mIU/mL   Comment 3          Troponin I (High Sensitivity)  Result Value Ref Range   Troponin I (High Sensitivity) 4 <18 ng/L  Troponin I (High Sensitivity)  Result Value Ref Range   Troponin I (High Sensitivity) 4 <18 ng/L   Ct Head Wo Contrast  Result Date: 01/16/2019 CLINICAL DATA:  43 year old female with headache for 2 days. Blurred vision and neck stiffness. EXAM: CT HEAD WITHOUT CONTRAST TECHNIQUE: Contiguous axial images were obtained from the base of the skull through the vertex without intravenous contrast. COMPARISON:  None. FINDINGS: Brain: Partially empty sella. Normal cerebral volume. No midline shift, ventriculomegaly, mass effect, evidence of mass lesion, intracranial hemorrhage or evidence of cortically based acute infarction. Gray-white matter differentiation is within normal limits throughout the brain. Vascular: No suspicious intracranial vascular hyperdensity. Skull: Negative. Sinuses/Orbits: Visualized paranasal sinuses and mastoids are clear. Other: Visualized orbits and scalp soft tissues are within normal limits. IMPRESSION: Normal noncontrast CT appearance of the brain aside from partially empty sella, which is often a normal anatomic variant but can be associated with idiopathic intracranial hypertension (pseudotumor cerebri). Electronically Signed   By: Genevie Ann  M.D.   On: 01/16/2019 02:03    EKG None  Radiology No results found.  Procedures Procedures (including critical care time)  Medications Ordered in ED Medications  amLODipine (NORVASC) tablet 5 mg (has no administration in time range)  acetaminophen (TYLENOL) tablet 1,000 mg  (has no administration in time range)     Initial Impression / Assessment and Plan / ED Course  I have reviewed the triage vital signs and the nursing notes.  Pertinent labs & imaging results that were available during my care of the patient were reviewed by me and considered in my medical decision making (see chart for details).  Labs sent. Continuous pulse ox and monitor.   Reviewed nursing notes and prior charts for additional history. Recent head ct for same symptoms, neg for acute process.   Recheck bp - remains elevated. As bp persistently elevated despite hctz on this and prior visits, will add amlodipine po - dose given in ED. Also discussed for htn, and palpitations - cardiology f/u as outpt. Also prior referral to neurology - encouraged to f/u there as well.   Labs reviewed/interpreted by me - chem normal. Trop normal .  Acetaminophen po.  Patient with normal neuro exam, in nsr, no dysrhythmia on monitor. No current or previous visual symptoms of any sort. Hx long standing hypertension, relatively poorly controlled. Discussed need pcp/card f/u for same.   Patient currently appears stable for d/c.   Rec close f/u as above.   Return precautions provided.      Final Clinical Impressions(s) / ED Diagnoses   Final diagnoses:  None    ED Discharge Orders    None       Lajean Saver, MD 01/18/19 2334

## 2019-01-18 NOTE — ED Triage Notes (Signed)
Pt arriving POV with complaint of headache and HTN. Pt reports being on HTN medications but feels it is not working. Denies chest pain but reports feeling that her heart is racing. HR 79 at this time

## 2019-01-18 NOTE — ED Notes (Signed)
Pt was verbalized discharge instructions. Pt had no further questions at this time. NAD.

## 2019-01-18 NOTE — Discharge Instructions (Signed)
It was our pleasure to provide your ER care today - we hope that you feel better.  Your labs look good.  Also take amlodipine as prescribed for improved blood pressure control.  Take acetaminophen as need.   Follow up with neurology in 1 week - call office Monday to arrange appointment.  For palpations (and blood pressure) also follow up with cardiologist in the next 1-2 weeks - call office to arrange appointment.  Return to ER if worse, new symptoms, new or severe pain, severe headache, chest pain, persistent fast heart beat, trouble breathing, numbness/weakness, or other concern.

## 2019-01-28 ENCOUNTER — Ambulatory Visit: Payer: Self-pay | Admitting: Neurology

## 2019-01-28 ENCOUNTER — Encounter: Payer: Self-pay | Admitting: Neurology

## 2019-01-28 ENCOUNTER — Other Ambulatory Visit: Payer: Self-pay

## 2019-01-28 VITALS — BP 171/109 | HR 82 | Temp 97.3°F | Ht 63.0 in | Wt 336.2 lb

## 2019-01-28 DIAGNOSIS — Z6841 Body Mass Index (BMI) 40.0 and over, adult: Secondary | ICD-10-CM

## 2019-01-28 DIAGNOSIS — R0683 Snoring: Secondary | ICD-10-CM

## 2019-01-28 DIAGNOSIS — R519 Headache, unspecified: Secondary | ICD-10-CM

## 2019-01-28 DIAGNOSIS — G4726 Circadian rhythm sleep disorder, shift work type: Secondary | ICD-10-CM

## 2019-01-28 DIAGNOSIS — R030 Elevated blood-pressure reading, without diagnosis of hypertension: Secondary | ICD-10-CM

## 2019-01-28 DIAGNOSIS — R351 Nocturia: Secondary | ICD-10-CM

## 2019-01-28 NOTE — Progress Notes (Signed)
Subjective:    Patient ID: Rebecca Collier is a 43 y.o. female.  HPI     [name], MD, PhD [facility] [address] [place], [place] [address]  I saw patient, Rebecca Collier, as a referral from the emergency room, for evaluation of her headaches, Ongoing for the past 3 weeks.  The patient is unaccompanied today.  Rebecca Collier is a 43 year old right-handed woman with an underlying medical history of hypertension, eczema, sciatica, and morbid obesity with a BMI of over 50, who presented to the emergency room on 01/16/2019 with headache, she was treated symptomatically, received Toradol, Reglan, Benadryl, and also was treated for hypertension with Norvasc, she was given magnesium sulfate IV as well.  She had a benign exam, CT head without contrast on 01/16/2019 was done and showed: IMPRESSION: Normal noncontrast CT appearance of the brain aside from partially empty sella, which is often a normal anatomic variant but can be associated with idiopathic intracranial hypertension (pseudotumor cerebri). She has been taking amlodipine 5 mg strength for the past several days.  She has a prescription for hydrochlorothiazide but has not had a refill, she does have some leftover that she has been taking.  She has not seen her primary care physician in over a year she reports.  She reports a pressure-like headache in the bifrontal and left temporal area sometimes.  It is associated with nausea at times but typically no vomiting, no photophobia or sonophobia, not particularly throbbing, she denies any hearing loss or tinnitus or blurry vision or other visual symptoms such as visual field reduction, visual aura, or double vision.  She has not had any one-sided weakness or numbness or tingling or droopy face or slurring of speech.  She has no family history of migraines but she has a strong family history of hypertension.  She has a cousin with sleep apnea and another cousin is  being tested for sleep apnea.  She does snore some.  She lives with her mother, she has no children.  She is a non-smoker and does not currently drink any alcohol since September, never heavy smoker.  She drinks caffeine occasionally in the form of soda.  She drinks herbal tea and tries to hydrate well with water.  She works third shift, she starts work at [employer] and works till 7 or 7:30 AM.  She does not go to bed soon after coming home, generally, she is in bed between 2 or 3 PM and sleeps till 7:30 PM.  She has a hard time sleeping.  She takes an over-the-counter sleep aid.  She gets up to use the bathroom about 3 times once she goes to bed, she has also woken up with a headache.  Her weight has been fairly stable, she had an eye examination last year, she goes to Dr. Gershon Crane.  Her Past Medical History Is Significant For: Past Medical History:  Diagnosis Date  . Eczema   . Hypertension   . Obesity   . Sciatica    spasms daily in left leg     Her Past Surgical History Is Significant For: History reviewed. No pertinent surgical history.  Her Family History Is Significant For: Family History  Problem Relation Age of Onset  . Colon cancer Mother        dx at age  . Hypertension Mother     Her Social History Is Significant For: Social History   Socioeconomic History  . Marital status: Single  Spouse name: Not on file  . Number of children: Not on file  . Years of education: Not on file  . Highest education level: Not on file  Occupational History  . Not on file  Social Needs  . Financial resource strain: Not on file  . Food insecurity    Worry: Not on file    Inability: Not on file  . Transportation needs    Medical: Not on file    Non-medical: Not on file  Tobacco Use  . Smoking status: Never Smoker  . Smokeless tobacco: Never Used  Substance and Sexual Activity  . Alcohol use: Yes    Comment: socially  . Drug use: No  . Sexual activity: Not on file  Lifestyle  .  Physical activity    Days per week: Not on file    Minutes per session: Not on file  . Stress: Not on file  Relationships  . Social Herbalist on phone: Not on file    Gets together: Not on file    Attends religious service: Not on file    Active member of club or organization: Not on file    Attends meetings of clubs or organizations: Not on file    Relationship status: Not on file  Other Topics Concern  . Not on file  Social History Narrative   Work or School: Quarry manager, Physicist, 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      Home Situation: lives with mother      Spiritual Beliefs: none      Lifestyle: no regular exercise; diet is ok          Her Allergies Are:  Allergies  Allergen Reactions  . Penicillins Rash  :   Her Current Medications Are:  Outpatient Encounter Medications as of 01/28/2019  Medication Sig  . albuterol (VENTOLIN HFA) 108 (90 Base) MCG/ACT inhaler Inhale 2 puffs into the lungs every 4 (four) hours as needed for wheezing or shortness of breath.  Marland Kitchen amLODipine (NORVASC) 5 MG tablet Take 1 tablet (5 mg total) by mouth daily.  . diphenhydrAMINE (BENADRYL) 25 MG tablet Take 25 mg by mouth every 6 (six) hours as needed for itching.  . hydrochlorothiazide (HYDRODIURIL) 25 MG tablet TAKE 1 TABLET BY MOUTH DAILY (Patient taking differently: Take 25 mg by mouth daily. )  . ibuprofen (ADVIL) 200 MG tablet Take 600 mg by mouth every 6 (six) hours as needed for moderate pain.  Marland Kitchen metoCLOPramide (REGLAN) 10 MG tablet Take 1 tablet (10 mg total) by mouth every 8 (eight) hours as needed for nausea (nausea/headache).  . triamcinolone cream (KENALOG) 0.1 % APPLY TO AFFECTED AREA TWICE A DAY (Patient taking differently: Apply 1 application topically 2 (two) times daily. )  . [DISCONTINUED] doxycycline (VIBRAMYCIN) 100 MG capsule Take 1 capsule (100 mg total) by mouth 2 (two) times daily. (Patient not taking: Reported on 01/16/2019)  . [DISCONTINUED] losartan-hydrochlorothiazide (HYZAAR)  50-12.5 MG per tablet Take 1 tablet by mouth daily. (Patient not taking: Reported on 01/16/2019)  . [DISCONTINUED] naproxen (NAPROSYN) 500 MG tablet Take 1 tablet (500 mg total) by mouth 2 (two) times daily. (Patient not taking: Reported on 01/16/2019)  . [DISCONTINUED] predniSONE (DELTASONE) 20 MG tablet Take 2 tablets (40 mg total) by mouth daily with breakfast. (Patient not taking: Reported on 01/16/2019)   No facility-administered encounter medications on file as of 01/28/2019.   :  Review of Systems:  Out of a complete 14 point review of systems, all  are reviewed and negative with the exception of these symptoms as listed below: Review of Systems  Neurological:       Rm 1, alone. Hospital referral for Headaches.  She stated 2 wks ago had L sharp pain then radiated to neck pain, headache, nausea.  Seen in ED for headache and Hypertension recently.     Objective:  Neurological Exam  Physical Exam Physical Examination:   Vitals:   01/28/19 0909  BP: (!) 171/109  Pulse: 82  Temp: (!) 97.3 F (36.3 C)    General Examination: The patient is a very pleasant 43 y.o. female in no acute distress. She appears well-developed and well-nourished and well groomed.   HEENT: Normocephalic, atraumatic, pupils are equal, round and reactive to light and accommodation. Funduscopic exam is normal with sharp disc margins noted. Extraocular tracking is good without limitation to gaze excursion or nystagmus noted. Normal smooth pursuit is noted. Visual fields are full by finger perimetry.  Hearing is grossly intact. Face is symmetric with normal facial animation and normal facial sensation. Speech is clear with no dysarthria noted. There is no hypophonia. There is no lip, neck/head, jaw or voice tremor. Neck is supple with full range of passive and active motion. There are no carotid bruits on auscultation. Oropharynx exam reveals: mild mouth dryness, adequate dental hygiene and moderate airway crowding,  due to Tonsillar size of 2+, slightly smaller airway noted, Mallampati class II, neck circumference of 16-1/8 inches.  She has a mild overbite.  Tongue protrudes centrally in palate elevates symmetrically.  Chest: Clear to auscultation without wheezing, rhonchi or crackles noted.  Heart: S1+S2+0, regular and normal without murmurs, rubs or gallops noted.   Abdomen: Soft, non-tender and non-distended with normal bowel sounds appreciated on auscultation.  Extremities: There is trace pitting edema in the distal lower extremities bilaterally.   Skin: Warm and dry without trophic changes noted.  Musculoskeletal: exam reveals no obvious joint deformities, tenderness or joint swelling or erythema.   Neurologically:  Mental status: The patient is awake, alert and oriented in all 4 spheres. Her immediate and remote memory, attention, language skills and fund of knowledge are appropriate. There is no evidence of aphasia, agnosia, apraxia or anomia. Speech is clear with normal prosody and enunciation. Thought process is linear. Mood is normal and affect is normal.  Cranial nerves II - XII are as described above under HEENT exam. In addition: shoulder shrug is normal with equal shoulder height noted. Motor exam: Normal bulk, strength and tone is noted. There is no drift, tremor or rebound. Romberg is negative. Reflexes are 1+ In the upper extremities, trace in the lower extremities. Babinski: Toes are flexor bilaterally. Fine motor skills and coordination: intact with normal finger taps, normal hand movements, normal rapid alternating patting, normal foot taps and normal foot agility.  Cerebellar testing: No dysmetria or intention tremor on finger to nose testing. Heel to shin is unremarkable bilaterally. There is no truncal or gait ataxia.  Sensory exam: intact to light touch, vibration, temperature sense in the upper and lower extremities.  Gait, station and balance: She stands easily. No veering to one  side is noted. No leaning to one side is noted. Posture is age-appropriate and stance is narrow based. Gait shows normal stride length and normal pace. No problems turning are noted. Tandem walk is unremarkable.               Assessment and Plan   In summary, Rebecca Collier is a very pleasant  43 y.o.-year old female with an underlying medical history of hypertension, eczema, sciatica, and morbid obesity with a BMI of over 50, who Presents as a referral from the emergency room for evaluation of her recurrent headaches.  Her headache description is not very migrainous, could be related to significant hypertension which is still suboptimally treated.  She is on hydrochlorothiazide and has been on amlodipine for the past 1+ week.  She is strongly encouraged to follow-up with her primary care physician for hypertension management.  She is at risk for underlying obstructive sleep apnea given a family history of OSA, crowded airway noted, slightly enlarged neck size and morbid obesity.  In addition, she snores, has significant nocturia and has sleep disruption.  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  She is chronically sleep deprived as well which would contribute to her headaches as well.  Underlying obstructive sleep apnea would be an additional risk factor for recurrent headaches.I talked to the patient at length today.  Neurological exam and eye examination are nonfocal today which is reassuring.  Nevertheless, I would recommend a formal, dilated eye examination, given the incidental finding of an empty sellar on the head CT Recently.  I explained to her that this is typically a incidental finding but has also been linked to intracranial hypertension.  She denies any visual symptoms. I plan to see her back after sleep testing.  She is encouraged to make a follow-up appointment as soon as possible with her primary care physician and also a appointment with her eye doctor.  I answered all her  questions today and she was in agreement.   [name], MD, PhD

## 2019-01-28 NOTE — Patient Instructions (Signed)
It was nice to meet you today!  Here is what we discussed today and what we came up with as our plan for you:   1. Please make a follow-up appointment with your primary care physician as soon as possible for management of your high blood pressure, it is still quite high. This may be causing your headaches, and underlying obstructive sleep apnea may also be a contributor for your headaches and blood pressure increase.  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  Sleep deprivation can cause headaches as well.  Your headaches are not very migrainous sounding.  Based on your symptoms and your exam I believe you are at risk for obstructive sleep apnea (aka OSA), and I think we should proceed with a sleep study to determine whether you do or do not have OSA and how severe it is. Even, if you have mild OSA, I may want you to consider treatment with CPAP, as treatment of even borderline or mild sleep apnea can result in improvement of symptoms such as sleep disruption, daytime sleepiness, nighttime bathroom breaks, restless leg symptoms, improvement of headache syndromes, even improved mood disorder.   Please remember, the long-term risks and ramifications of untreated moderate to severe obstructive sleep apnea are: increased Cardiovascular disease, including congestive heart failure, stroke, difficult to control hypertension, treatment resistant obesity, arrhythmias, especially irregular heartbeat commonly known as A. Fib. (atrial fibrillation); even type 2 diabetes has been linked to untreated OSA.   Your neurological exam is nonfocal thankfully.  Nevertheless, since they saw on your recent CT scan what is called an empty sella, it is important that you get a recheck on your eye exam with a formal dilated eye examination, please call Dr. Kellie Moor office for a eye examination especially of your eye background and your optic nerves. Your eye examination with me was normal today.  I  plan to see you back after sleep testing.

## 2019-01-29 ENCOUNTER — Other Ambulatory Visit: Payer: Self-pay | Admitting: Family Medicine

## 2019-03-06 ENCOUNTER — Encounter: Payer: Self-pay | Admitting: Internal Medicine

## 2019-03-11 ENCOUNTER — Encounter: Payer: Self-pay | Admitting: Registered Nurse

## 2019-03-11 ENCOUNTER — Other Ambulatory Visit: Payer: Self-pay

## 2019-03-11 ENCOUNTER — Ambulatory Visit (INDEPENDENT_AMBULATORY_CARE_PROVIDER_SITE_OTHER): Payer: Managed Care, Other (non HMO) | Admitting: Registered Nurse

## 2019-03-11 ENCOUNTER — Other Ambulatory Visit: Payer: Self-pay | Admitting: Registered Nurse

## 2019-03-11 VITALS — BP 140/88 | HR 97 | Temp 98.2°F | Resp 16 | Ht 66.0 in | Wt 327.0 lb

## 2019-03-11 DIAGNOSIS — F5104 Psychophysiologic insomnia: Secondary | ICD-10-CM | POA: Diagnosis not present

## 2019-03-11 DIAGNOSIS — F411 Generalized anxiety disorder: Secondary | ICD-10-CM

## 2019-03-11 DIAGNOSIS — Z130 Encounter for screening for diseases of the blood and blood-forming organs and certain disorders involving the immune mechanism: Secondary | ICD-10-CM

## 2019-03-11 DIAGNOSIS — R0683 Snoring: Secondary | ICD-10-CM

## 2019-03-11 DIAGNOSIS — R69 Illness, unspecified: Secondary | ICD-10-CM | POA: Diagnosis not present

## 2019-03-11 DIAGNOSIS — Z13220 Encounter for screening for lipoid disorders: Secondary | ICD-10-CM

## 2019-03-11 DIAGNOSIS — R21 Rash and other nonspecific skin eruption: Secondary | ICD-10-CM

## 2019-03-11 DIAGNOSIS — H547 Unspecified visual loss: Secondary | ICD-10-CM

## 2019-03-11 MED ORDER — TRIAMCINOLONE ACETONIDE 0.1 % EX CREA: TOPICAL_CREAM | CUTANEOUS | 0 refills | Status: DC

## 2019-03-11 MED ORDER — TRAZODONE HCL 50 MG PO TABS: 25.0000 mg | ORAL_TABLET | Freq: Every evening | ORAL | 3 refills | Status: DC | PRN

## 2019-03-11 MED ORDER — ESCITALOPRAM OXALATE 10 MG PO TABS: 10.0000 mg | ORAL_TABLET | Freq: Every day | ORAL | 0 refills | Status: DC

## 2019-03-11 MED ORDER — DIPHENHYDRAMINE HCL 25 MG PO TABS: 25.0000 mg | ORAL_TABLET | Freq: Four times a day (QID) | ORAL | 0 refills | Status: DC | PRN

## 2019-03-11 NOTE — Progress Notes (Signed)
Established Patient Office Visit  Subjective:  Patient ID: Rebecca Collier, female    DOB: Jul 18, 1975  Age: 44 y.o. MRN: [id number]  CC:  Chief Complaint  Patient presents with  . Establish Care  . Headache    per patient was at hospital end of Nov 2020 with nausea and dizziness   . Eczema    flare up x 2 months    HPI Rebecca Collier presents for visit to establish care. Has a number of issues she is hoping to address.   Headaches: believes they are stress related. Works third shift in [employer]. Headaches almost constantly, not changing. Often L sided, otherwise generalized. No visual or other sensory changes. No peripheral numbness, weakness, tingling. No other CV symptoms at this time.  Eczema: having a flare that's gradually worsening for around 6-8 weeks. Reports that this happened after switching detergents, has since switched back. Is aware her skin is sensitive, knows there are many products she cannot use without having a skin eruption. Itching, many areas across arms, trunk,and upper legs - generally where clothing has touched.  We discussed a high result on her PHQ as well - she admits that her mental health has been a point of concern for her, as she thinks it is related to her headaches.  We also briefly discussed sleep - reports she sleeps poorly, often only around 3 hours each night. Snores frequently. Wakes frequently. Trouble falling asleep and staying asleep.  Past Medical History:  Diagnosis Date  . Eczema   . Hypertension   . Obesity   . Sciatica    spasms daily in left leg     History reviewed. No pertinent surgical history.  Family History  Problem Relation Age of Onset  . Colon cancer Mother        dx at age  . Hypertension Mother     Social History   Socioeconomic History  . Marital status: Single    Spouse name: Not on file  . Number of children: Not on file  . Years of education: Not on file  . Highest education level: Not on file  Occupational  History  . Not on file  Tobacco Use  . Smoking status: Never Smoker  . Smokeless tobacco: Never Used  Substance and Sexual Activity  . Alcohol use: Yes    Comment: socially  . Drug use: No  . Sexual activity: Not on file  Other Topics Concern  . Not on file  Social History Narrative   Work or School: Quarry manager, Physicist, 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      Home Situation: lives with mother      Spiritual Beliefs: none      Lifestyle: no regular exercise; diet is ok         Social Determinants of Radio broadcast assistant Strain:   . Difficulty of Paying Living Expenses: Not on file  Food Insecurity:   . Worried About Charity fundraiser in the Last Year: Not on file  . Ran Out of Food in the Last Year: Not on file  Transportation Needs:   . Lack of Transportation (Medical): Not on file  . Lack of Transportation (Non-Medical): Not on file  Physical Activity:   . Days of Exercise per Week: Not on file  . Minutes of Exercise per Session: Not on file  Stress:   . Feeling of Stress : Not on file  Social Connections:   . Frequency of Communication with Friends  and Family: Not on file  . Frequency of Social Gatherings with Friends and Family: Not on file  . Attends Religious Services: Not on file  . Active Member of Clubs or Organizations: Not on file  . Attends Archivist Meetings: Not on file  . Marital Status: Not on file  Intimate Partner Violence:   . Fear of Current or Ex-Partner: Not on file  . Emotionally Abused: Not on file  . Physically Abused: Not on file  . Sexually Abused: Not on file    Outpatient Medications Prior to Visit  Medication Sig Dispense Refill  . albuterol (VENTOLIN HFA) 108 (90 Base) MCG/ACT inhaler Inhale 2 puffs into the lungs every 4 (four) hours as needed for wheezing or shortness of breath. 18 g 2  . amLODipine (NORVASC) 5 MG tablet Take 1 tablet (5 mg total) by mouth daily. 30 tablet 0  . BLACK CURRANT SEED OIL PO Take by mouth daily.     . Ferrous Sulfate (IRON PO) Take by mouth daily.    . hydrochlorothiazide (HYDRODIURIL) 25 MG tablet TAKE 1 TABLET BY MOUTH DAILY (Patient taking differently: Take 25 mg by mouth daily. ) 90 tablet 0  . ibuprofen (ADVIL) 200 MG tablet Take 600 mg by mouth every 6 (six) hours as needed for moderate pain.    . Magnesium Hydroxide (MAGNESIA PO) Take by mouth daily.    Marland Kitchen OVER THE COUNTER MEDICATION daily.    Marland Kitchen OVER THE COUNTER MEDICATION daily.    Marland Kitchen VITAMIN D PO Take by mouth.    . metoCLOPramide (REGLAN) 10 MG tablet Take 1 tablet (10 mg total) by mouth every 8 (eight) hours as needed for nausea (nausea/headache). (Patient not taking: Reported on 03/11/2019) 6 tablet 0  . diphenhydrAMINE (BENADRYL) 25 MG tablet Take 25 mg by mouth every 6 (six) hours as needed for itching.    . triamcinolone cream (KENALOG) 0.1 % APPLY TO AFFECTED AREA TWICE A DAY (Patient not taking: Reported on 03/11/2019) 30 g 0   No facility-administered medications prior to visit.    Allergies  Allergen Reactions  . Penicillins Rash    ROS Review of Systems  Constitutional: Positive for fatigue. Negative for activity change, appetite change, chills, diaphoresis, fever and unexpected weight change.  HENT: Negative.   Eyes: Negative.   Respiratory: Negative.   Cardiovascular: Negative.   Gastrointestinal: Negative.   Endocrine: Negative.   Genitourinary: Negative.   Musculoskeletal: Negative.   Skin: Positive for rash. Negative for color change, pallor and wound.  Allergic/Immunologic: Negative.   Neurological: Positive for headaches. Negative for dizziness, weakness, light-headedness and numbness.  Hematological: Negative.   Psychiatric/Behavioral: Positive for decreased concentration and sleep disturbance. Negative for suicidal ideas. The patient is nervous/anxious.   All other systems reviewed and are negative.     Objective:    Physical Exam  Constitutional: She is oriented to person, place, and time. She  appears well-developed and well-nourished. No distress.  Cardiovascular: Normal rate, regular rhythm and normal heart sounds. Exam reveals no gallop and no friction rub.  No murmur heard. Pulmonary/Chest: Effort normal and breath sounds normal. No respiratory distress. She has no wheezes. She has no rales. She exhibits no tenderness.  Neurological: She is alert and oriented to person, place, and time.  Skin: Skin is warm and dry. Rash (across both arms, trunk, legs. Hyperpigmented, flat, mildly warm) noted. She is not diaphoretic. No erythema. No pallor.  Psychiatric: She has a normal mood and affect. Her  behavior is normal. Judgment and thought content normal.  Nursing note and vitals reviewed.   BP 140/88   Pulse 97   Temp 98.2 F (36.8 C) (Temporal)   Resp 16   Ht 5' 6"  (1.676 m) Comment: per pt  Wt (!) 327 lb (148.3 kg)   LMP 02/12/2019   SpO2 100%   BMI 52.78 kg/m  Wt Readings from Last 3 Encounters:  03/11/19 (!) 327 lb (148.3 kg)  01/28/19 (!) 336 lb 3.2 oz (152.5 kg)  01/18/19 300 lb (136.1 kg)     Health Maintenance Due  Topic Date Due  . PAP SMEAR-Modifier  10/15/1996    There are no preventive care reminders to display for this patient.  Lab Results  Component Value Date   TSH 1.01 03/24/2015   Lab Results  Component Value Date   WBC 6.8 01/18/2019   HGB 14.5 01/18/2019   HCT 45.5 01/18/2019   MCV 89.6 01/18/2019   PLT 297 01/18/2019   Lab Results  Component Value Date   NA 135 01/18/2019   K 3.7 01/18/2019   CO2 25 01/18/2019   GLUCOSE 124 (H) 01/18/2019   BUN 15 01/18/2019   CREATININE 0.82 01/18/2019   BILITOT 0.2 (L) 10/12/2013   ALKPHOS 116 10/12/2013   AST 37 10/12/2013   ALT 34 10/12/2013   PROT 8.8 (H) 10/12/2013   ALBUMIN 4.0 10/12/2013   CALCIUM 10.2 01/18/2019   ANIONGAP 9 01/18/2019   GFR 103.96 03/24/2015   Lab Results  Component Value Date   CHOL 205 (H) 03/24/2015   Lab Results  Component Value Date   HDL 57.80  03/24/2015   Lab Results  Component Value Date   LDLCALC 125 (H) 09/08/2014   Lab Results  Component Value Date   TRIG 145.0 09/08/2014   Lab Results  Component Value Date   CHOLHDL 4 09/08/2014   Lab Results  Component Value Date   HGBA1C 5.9 03/24/2015      Assessment & Plan:   Problem List Items Addressed This Visit    None    Visit Diagnoses    Rash and nonspecific skin eruption    -  Primary   Relevant Medications   triamcinolone cream (KENALOG) 0.1 %   diphenhydrAMINE (BENADRYL) 25 MG tablet   Psychophysiological insomnia       Relevant Medications   traZODone (DESYREL) 50 MG tablet   GAD (generalized anxiety disorder)       Relevant Medications   escitalopram (LEXAPRO) 10 MG tablet   traZODone (DESYREL) 50 MG tablet   Other Relevant Orders   Ambulatory referral to Psychology   Visual acuity reduced       Relevant Orders   Ambulatory referral to Ophthalmology      Meds ordered this encounter  Medications  . triamcinolone cream (KENALOG) 0.1 %    Sig: APPLY TO AFFECTED AREA TWICE A DAY    Dispense:  30 g    Refill:  0    Patient needs an appt  . diphenhydrAMINE (BENADRYL) 25 MG tablet    Sig: Take 1 tablet (25 mg total) by mouth every 6 (six) hours as needed for itching.    Dispense:  30 tablet    Refill:  0  . escitalopram (LEXAPRO) 10 MG tablet    Sig: Take 1 tablet (10 mg total) by mouth daily.    Dispense:  90 tablet    Refill:  0    Order Specific Question:  Supervising Provider    Answer:   Forrest Moron [id number]  . traZODone (DESYREL) 50 MG tablet    Sig: Take 0.5-1 tablets (25-50 mg total) by mouth at bedtime as needed for sleep.    Dispense:  30 tablet    Refill:  3    Order Specific Question:   Supervising Provider    Answer:   Forrest Moron [id number]    Follow-up: Return in about 6 weeks (around 04/22/2019) for med check, CPE, pap .   PLAN  Will start lexapro 83m PO qd for anxiety/depression. Take upon waking, discussed  side effects  Trazodone 25-511mPO qhs PRN, take about 30 minutes before going to bed.   Refer to sleep study with pulmonology  Triamcinolone, benadryl, and prednisone burst for rash. Discussed nonpharm  Headache sounds like it may be resolved with control of likely OSA and stress levels  Return in 6 weeks for med check, CPE, pap with [facility].  Patient encouraged to call clinic with any questions, comments, or concerns.  RiMaximiano CossNP

## 2019-03-11 NOTE — Patient Instructions (Addendum)
If you have lab work done today you will be contacted with your lab results within the next 2 weeks.  If you have not heard from [place] then please contact us. The fastest way to get your results is to register for My Chart.   IF you received an x-ray today, you will receive an invoice from [facility]. Please contact [facility] at [phone] with questions or concerns regarding your invoice.   IF you received labwork today, you will receive an invoice from [place]. Please contact [facility] at [phone] with questions or concerns regarding your invoice.   Our billing staff will not be able to assist you with questions regarding bills from these companies.  You will be contacted with the lab results as soon as they are available. The fastest way to get your results is to activate your My Chart account. Instructions are located on the last page of this paperwork. If you have not heard from [place] regarding the results in 2 weeks, please contact this office.    Escitalopram tablets What is this medicine? ESCITALOPRAM (es sye TAL oh pram) is used to treat depression and certain types of anxiety. This medicine may be used for other purposes; ask your health care provider or pharmacist if you have questions. COMMON BRAND NAME(S): Lexapro What should I tell my health care provider before I take this medicine? They need to know if you have any of these conditions:  bipolar disorder or a family history of bipolar disorder  diabetes  glaucoma  heart disease  kidney or liver disease  receiving electroconvulsive therapy  seizures (convulsions)  suicidal thoughts, plans, or attempt by you or a family member  an unusual or allergic reaction to escitalopram, the related drug citalopram, other medicines, foods, dyes, or preservatives  pregnant or trying to become pregnant  breast-feeding How should I use this medicine? Take this medicine by mouth with a glass of  water. Follow the directions on the prescription label. You can take it with or without food. If it upsets your stomach, take it with food. Take your medicine at regular intervals. Do not take it more often than directed. Do not stop taking this medicine suddenly except upon the advice of your doctor. Stopping this medicine too quickly may cause serious side effects or your condition may worsen. A special MedGuide will be given to you by the pharmacist with each prescription and refill. Be sure to read this information carefully each time. Talk to your pediatrician regarding the use of this medicine in children. Special care may be needed. Overdosage: If you think you have taken too much of this medicine contact a poison control center or emergency room at once. NOTE: This medicine is only for you. Do not share this medicine with others. What if I miss a dose? If you miss a dose, take it as soon as you can. If it is almost time for your next dose, take only that dose. Do not take double or extra doses. What may interact with this medicine? Do not take this medicine with any of the following medications:  certain medicines for fungal infections like fluconazole, itraconazole, ketoconazole, posaconazole, voriconazole  cisapride  citalopram  dronedarone  linezolid  MAOIs like Carbex, Eldepryl, Marplan, Nardil, and Parnate  methylene blue (injected into a vein)  pimozide  thioridazine This medicine may also interact with the following medications:  alcohol  amphetamines  aspirin and aspirin-like medicines  carbamazepine  certain medicines for depression,  anxiety, or psychotic disturbances  certain medicines for migraine headache like almotriptan, eletriptan, frovatriptan, naratriptan, rizatriptan, sumatriptan, zolmitriptan  certain medicines for sleep  certain medicines that treat or prevent blood clots like warfarin, enoxaparin,  dalteparin  cimetidine  diuretics  dofetilide  fentanyl  furazolidone  isoniazid  lithium  metoprolol  NSAIDs, medicines for pain and inflammation, like ibuprofen or naproxen  other medicines that prolong the QT interval (cause an abnormal heart rhythm)  procarbazine  rasagiline  supplements like St. John's wort, kava kava, valerian  tramadol  tryptophan  ziprasidone This list may not describe all possible interactions. Give your health care provider a list of all the medicines, herbs, non-prescription drugs, or dietary supplements you use. Also tell them if you smoke, drink alcohol, or use illegal drugs. Some items may interact with your medicine. What should I watch for while using this medicine? Tell your doctor if your symptoms do not get better or if they get worse. Visit your doctor or health care professional for regular checks on your progress. Because it may take several weeks to see the full effects of this medicine, it is important to continue your treatment as prescribed by your doctor. Patients and their families should watch out for new or worsening thoughts of suicide or depression. Also watch out for sudden changes in feelings such as feeling anxious, agitated, panicky, irritable, hostile, aggressive, impulsive, severely restless, overly excited and hyperactive, or not being able to sleep. If this happens, especially at the beginning of treatment or after a change in dose, call your health care professional. Dennis Bast may get drowsy or dizzy. Do not drive, use machinery, or do anything that needs mental alertness until you know how this medicine affects you. Do not stand or sit up quickly, especially if you are an older patient. This reduces the risk of dizzy or fainting spells. Alcohol may interfere with the effect of this medicine. Avoid alcoholic drinks. Your mouth may get dry. Chewing sugarless gum or sucking hard candy, and drinking plenty of water may help.  Contact your doctor if the problem does not go away or is severe. What side effects may I notice from receiving this medicine? Side effects that you should report to your doctor or health care professional as soon as possible:  allergic reactions like skin rash, itching or hives, swelling of the face, lips, or tongue  anxious  black, tarry stools  changes in vision  confusion  elevated mood, decreased need for sleep, racing thoughts, impulsive behavior  eye pain  fast, irregular heartbeat  feeling faint or lightheaded, falls  feeling agitated, angry, or irritable  hallucination, loss of contact with reality  loss of balance or coordination  loss of memory  painful or prolonged erections  restlessness, pacing, inability to keep still  seizures  stiff muscles  suicidal thoughts or other mood changes  trouble sleeping  unusual bleeding or bruising  unusually weak or tired  vomiting Side effects that usually do not require medical attention (report to your doctor or health care professional if they continue or are bothersome):  changes in appetite  change in sex drive or performance  headache  increased sweating  indigestion, nausea  tremors This list may not describe all possible side effects. Call your doctor for medical advice about side effects. You may report side effects to FDA at 1-800-FDA-1088. Where should I keep my medicine? Keep out of reach of children. Store at room temperature between 15 and 30 degrees C (59  and 86 degrees F). Throw away any unused medicine after the expiration date. NOTE: This sheet is a summary. It may not cover all possible information. If you have questions about this medicine, talk to your doctor, pharmacist, or health care provider.  2020 Elsevier/Gold Standard (2018-02-04 11:21:44)  Sleep Apnea Sleep apnea is a condition in which breathing pauses or becomes shallow during sleep. Episodes of sleep apnea usually last  10 seconds or longer, and they may occur as many as 20 times an hour. Sleep apnea disrupts your sleep and keeps your body from getting the rest that it needs. This condition can increase your risk of certain health problems, including:  Heart attack.  Stroke.  Obesity.  Diabetes.  Heart failure.  Irregular heartbeat. What are the causes? There are three kinds of sleep apnea:  Obstructive sleep apnea. This kind is caused by a blocked or collapsed airway.  Central sleep apnea. This kind happens when the part of the brain that controls breathing does not send the correct signals to the muscles that control breathing.  Mixed sleep apnea. This is a combination of obstructive and central sleep apnea. The most common cause of this condition is a collapsed or blocked airway. An airway can collapse or become blocked if:  Your throat muscles are abnormally relaxed.  Your tongue and tonsils are larger than normal.  You are overweight.  Your airway is smaller than normal. What increases the risk? You are more likely to develop this condition if you:  Are overweight.  Smoke.  Have a smaller than normal airway.  Are elderly.  Are female.  Drink alcohol.  Take sedatives or tranquilizers.  Have a family history of sleep apnea. What are the signs or symptoms? Symptoms of this condition include:  Trouble staying asleep.  Daytime sleepiness and tiredness.  Irritability.  Loud snoring.  Morning headaches.  Trouble concentrating.  Forgetfulness.  Decreased interest in sex.  Unexplained sleepiness.  Mood swings.  Personality changes.  Feelings of depression.  Waking up often during the night to urinate.  Dry mouth.  Sore throat. How is this diagnosed? This condition may be diagnosed with:  A medical history.  A physical exam.  A series of tests that are done while you are sleeping (sleep study). These tests are usually done in a sleep lab, but they may  also be done at home. How is this treated? Treatment for this condition aims to restore normal breathing and to ease symptoms during sleep. It may involve managing health issues that can affect breathing, such as high blood pressure or obesity. Treatment may include:  Sleeping on your side.  Using a decongestant if you have nasal congestion.  Avoiding the use of depressants, including alcohol, sedatives, and narcotics.  Losing weight if you are overweight.  Making changes to your diet.  Quitting smoking.  Using a device to open your airway while you sleep, such as: ? An oral appliance. This is a custom-made mouthpiece that shifts your lower jaw forward. ? A continuous positive airway pressure (CPAP) device. This device blows air through a mask when you breathe out (exhale). ? A nasal expiratory positive airway pressure (EPAP) device. This device has valves that you put into each nostril. ? A bi-level positive airway pressure (BPAP) device. This device blows air through a mask when you breathe in (inhale) and breathe out (exhale).  Having surgery if other treatments do not work. During surgery, excess tissue is removed to create a wider airway. It  is important to get treatment for sleep apnea. Without treatment, this condition can lead to:  High blood pressure.  Coronary artery disease.  In men, an inability to achieve or maintain an erection (impotence).  Reduced thinking abilities. Follow these instructions at home: Lifestyle  Make any lifestyle changes that your health care provider recommends.  Eat a healthy, well-balanced diet.  Take steps to lose weight if you are overweight.  Avoid using depressants, including alcohol, sedatives, and narcotics.  Do not use any products that contain nicotine or tobacco, such as cigarettes, e-cigarettes, and chewing tobacco. If you need help quitting, ask your health care provider. General instructions  Take over-the-counter and  prescription medicines only as told by your health care provider.  If you were given a device to open your airway while you sleep, use it only as told by your health care provider.  If you are having surgery, make sure to tell your health care provider you have sleep apnea. You may need to bring your device with you.  Keep all follow-up visits as told by your health care provider. This is important. Contact a health care provider if:  The device that you received to open your airway during sleep is uncomfortable or does not seem to be working.  Your symptoms do not improve.  Your symptoms get worse. Get help right away if:  You develop: ? Chest pain. ? Shortness of breath. ? Discomfort in your back, arms, or stomach.  You have: ? Trouble speaking. ? Weakness on one side of your body. ? Drooping in your face. These symptoms may represent a serious problem that is an emergency. Do not wait to see if the symptoms will go away. Get medical help right away. Call your local emergency services (911 in the [place] Do not drive yourself to the hospital. Summary  Sleep apnea is a condition in which breathing pauses or becomes shallow during sleep.  The most common cause is a collapsed or blocked airway.  The goal of treatment is to restore normal breathing and to ease symptoms during sleep. This information is not intended to replace advice given to you by your health care provider. Make sure you discuss any questions you have with your health care provider. Document Revised: 07/31/2018 Document Reviewed: 10/09/2017 Elsevier Patient Education  2020 [place] With Sleep Apnea Sleep apnea is a condition in which breathing pauses or becomes shallow during sleep. Sleep apnea is most commonly caused by a collapsed or blocked airway. People with sleep apnea snore loudly and have times when they gasp and stop breathing for 10 seconds or more during sleep. This happens over and over  during the night. This disrupts your sleep and keeps your body from getting the rest that it needs, which can cause tiredness and lack of energy (fatigue) during the day. The breaks in breathing also interrupt the deep sleep that you need to feel rested. Even if you do not completely wake up from the gaps in breathing, your sleep may not be restful. You may also have a headache in the morning and low energy during the day, and you may feel anxious or depressed. How can sleep apnea affect me? Sleep apnea increases your chances of extreme tiredness during the day (daytime fatigue). It can also increase your risk for health conditions, such as:  Heart attack.  Stroke.  Diabetes.  Heart failure.  Irregular heartbeat.  High blood pressure. If you have daytime fatigue as a result  of sleep apnea, you may be more likely to:  Perform poorly at school or work.  Fall asleep while driving.  Have difficulty with attention.  Develop depression or anxiety.  Become severely overweight (obese).  Have sexual dysfunction. What actions can I take to manage sleep apnea? Sleep apnea treatment   If you were given a device to open your airway while you sleep, use it only as told by your health care provider. You may be given: ? An oral appliance. This is a custom-made mouthpiece that shifts your lower jaw forward. ? A continuous positive airway pressure (CPAP) device. This device blows air through a mask when you breathe out (exhale). ? A nasal expiratory positive airway pressure (EPAP) device. This device has valves that you put into each nostril. ? A bi-level positive airway pressure (BPAP) device. This device blows air through a mask when you breathe in (inhale) and breathe out (exhale).  You may need surgery if other treatments do not work for you. Sleep habits  Go to sleep and wake up at the same time every day. This helps set your internal clock (circadian rhythm) for sleeping. ? If you stay  up later than usual, such as on weekends, try to get up in the morning within 2 hours of your normal wake time.  Try to get at least 7-9 hours of sleep each night.  Stop computer, tablet, and mobile phone use a few hours before bedtime.  Do not take long naps during the day. If you nap, limit it to 30 minutes.  Have a relaxing bedtime routine. Reading or listening to music may relax you and help you sleep.  Use your bedroom only for sleep. ? Keep your television and computer out of your bedroom. ? Keep your bedroom cool, dark, and quiet. ? Use a supportive mattress and pillows.  Follow your health care provider's instructions for other changes to sleep habits. Nutrition  Do not eat heavy meals in the evening.  Do not have caffeine in the later part of the day. The effects of caffeine can last for more than 5 hours.  Follow your health care provider's or dietitian's instructions for any diet changes. Lifestyle      Do not drink alcohol before bedtime. Alcohol can cause you to fall asleep at first, but then it can cause you to wake up in the middle of the night and have trouble getting back to sleep.  Do not use any products that contain nicotine or tobacco, such as cigarettes and e-cigarettes. If you need help quitting, ask your health care provider. Medicines  Take over-the-counter and prescription medicines only as told by your health care provider.  Do not use over-the-counter sleep medicine. You can become dependent on this medicine, and it can make sleep apnea worse.  Do not use medicines, such as sedatives and narcotics, unless told by your health care provider. Activity  Exercise on most days, but avoid exercising in the evening. Exercising near bedtime can interfere with sleeping.  If possible, spend time outside every day. Natural light helps regulate your circadian rhythm. General information  Lose weight if you need to, and maintain a healthy weight.  Keep all  follow-up visits as told by your health care provider. This is important.  If you are having surgery, make sure to tell your health care provider that you have sleep apnea. You may need to bring your device with you. Where to find more information Learn more about sleep apnea  and daytime fatigue from:  American Sleep Association: sleepassociation.[place]: sleepfoundation.org  National Heart, Lung, and Blood Institute: https://www.[place].biz/ Summary  Sleep apnea can cause daytime fatigue and other serious health conditions.  Both sleep apnea and daytime fatigue can be bad for your health and well-being.  You may need to wear a device while sleeping to help keep your airway open.  If you are having surgery, make sure to tell your health care provider that you have sleep apnea. You may need to bring your device with you.  Making changes to sleep habits, diet, lifestyle, and activity can help you manage sleep apnea. This information is not intended to replace advice given to you by your health care provider. Make sure you discuss any questions you have with your health care provider. Document Revised: 06/07/2018 Document Reviewed: 05/10/2017 Elsevier Patient Education  [place].

## 2019-03-12 ENCOUNTER — Other Ambulatory Visit: Payer: Self-pay | Admitting: Registered Nurse

## 2019-03-12 MED ORDER — PREDNISONE 20 MG PO TABS: 40.0000 mg | ORAL_TABLET | Freq: Every day | ORAL | 0 refills | Status: DC

## 2019-03-12 NOTE — Progress Notes (Signed)
Error, please disregard.

## 2019-03-19 ENCOUNTER — Other Ambulatory Visit: Payer: Self-pay

## 2019-03-19 ENCOUNTER — Ambulatory Visit (INDEPENDENT_AMBULATORY_CARE_PROVIDER_SITE_OTHER): Payer: Managed Care, Other (non HMO) | Admitting: Registered Nurse

## 2019-03-19 DIAGNOSIS — Z1329 Encounter for screening for other suspected endocrine disorder: Secondary | ICD-10-CM

## 2019-03-19 DIAGNOSIS — Z13220 Encounter for screening for lipoid disorders: Secondary | ICD-10-CM

## 2019-03-19 DIAGNOSIS — Z130 Encounter for screening for diseases of the blood and blood-forming organs and certain disorders involving the immune mechanism: Secondary | ICD-10-CM

## 2019-03-19 DIAGNOSIS — Z13228 Encounter for screening for other metabolic disorders: Secondary | ICD-10-CM | POA: Diagnosis not present

## 2019-03-20 LAB — COMPREHENSIVE METABOLIC PANEL
ALT: 85 IU/L — ABNORMAL HIGH (ref 0–32)
AST: 70 IU/L — ABNORMAL HIGH (ref 0–40)
Albumin/Globulin Ratio: 1.2 (ref 1.2–2.2)
Albumin: 4 g/dL (ref 3.8–4.8)
Alkaline Phosphatase: 99 IU/L (ref 39–117)
BUN/Creatinine Ratio: 16 (ref 9–23)
BUN: 15 mg/dL (ref 6–24)
Bilirubin Total: 0.3 mg/dL (ref 0.0–1.2)
CO2: 27 mmol/L (ref 20–29)
Calcium: 10 mg/dL (ref 8.7–10.2)
Chloride: 97 mmol/L (ref 96–106)
Creatinine, Ser: 0.95 mg/dL (ref 0.57–1.00)
GFR calc Af Amer: 85 mL/min/{1.73_m2} (ref >59)
GFR calc non Af Amer: 74 mL/min/{1.73_m2} (ref >59)
Globulin, Total: 3.3 g/dL (ref 1.5–4.5)
Glucose: 103 mg/dL — ABNORMAL HIGH (ref 65–99)
Potassium: 3.6 mmol/L (ref 3.5–5.2)
Sodium: 136 mmol/L (ref 134–144)
Total Protein: 7.3 g/dL (ref 6.0–8.5)

## 2019-03-20 LAB — LIPID PANEL
Chol/HDL Ratio: 2.9 ratio (ref 0.0–4.4)
Cholesterol, Total: 211 mg/dL — ABNORMAL HIGH (ref 100–199)
HDL: 73 mg/dL (ref >39)
LDL Chol Calc (NIH): 115 mg/dL — ABNORMAL HIGH (ref 0–99)
Triglycerides: 131 mg/dL (ref 0–149)
VLDL Cholesterol Cal: 23 mg/dL (ref 5–40)

## 2019-03-20 LAB — CBC
Hematocrit: 41.8 % (ref 34.0–46.6)
Hemoglobin: 14 g/dL (ref 11.1–15.9)
MCH: 29 pg (ref 26.6–33.0)
MCHC: 33.5 g/dL (ref 31.5–35.7)
MCV: 87 fL (ref 79–97)
Platelets: 326 10*3/uL (ref 150–450)
RBC: 4.83 x10E6/uL (ref 3.77–5.28)
RDW: 13 % (ref 11.7–15.4)
WBC: 5.9 10*3/uL (ref 3.4–10.8)

## 2019-03-20 LAB — TSH: TSH: 0.744 u[IU]/mL (ref 0.450–4.500)

## 2019-03-20 LAB — HEMOGLOBIN A1C
Est. average glucose Bld gHb Est-mCnc: 126 mg/dL
Hgb A1c MFr Bld: 6 % — ABNORMAL HIGH (ref 4.8–5.6)

## 2019-03-21 ENCOUNTER — Encounter: Payer: Self-pay | Admitting: Registered Nurse

## 2019-03-21 NOTE — Progress Notes (Signed)
Labs returned A1c 6.0 Lipids mildly elevated  Lifestyle modification and start atorvastatin 91m PO qd with dinner  Letter sent via Mychart  Pt returning soon for CPE with BFelton ClintonNP  RKathrin Ruddy NP

## 2019-03-25 ENCOUNTER — Other Ambulatory Visit: Payer: Self-pay | Admitting: Registered Nurse

## 2019-03-25 DIAGNOSIS — R21 Rash and other nonspecific skin eruption: Secondary | ICD-10-CM

## 2019-03-26 ENCOUNTER — Other Ambulatory Visit: Payer: Self-pay | Admitting: Registered Nurse

## 2019-03-26 DIAGNOSIS — R21 Rash and other nonspecific skin eruption: Secondary | ICD-10-CM

## 2019-03-26 NOTE — Telephone Encounter (Signed)
Please advise 

## 2019-03-28 ENCOUNTER — Encounter: Payer: Self-pay | Admitting: Internal Medicine

## 2019-04-03 ENCOUNTER — Other Ambulatory Visit: Payer: Self-pay | Admitting: Registered Nurse

## 2019-04-03 DIAGNOSIS — F5104 Psychophysiologic insomnia: Secondary | ICD-10-CM

## 2019-04-03 NOTE — Telephone Encounter (Signed)
Please advise 

## 2019-04-08 ENCOUNTER — Other Ambulatory Visit: Payer: Self-pay | Admitting: Registered Nurse

## 2019-04-08 DIAGNOSIS — R21 Rash and other nonspecific skin eruption: Secondary | ICD-10-CM

## 2019-04-10 ENCOUNTER — Other Ambulatory Visit: Payer: Self-pay | Admitting: Family Medicine

## 2019-04-10 ENCOUNTER — Other Ambulatory Visit: Payer: Self-pay | Admitting: Registered Nurse

## 2019-04-10 DIAGNOSIS — R21 Rash and other nonspecific skin eruption: Secondary | ICD-10-CM

## 2019-04-10 MED ORDER — TRIAMCINOLONE ACETONIDE 0.1 % EX CREA: TOPICAL_CREAM | CUTANEOUS | 0 refills | Status: DC

## 2019-04-11 MED ORDER — HYDROCHLOROTHIAZIDE 25 MG PO TABS: 25.0000 mg | ORAL_TABLET | Freq: Every day | ORAL | 0 refills | Status: DC

## 2019-04-16 ENCOUNTER — Telehealth: Payer: Self-pay

## 2019-04-16 NOTE — Telephone Encounter (Signed)
We have attempted to call the patient two times to schedule sleep study.  Patient has been unavailable at the phone numbers we have on file and has not returned our calls.   If patient calls back we will schedule them for their sleep study.

## 2019-04-23 ENCOUNTER — Other Ambulatory Visit: Payer: Self-pay

## 2019-04-23 ENCOUNTER — Other Ambulatory Visit (HOSPITAL_COMMUNITY)
Admission: RE | Admit: 2019-04-23 | Discharge: 2019-04-23 | Disposition: A | Discharge status: Home / Self Care | Payer: 59 | Source: Ambulatory Visit | Attending: Adult Health Nurse Practitioner | Admitting: Adult Health Nurse Practitioner

## 2019-04-23 ENCOUNTER — Ambulatory Visit (INDEPENDENT_AMBULATORY_CARE_PROVIDER_SITE_OTHER): Payer: Managed Care, Other (non HMO) | Admitting: Adult Health Nurse Practitioner

## 2019-04-23 VITALS — BP 171/113 | HR 80 | Temp 98.2°F | Ht 66.0 in | Wt 324.6 lb

## 2019-04-23 DIAGNOSIS — E559 Vitamin D deficiency, unspecified: Secondary | ICD-10-CM

## 2019-04-23 DIAGNOSIS — L309 Dermatitis, unspecified: Secondary | ICD-10-CM

## 2019-04-23 DIAGNOSIS — G43709 Chronic migraine without aura, not intractable, without status migrainosus: Secondary | ICD-10-CM | POA: Insufficient documentation

## 2019-04-23 DIAGNOSIS — I10 Essential (primary) hypertension: Secondary | ICD-10-CM | POA: Diagnosis not present

## 2019-04-23 DIAGNOSIS — Z01419 Encounter for gynecological examination (general) (routine) without abnormal findings: Secondary | ICD-10-CM | POA: Diagnosis not present

## 2019-04-23 DIAGNOSIS — F5104 Psychophysiologic insomnia: Secondary | ICD-10-CM

## 2019-04-23 DIAGNOSIS — Z6841 Body Mass Index (BMI) 40.0 and over, adult: Secondary | ICD-10-CM | POA: Diagnosis not present

## 2019-04-23 DIAGNOSIS — Z1151 Encounter for screening for human papillomavirus (HPV): Secondary | ICD-10-CM | POA: Diagnosis not present

## 2019-04-23 DIAGNOSIS — R69 Illness, unspecified: Secondary | ICD-10-CM | POA: Diagnosis not present

## 2019-04-23 DIAGNOSIS — F411 Generalized anxiety disorder: Secondary | ICD-10-CM | POA: Diagnosis not present

## 2019-04-23 MED ORDER — PREDNISONE 20 MG PO TABS: ORAL_TABLET | ORAL | 0 refills | Status: DC

## 2019-04-23 MED ORDER — HYDROXYZINE HCL 25 MG PO TABS: ORAL_TABLET | ORAL | 0 refills | Status: DC

## 2019-04-23 MED ORDER — BUPROPION HCL ER (XL) 150 MG PO TB24: ORAL_TABLET | ORAL | 3 refills | Status: DC

## 2019-04-23 MED ORDER — LISINOPRIL-HYDROCHLOROTHIAZIDE 20-25 MG PO TABS: 1.0000 | ORAL_TABLET | Freq: Every day | ORAL | 3 refills | Status: DC

## 2019-04-23 MED ORDER — METHYLPREDNISOLONE ACETATE 80 MG/ML IJ SUSP
80.0000 mg | Freq: Once | INTRAMUSCULAR | Status: AC
  Administered 2019-04-23: 80 mg via INTRAMUSCULAR

## 2019-04-23 MED ORDER — FLUOCINOLONE ACETONIDE 0.01 % EX CREA: TOPICAL_CREAM | Freq: Two times a day (BID) | CUTANEOUS | 3 refills | Status: AC

## 2019-04-23 MED ORDER — QUETIAPINE FUMARATE 25 MG PO TABS: 25.0000 mg | ORAL_TABLET | Freq: Every day | ORAL | 1 refills | Status: DC

## 2019-04-23 MED ORDER — CLOBETASOL PROP EMOLLIENT BASE 0.05 % EX CREA: TOPICAL_CREAM | CUTANEOUS | 2 refills | Status: DC

## 2019-04-23 NOTE — Patient Instructions (Addendum)
Health Maintenance, Female Adopting a healthy lifestyle and getting preventive care are important in promoting health and wellness. Ask your health care provider about:  The right schedule for you to have regular tests and exams.  Things you can do on your own to prevent diseases and keep yourself healthy. What should I know about diet, weight, and exercise? Eat a healthy diet   Eat a diet that includes plenty of vegetables, fruits, low-fat dairy products, and lean protein.  Do not eat a lot of foods that are high in solid fats, added sugars, or sodium. Maintain a healthy weight Body mass index (BMI) is used to identify weight problems. It estimates body fat based on height and weight. Your health care provider can help determine your BMI and help you achieve or maintain a healthy weight. Get regular exercise Get regular exercise. This is one of the most important things you can do for your health. Most adults should:  Exercise for at least 150 minutes each week. The exercise should increase your heart rate and make you sweat (moderate-intensity exercise).  Do strengthening exercises at least twice a week. This is in addition to the moderate-intensity exercise.  Spend less time sitting. Even light physical activity can be beneficial. Watch cholesterol and blood lipids Have your blood tested for lipids and cholesterol at 44 years of age, then have this test every 5 years. Have your cholesterol levels checked more often if:  Your lipid or cholesterol levels are high.  You are older than 44 years of age.  You are at high risk for heart disease. What should I know about cancer screening? Depending on your health history and family history, you may need to have cancer screening at various ages. This may include screening for:  Breast cancer.  Cervical cancer.  Colorectal cancer.  Skin cancer.  Lung cancer. What should I know about heart disease, diabetes, and high blood  pressure? Blood pressure and heart disease  High blood pressure causes heart disease and increases the risk of stroke. This is more likely to develop in people who have high blood pressure readings, are of African descent, or are overweight.  Have your blood pressure checked: ? Every 3-5 years if you are 18-39 years of age. ? Every year if you are 40 years old or older. Diabetes Have regular diabetes screenings. This checks your fasting blood sugar level. Have the screening done:  Once every three years after age 40 if you are at a normal weight and have a low risk for diabetes.  More often and at a younger age if you are overweight or have a high risk for diabetes. What should I know about preventing infection? Hepatitis B If you have a higher risk for hepatitis B, you should be screened for this virus. Talk with your health care provider to find out if you are at risk for hepatitis B infection. Hepatitis C Testing is recommended for:  Everyone born from 1945 through 1965.  Anyone with known risk factors for hepatitis C. Sexually transmitted infections (STIs)  Get screened for STIs, including gonorrhea and chlamydia, if: ? You are sexually active and are younger than 44 years of age. ? You are older than 44 years of age and your health care provider tells you that you are at risk for this type of infection. ? Your sexual activity has changed since you were last screened, and you are at increased risk for chlamydia or gonorrhea. Ask your health care provider if   you are at risk.  Ask your health care provider about whether you are at high risk for HIV. Your health care provider may recommend a prescription medicine to help prevent HIV infection. If you choose to take medicine to prevent HIV, you should first get tested for HIV. You should then be tested every 3 months for as long as you are taking the medicine. Pregnancy  If you are about to stop having your period (premenopausal) and  you may become pregnant, seek counseling before you get pregnant.  Take 400 to 800 micrograms (mcg) of folic acid every day if you become pregnant.  Ask for birth control (contraception) if you want to prevent pregnancy. Osteoporosis and menopause Osteoporosis is a disease in which the bones lose minerals and strength with aging. This can result in bone fractures. If you are 65 years old or older, or if you are at risk for osteoporosis and fractures, ask your health care provider if you should:  Be screened for bone loss.  Take a calcium or vitamin D supplement to lower your risk of fractures.  Be given hormone replacement therapy (HRT) to treat symptoms of menopause. Follow these instructions at home: Lifestyle  Do not use any products that contain nicotine or tobacco, such as cigarettes, e-cigarettes, and chewing tobacco. If you need help quitting, ask your health care provider.  Do not use street drugs.  Do not share needles.  Ask your health care provider for help if you need support or information about quitting drugs. Alcohol use  Do not drink alcohol if: ? Your health care provider tells you not to drink. ? You are pregnant, may be pregnant, or are planning to become pregnant.  If you drink alcohol: ? Limit how much you use to 0-1 drink a day. ? Limit intake if you are breastfeeding.  Be aware of how much alcohol is in your drink. In the [place] one drink equals one 12 oz bottle of beer (355 mL), one 5 oz glass of wine (148 mL), or one 1 oz glass of hard liquor (44 mL). General instructions  Schedule regular health, dental, and eye exams.  Stay current with your vaccines.  Tell your health care provider if: ? You often feel depressed. ? You have ever been abused or do not feel safe at home. Summary  Adopting a healthy lifestyle and getting preventive care are important in promoting health and wellness.  Follow your health care provider's instructions about healthy  diet, exercising, and getting tested or screened for diseases.  Follow your health care provider's instructions on monitoring your cholesterol and blood pressure. This information is not intended to replace advice given to you by your health care provider. Make sure you discuss any questions you have with your health care provider. Document Revised: 02/06/2018 Document Reviewed: 02/06/2018 Elsevier Patient Education  2020 [employer] Inc[employer]  

## 2019-04-24 LAB — CBC WITH DIFFERENTIAL/PLATELET
Basophils Absolute: 0 10*3/uL (ref 0.0–0.2)
Basos: 1 %
EOS (ABSOLUTE): 0.4 10*3/uL (ref 0.0–0.4)
Eos: 7 %
Hematocrit: 42.8 % (ref 34.0–46.6)
Hemoglobin: 14.2 g/dL (ref 11.1–15.9)
Immature Grans (Abs): 0 10*3/uL (ref 0.0–0.1)
Immature Granulocytes: 0 %
Lymphocytes Absolute: 1.5 10*3/uL (ref 0.7–3.1)
Lymphs: 29 %
MCH: 28.6 pg (ref 26.6–33.0)
MCHC: 33.2 g/dL (ref 31.5–35.7)
MCV: 86 fL (ref 79–97)
Monocytes Absolute: 0.3 10*3/uL (ref 0.1–0.9)
Monocytes: 6 %
Neutrophils Absolute: 3.1 10*3/uL (ref 1.4–7.0)
Neutrophils: 57 %
Platelets: 361 10*3/uL (ref 150–450)
RBC: 4.96 x10E6/uL (ref 3.77–5.28)
RDW: 13.1 % (ref 11.7–15.4)
WBC: 5.4 10*3/uL (ref 3.4–10.8)

## 2019-04-24 LAB — B12 AND FOLATE PANEL
Folate: 11.8 ng/mL (ref >3.0)
Vitamin B-12: 517 pg/mL (ref 232–1245)

## 2019-04-24 LAB — IRON,TIBC AND FERRITIN PANEL
Ferritin: 265 ng/mL — ABNORMAL HIGH (ref 15–150)
Iron Saturation: 18 % (ref 15–55)
Iron: 61 ug/dL (ref 27–159)
Total Iron Binding Capacity: 334 ug/dL (ref 250–450)
UIBC: 273 ug/dL (ref 131–425)

## 2019-04-24 LAB — VITAMIN D 25 HYDROXY (VIT D DEFICIENCY, FRACTURES): Vit D, 25-Hydroxy: 26.6 ng/mL — ABNORMAL LOW (ref 30.0–100.0)

## 2019-04-25 LAB — CYTOLOGY - PAP
Adequacy: ABSENT
Chlamydia: NEGATIVE
Comment: NEGATIVE
Comment: NEGATIVE
Comment: NORMAL
Diagnosis: NEGATIVE
Neisseria Gonorrhea: NEGATIVE
Trichomonas: NEGATIVE

## 2019-05-06 ENCOUNTER — Encounter: Payer: Self-pay | Admitting: Adult Health Nurse Practitioner

## 2019-05-06 DIAGNOSIS — E559 Vitamin D deficiency, unspecified: Secondary | ICD-10-CM | POA: Insufficient documentation

## 2019-05-06 MED ORDER — VITAMIN D (ERGOCALCIFEROL) 1.25 MG (50000 UNIT) PO CAPS: 50000.0000 [IU] | ORAL_CAPSULE | ORAL | 3 refills | Status: AC

## 2019-05-06 NOTE — Progress Notes (Signed)
Chief Complaint  Patient presents with  . Annual Exam    pap    HPI   "Rebecca Collier" presents using pronouns:  He, his.  He is a 44 year old transgender female.  Has never had breast exam or Pap.  Relationships are with females.  Rebecca Collier has a lot on his plate currently.  Has a lot of anxiety currently trying to handle.  This has kept him from being in large crowds and sleeping well.   In addition, has a history of eczema, untreated for sometime.  Current exacerbation noted with pruritis to upper extremities, neck, and face.  He has dealt with eczema for a long part of life.  No PCP recently to evaluate.  He and I spent a long time discussing anxiety in general and anxiety around healthcare including the breast exam and PAP today.  Reviewed BP which has been elevated despite use of Amlodipine.  In addition, discussed weight.  Recommended addressing through [facility] when patient feels up to it.  It is likely influencing his BP and overall fatigue.  Fatigue is increased with interrupted sleep.  Does not have energy for exercise after getting home from work.  Pandemic has contributed.      Problem List    Problem List: 2021-02: Well female exam with routine gynecological exam 2021-02: Class 3 severe obesity with body mass index (BMI) of 50.0 to  59.9 in adult [facility]) 2021-02: Psychophysiological insomnia 2021-02: GAD (generalized anxiety disorder) 2021-02: Chronic migraine without aura without status migrainosus,  not intractable 2014-12: Eczema 2014-12: Essential hypertension, benign 2014-12: Family history of colon cancer in mother (age 44)   Allergies   is allergic to penicillins.  Medications    Current Outpatient Medications:  .  albuterol (VENTOLIN HFA) 108 (90 Base) MCG/ACT inhaler, Inhale 2 puffs into the lungs every 4 (four) hours as needed for wheezing or shortness of breath., Disp: 18 g, Rfl: 2 .  BLACK CURRANT SEED OIL PO, Take by mouth daily., Disp: , Rfl:  .  [employer] ALLERGY  RELIEF 25 MG tablet, TAKE 1 TABLET (25 MG TOTAL) BY MOUTH EVERY 6 (SIX) HOURS AS NEEDED FOR ITCHING., Disp: 30 tablet, Rfl: 0 .  escitalopram (LEXAPRO) 10 MG tablet, Take 1 tablet (10 mg total) by mouth daily., Disp: 90 tablet, Rfl: 0 .  Ferrous Sulfate (IRON PO), Take by mouth daily., Disp: , Rfl:  .  hydrochlorothiazide (HYDRODIURIL) 25 MG tablet, Take 1 tablet (25 mg total) by mouth daily., Disp: 90 tablet, Rfl: 0 .  Magnesium Hydroxide (MAGNESIA PO), Take by mouth daily., Disp: , Rfl:  .  OVER THE COUNTER MEDICATION, daily., Disp: , Rfl:  .  OVER THE COUNTER MEDICATION, daily., Disp: , Rfl:  .  traZODone (DESYREL) 50 MG tablet, TAKE 1/2 TO 1 TABLET AT BEDTIME AS NEEDED FOR SLEEP, Disp: 90 tablet, Rfl: 2 .  triamcinolone cream (KENALOG) 0.1 %, APPLY TO AFFECTED AREA TWICE A DAY, Disp: 30 g, Rfl: 0 .  VITAMIN D PO, Take by mouth., Disp: , Rfl:  .  amLODipine (NORVASC) 5 MG tablet, Take 1 tablet (5 mg total) by mouth daily. (Patient not taking: Reported on 04/23/2019), Disp: 30 tablet, Rfl: 0 .  buPROPion (WELLBUTRIN XL) 150 MG 24 hr tablet, 1 tab q morning x 5 days, then increase to 2tabs q a.m., Disp: 60 tablet, Rfl: 3 .  Clobetasol Prop Emollient Base 0.05 % emollient cream, Apply to affected area bid, Disp: 60 g, Rfl: 2 .  fluocinolone (VANOS) 0.01 % cream, Apply topically 2 (two) times daily for 15 days., Disp: 60 g, Rfl: 3 .  hydrOXYzine (ATARAX/VISTARIL) 25 MG tablet, 1 to 2 tabs q 6 hours prn for itching, Disp: 30 tablet, Rfl: 0 .  ibuprofen (ADVIL) 200 MG tablet, Take 600 mg by mouth every 6 (six) hours as needed for moderate pain., Disp: , Rfl:  .  lisinopril-hydrochlorothiazide (ZESTORETIC) 20-25 MG tablet, Take 1 tablet by mouth daily., Disp: 90 tablet, Rfl: 3 .  metoCLOPramide (REGLAN) 10 MG tablet, Take 1 tablet (10 mg total) by mouth every 8 (eight) hours as needed for nausea (nausea/headache). (Patient not taking: Reported on 03/11/2019), Disp: 6 tablet, Rfl: 0 .  predniSONE  (DELTASONE) 20 MG tablet, 3 tabs x 2 days, 4 tabs x 4 days, 1 tab x 4 days, Disp: 26 tablet, Rfl: 0 .  QUEtiapine (SEROQUEL) 25 MG tablet, Take 1 tablet (25 mg total) by mouth at bedtime., Disp: 30 tablet, Rfl: 1   Review of Systems    Constitutional: Negative for activity change, appetite change, chills and fever.  HENT: Negative for congestion, nosebleeds, trouble swallowing and voice change.   Respiratory: Negative for cough, shortness of breath and wheezing.   Cardiac:  Negative for chest pain, pressure, syncope  Gastrointestinal: Negative for diarrhea, nausea and vomiting.  Genitourinary: Negative for difficulty urinating, dysuria, flank pain and hematuria.  Musculoskeletal: Negative for back pain, joint swelling and neck pain.  Neurological: Negative for dizziness, speech difficulty, light-headedness and numbness.  Psychological ROS: positive for - anxiety, depression and sleep disturbances negative for - concentration difficulties, obsessive thoughts or suicidal ideation  See HPI. All other review of systems negative.     Physical Exam:    height is 5' 6"  (1.676 m) and weight is 324 lb 9.6 oz (147.2 kg) (abnormal). Her temporal temperature is 98.2 F (36.8 C). Her blood pressure is 171/113 (abnormal) and her pulse is 80. Her oxygen saturation is 98%.   Physical Examination: General appearance - alert, well appearing, and in no distress and oriented to person, place, and time Mental status - normal mood, behavior, speech, dress, motor activity, and thought processes Eyes - PERRL. Extraocular movements intact.  No nystagmus.  Neck - supple, no significant adenopathy, carotids upstroke normal bilaterally, no bruits, thyroid exam: thyroid is normal in size without nodules or tenderness Chest - clear to auscultation, no wheezes, rales or rhonchi, symmetric air entry  Heart - normal rate, regular rhythm, normal S1, S2, no murmurs, rubs, clicks or gallops Extremities - dependent LE  edema without clubbing or cyanosis Skin - normal coloration and turgor, no rashes, no suspicious skin lesions noted  No hyperpigmentation of skin.  No current hematomas noted   Lab /Imaging Review    orders written for new lab studies as appropriate; see orders, no lab studies available for review at time of visit.   Assessment & Plan:  Rebecca Collier is a 44 y.o. female    1. Well female exam with routine gynecological exam   2. Essential hypertension, benign   3. Eczema, unspecified type   4. GAD (generalized anxiety disorder)   5. Psychophysiological insomnia   6. Class 3 severe obesity with body mass index (BMI) of 50.0 to 59.9 in adult, unspecified obesity type, unspecified whether serious comorbidity present ([place])   7. Chronic migraine without aura without status migrainosus, not intractable    Orders Placed This Encounter  Procedures  . MM Digital Screening  . VITAMIN D  25 Hydroxy (Vit-D Deficiency, Fractures)  . B12 and Folate Panel  . CBC with Differential/Platelet  . Iron, TIBC and Ferritin Panel   Meds ordered this encounter  Medications  . lisinopril-hydrochlorothiazide (ZESTORETIC) 20-25 MG tablet    Sig: Take 1 tablet by mouth daily.    Dispense:  90 tablet    Refill:  3  . methylPREDNISolone acetate (DEPO-MEDROL) injection 80 mg  . QUEtiapine (SEROQUEL) 25 MG tablet    Sig: Take 1 tablet (25 mg total) by mouth at bedtime.    Dispense:  30 tablet    Refill:  1  . predniSONE (DELTASONE) 20 MG tablet    Sig: 3 tabs x 2 days, 4 tabs x 4 days, 1 tab x 4 days    Dispense:  26 tablet    Refill:  0  . hydrOXYzine (ATARAX/VISTARIL) 25 MG tablet    Sig: 1 to 2 tabs q 6 hours prn for itching    Dispense:  30 tablet    Refill:  0  . buPROPion (WELLBUTRIN XL) 150 MG 24 hr tablet    Sig: 1 tab q morning x 5 days, then increase to 2tabs q a.m.    Dispense:  60 tablet    Refill:  3  . fluocinolone (VANOS) 0.01 % cream    Sig: Apply topically 2 (two) times daily for  15 days.    Dispense:  60 g    Refill:  3  . Clobetasol Prop Emollient Base 0.05 % emollient cream    Sig: Apply to affected area bid    Dispense:  60 g    Refill:  2   Will f/u in 1 month to review new meds.  He is  inline with this plan.     Glyn Ade, NP

## 2019-05-19 ENCOUNTER — Telehealth: Payer: Self-pay | Admitting: Adult Health Nurse Practitioner

## 2019-05-19 NOTE — Telephone Encounter (Signed)
Pt called per pt the doctor told her that if she got any of the side effects like a dry cough with her anxiety medication to let provider know and pt states she stopped taking the medication. Would like a nurse to giver her a call [phone] Please advise.

## 2019-05-19 NOTE — Telephone Encounter (Signed)
Patient states you told her to call back if she had any side effects from her anxiety medication , and she now has a dry cough and stopped taking the medication.   Please Advise.

## 2019-05-21 ENCOUNTER — Other Ambulatory Visit: Payer: Self-pay

## 2019-05-21 ENCOUNTER — Ambulatory Visit (INDEPENDENT_AMBULATORY_CARE_PROVIDER_SITE_OTHER): Payer: 59 | Admitting: Adult Health Nurse Practitioner

## 2019-05-21 ENCOUNTER — Encounter: Payer: Self-pay | Admitting: Adult Health Nurse Practitioner

## 2019-05-21 VITALS — BP 145/89 | HR 78 | Temp 98.0°F | Ht 66.0 in | Wt 321.4 lb

## 2019-05-21 DIAGNOSIS — L2084 Intrinsic (allergic) eczema: Secondary | ICD-10-CM | POA: Diagnosis not present

## 2019-05-21 DIAGNOSIS — R739 Hyperglycemia, unspecified: Secondary | ICD-10-CM | POA: Diagnosis not present

## 2019-05-21 DIAGNOSIS — R69 Illness, unspecified: Secondary | ICD-10-CM | POA: Diagnosis not present

## 2019-05-21 DIAGNOSIS — E559 Vitamin D deficiency, unspecified: Secondary | ICD-10-CM | POA: Diagnosis not present

## 2019-05-21 DIAGNOSIS — F411 Generalized anxiety disorder: Secondary | ICD-10-CM

## 2019-05-21 DIAGNOSIS — I10 Essential (primary) hypertension: Secondary | ICD-10-CM

## 2019-05-21 LAB — GLUCOSE, POCT (MANUAL RESULT ENTRY): POC Glucose: 96 mg/dl (ref 70–99)

## 2019-05-21 MED ORDER — HYDROCHLOROTHIAZIDE 25 MG PO TABS: 25.0000 mg | ORAL_TABLET | Freq: Every day | ORAL | 3 refills | Status: DC

## 2019-05-21 MED ORDER — AMLODIPINE BESYLATE 10 MG PO TABS: 10.0000 mg | ORAL_TABLET | Freq: Every day | ORAL | 2 refills | Status: DC

## 2019-05-21 MED ORDER — ALBUTEROL SULFATE HFA 108 (90 BASE) MCG/ACT IN AERS: 2.0000 | INHALATION_SPRAY | RESPIRATORY_TRACT | 2 refills | Status: DC | PRN

## 2019-05-21 MED ORDER — METHYLPREDNISOLONE 4 MG PO TBPK: ORAL_TABLET | ORAL | 0 refills | Status: DC

## 2019-05-21 MED ORDER — BUPROPION HCL ER (XL) 300 MG PO TB24: 300.0000 mg | ORAL_TABLET | Freq: Every day | ORAL | 3 refills | Status: DC

## 2019-05-21 NOTE — Progress Notes (Signed)
Chief Complaint  Patient presents with  . Follow-up    Hypertension    HPI  "T"  Presents for f/u of his Hypertension, anxiety, and eczema.   He has not had a call from Dermatology for an appointment so I will place the referral again.   Patient had developed a dry cough from his blood pressure medication.  However, his thought the cough was coming from bupropion and stopped that continues to be on the hydrochlorothiazide lisinopril.  We discussed the medications in detail.  Will DC hydrochlorothiazide lisinopril.  Add hydrochlorothiazide and amlodipine.  In addition, he had stopped the Buproprion, but would like to go back on it.  He is doing better from an anxiety standpoint.  His girlfriend will be moving it this way and about 2 months.  He is feeling somewhat overwhelmed but happy.  Dry cough remains and patient was given a note for work today given the degree of cough.  I sent in a Medrol Dosepak for relief.  GAD 7 : Generalized Anxiety Score 05/21/2019 04/23/2019  Nervous, Anxious, on Edge 0 0  Control/stop worrying 0 0  Worry too much - different things 0 0  Trouble relaxing - 0  Restless 0 0  Easily annoyed or irritable 0 0  Afraid - awful might happen 0 0  Total GAD 7 Score - 0  Anxiety Difficulty Not difficult at all Not difficult at all    [facility] SCORE ONLY 05/21/2019 04/23/2019 03/11/2019  Score 0 0 0      Problem List    Problem List: 2021-03: Vitamin D deficiency 2021-02: Well female exam with routine gynecological exam 2021-02: Class 3 severe obesity with body mass index (BMI) of 50.0 to  59.9 in adult [facility]) 2021-02: Psychophysiological insomnia 2021-02: GAD (generalized anxiety disorder) 2021-02: Chronic migraine without aura without status migrainosus,  not intractable 2014-12: Eczema 2014-12: Essential hypertension, benign 2014-12: Family history of colon cancer in mother (age 32)   Allergies   is allergic to penicillins.  Medications    Current  Outpatient Medications:  .  albuterol (VENTOLIN HFA) 108 (90 Base) MCG/ACT inhaler, Inhale 2 puffs into the lungs every 4 (four) hours as needed for wheezing or shortness of breath., Disp: 18 g, Rfl: 2 .  amLODipine (NORVASC) 5 MG tablet, Take 1 tablet (5 mg total) by mouth daily., Disp: 30 tablet, Rfl: 0 .  BLACK CURRANT SEED OIL PO, Take by mouth daily., Disp: , Rfl:  .  Clobetasol Prop Emollient Base 0.05 % emollient cream, Apply to affected area bid, Disp: 60 g, Rfl: 2 .  escitalopram (LEXAPRO) 10 MG tablet, Take 1 tablet (10 mg total) by mouth daily., Disp: 90 tablet, Rfl: 0 .  Ferrous Sulfate (IRON PO), Take by mouth daily., Disp: , Rfl:  .  hydrochlorothiazide (HYDRODIURIL) 25 MG tablet, Take 1 tablet (25 mg total) by mouth daily., Disp: 90 tablet, Rfl: 0 .  hydrOXYzine (ATARAX/VISTARIL) 25 MG tablet, 1 to 2 tabs q 6 hours prn for itching, Disp: 30 tablet, Rfl: 0 .  ibuprofen (ADVIL) 200 MG tablet, Take 600 mg by mouth every 6 (six) hours as needed for moderate pain., Disp: , Rfl:  .  lisinopril-hydrochlorothiazide (ZESTORETIC) 20-25 MG tablet, Take 1 tablet by mouth daily., Disp: 90 tablet, Rfl: 3 .  Magnesium Hydroxide (MAGNESIA PO), Take by mouth daily., Disp: , Rfl:  .  OVER THE COUNTER MEDICATION, daily., Disp: , Rfl:  .  OVER THE COUNTER MEDICATION, daily., Disp: , Rfl:  .  QUEtiapine (SEROQUEL) 25 MG tablet, Take 1 tablet (25 mg total) by mouth at bedtime., Disp: 30 tablet, Rfl: 1 .  traZODone (DESYREL) 50 MG tablet, TAKE 1/2 TO 1 TABLET AT BEDTIME AS NEEDED FOR SLEEP, Disp: 90 tablet, Rfl: 2 .  VITAMIN D PO, Take by mouth., Disp: , Rfl:  .  Vitamin D, Ergocalciferol, (DRISDOL) 1.25 MG (50000 UNIT) CAPS capsule, Take 1 capsule (50,000 Units total) by mouth every 7 (seven) days., Disp: 5 capsule, Rfl: 3 .  buPROPion (WELLBUTRIN XL) 150 MG 24 hr tablet, 1 tab q morning x 5 days, then increase to 2tabs q a.m. (Patient not taking: Reported on 05/21/2019), Disp: 60 tablet, Rfl: 3 .  [employer]  ALLERGY RELIEF 25 MG tablet, TAKE 1 TABLET (25 MG TOTAL) BY MOUTH EVERY 6 (SIX) HOURS AS NEEDED FOR ITCHING., Disp: 30 tablet, Rfl: 0 .  metoCLOPramide (REGLAN) 10 MG tablet, Take 1 tablet (10 mg total) by mouth every 8 (eight) hours as needed for nausea (nausea/headache). (Patient not taking: Reported on 03/11/2019), Disp: 6 tablet, Rfl: 0 .  predniSONE (DELTASONE) 20 MG tablet, 3 tabs x 2 days, 4 tabs x 4 days, 1 tab x 4 days, Disp: 26 tablet, Rfl: 0 .  triamcinolone cream (KENALOG) 0.1 %, APPLY TO AFFECTED AREA TWICE A DAY, Disp: 30 g, Rfl: 0   Review of Systems    Constitutional: Negative for activity change, appetite change, chills and fever.  HENT: Negative for congestion, nosebleeds, trouble swallowing and voice change.   Respiratory: Negative for  shortness of breath and wheezing.  Positive for dry cough.  Cardiac:  Negative for chest pain, pressure, syncope  Gastrointestinal: Negative for diarrhea, nausea and vomiting.  Genitourinary: Negative for difficulty urinating, dysuria, flank pain and hematuria.  Musculoskeletal: Negative for back pain, joint swelling and neck pain.  Neurological: Negative for dizziness, speech difficulty, light-headedness and numbness.  See HPI. All other review of systems negative.    ++++++++++++++++++++++++++++++++++++ Physical Exam:    height is 5' 6"  (1.676 m) and weight is 321 lb 6.4 oz (145.8 kg) (abnormal). Her temporal temperature is 98 F (36.7 C). Her blood pressure is 145/89 (abnormal) and her pulse is 78. Her oxygen saturation is 99%.   Physical Examination: General appearance - alert, well appearing, and in no distress and oriented to person, place, and time Mental status - normal mood, behavior, speech, dress, motor activity, and thought processes Eyes - PERRL. Extraocular movements intact.  No nystagmus.  Neck - supple, no significant adenopathy, carotids upstroke normal bilaterally, no bruits, thyroid exam: thyroid is normal in size without  nodules or tenderness Chest - clear to auscultation, no wheezes, rales or rhonchi, symmetric air entry  Heart - normal rate, regular rhythm, normal S1, S2, no murmurs, rubs, clicks or gallops Extremities - dependent LE edema without clubbing or cyanosis Skin - normal coloration and turgor, no rashes, no suspicious skin lesions noted  No hyperpigmentation of skin.  No current hematomas noted   Lab /Imaging Review    Labs reviewed.  Patient had already started taking her Vitamin D. .   Assessment & Plan:  Rebecca Collier is a 44 y.o. female   1. Elevated blood sugar   2. Essential hypertension, benign   3. Vitamin D deficiency   4. GAD (generalized anxiety disorder)    Orders Placed This Encounter  Procedures  . POCT glucose (manual entry)   Meds ordered this encounter  Medications  . methylPREDNISolone (MEDROL DOSEPAK) 4 MG TBPK tablet  Sig: As directed on pkg label    Dispense:  21 each    Refill:  0  . buPROPion (WELLBUTRIN XL) 300 MG 24 hr tablet    Sig: Take 1 tablet (300 mg total) by mouth daily.    Dispense:  30 tablet    Refill:  3  . hydrochlorothiazide (HYDRODIURIL) 25 MG tablet    Sig: Take 1 tablet (25 mg total) by mouth daily.    Dispense:  30 tablet    Refill:  3  . amLODipine (NORVASC) 10 MG tablet    Sig: Take 1 tablet (10 mg total) by mouth daily.    Dispense:  30 tablet    Refill:  2  . albuterol (VENTOLIN HFA) 108 (90 Base) MCG/ACT inhaler    Sig: Inhale 2 puffs into the lungs every 4 (four) hours as needed for wheezing or shortness of breath.    Dispense:  18 g    Refill:  2   Will f'/u in 1 month due to BP issue and change in medication.  Referral to Dermatology.   Glyn Ade, NP

## 2019-05-21 NOTE — Patient Instructions (Addendum)
Managing Your Hypertension Hypertension is commonly called high blood pressure. This is when the force of your blood pressing against the walls of your arteries is too strong. Arteries are blood vessels that carry blood from your heart throughout your body. Hypertension forces the heart to work harder to pump blood, and may cause the arteries to become narrow or stiff. Having untreated or uncontrolled hypertension can cause heart attack, stroke, kidney disease, and other problems. What are blood pressure readings? A blood pressure reading consists of a higher number over a lower number. Ideally, your blood pressure should be below 120/80. The first ("top") number is called the systolic pressure. It is a measure of the pressure in your arteries as your heart beats. The second ("bottom") number is called the diastolic pressure. It is a measure of the pressure in your arteries as the heart relaxes. What does my blood pressure reading mean? Blood pressure is classified into four stages. Based on your blood pressure reading, your health care provider may use the following stages to determine what type of treatment you need, if any. Systolic pressure and diastolic pressure are measured in a unit called mm Hg. Normal  Systolic pressure: below 016.  Diastolic pressure: below 80. Elevated  Systolic pressure: 010-932.  Diastolic pressure: below 80. Hypertension stage 1  Systolic pressure: 355-732.  Diastolic pressure: 20-25. Hypertension stage 2  Systolic pressure: 427 or above.  Diastolic pressure: 90 or above. What health risks are associated with hypertension? Managing your hypertension is an important responsibility. Uncontrolled hypertension can lead to:  A heart attack.  A stroke.  A weakened blood vessel (aneurysm).  Heart failure.  Kidney damage.  Eye damage.  Metabolic syndrome.  Memory and concentration problems. What changes can I make to manage my  hypertension? Hypertension can be managed by making lifestyle changes and possibly by taking medicines. Your health care provider will help you make a plan to bring your blood pressure within a normal range. Eating and drinking   Eat a diet that is high in fiber and potassium, and low in salt (sodium), added sugar, and fat. An example eating plan is called the DASH (Dietary Approaches to Stop Hypertension) diet. To eat this way: ? Eat plenty of fresh fruits and vegetables. Try to fill half of your plate at each meal with fruits and vegetables. ? Eat whole grains, such as whole wheat pasta, brown rice, or whole grain bread. Fill about one quarter of your plate with whole grains. ? Eat low-fat diary products. ? Avoid fatty cuts of meat, processed or cured meats, and poultry with skin. Fill about one quarter of your plate with lean proteins such as fish, chicken without skin, beans, eggs, and tofu. ? Avoid premade and processed foods. These tend to be higher in sodium, added sugar, and fat.  Reduce your daily sodium intake. Most people with hypertension should eat less than 1,500 mg of sodium a day.  Limit alcohol intake to no more than 1 drink a day for nonpregnant women and 2 drinks a day for men. One drink equals 12 oz of beer, 5 oz of wine, or 1 oz of hard liquor. Lifestyle  Work with your health care provider to maintain a healthy body weight, or to lose weight. Ask what an ideal weight is for you.  Get at least 30 minutes of exercise that causes your heart to beat faster (aerobic exercise) most days of the week. Activities may include walking, swimming, or biking.  Include exercise to strengthen your muscles (resistance exercise), such as weight lifting, as part of your weekly exercise routine. Try to do these types of exercises for 30 minutes at least 3 days a week.  Do not use any products that contain nicotine or tobacco, such as cigarettes and e-cigarettes. If you need help quitting,  ask your health care provider.  Control any long-term (chronic) conditions you have, such as high cholesterol or diabetes. Monitoring  Monitor your blood pressure at home as told by your health care provider. Your personal target blood pressure may vary depending on your medical conditions, your age, and other factors.  Have your blood pressure checked regularly, as often as told by your health care provider. Working with your health care provider  Review all the medicines you take with your health care provider because there may be side effects or interactions.  Talk with your health care provider about your diet, exercise habits, and other lifestyle factors that may be contributing to hypertension.  Visit your health care provider regularly. Your health care provider can help you create and adjust your plan for managing hypertension. Will I need medicine to control my blood pressure? Your health care provider may prescribe medicine if lifestyle changes are not enough to get your blood pressure under control, and if:  Your systolic blood pressure is 130 or higher.  Your diastolic blood pressure is 80 or higher. Take medicines only as told by your health care provider. Follow the directions carefully. Blood pressure medicines must be taken as prescribed. The medicine does not work as well when you skip doses. Skipping doses also puts you at risk for problems. Contact a health care provider if:  You think you are having a reaction to medicines you have taken.  You have repeated (recurrent) headaches.  You feel dizzy.  You have swelling in your ankles.  You have trouble with your vision. Get help right away if:  You develop a severe headache or confusion.  You have unusual weakness or numbness, or you feel faint.  You have severe pain in your chest or abdomen.  You vomit repeatedly.  You have trouble breathing. Summary  Hypertension is when the force of blood pumping  through your arteries is too strong. If this condition is not controlled, it may put you at risk for serious complications.  Your personal target blood pressure may vary depending on your medical conditions, your age, and other factors. For most people, a normal blood pressure is less than 120/80.  Hypertension is managed by lifestyle changes, medicines, or both. Lifestyle changes include weight loss, eating a healthy, low-sodium diet, exercising more, and limiting alcohol. This information is not intended to replace advice given to you by your health care provider. Make sure you discuss any questions you have with your health care provider. Document Revised: 06/07/2018 Document Reviewed: 01/12/2016 Elsevier Patient Education  [place].

## 2019-06-02 ENCOUNTER — Other Ambulatory Visit: Payer: Self-pay | Admitting: Registered Nurse

## 2019-06-02 DIAGNOSIS — F411 Generalized anxiety disorder: Secondary | ICD-10-CM

## 2019-06-03 ENCOUNTER — Telehealth: Payer: Self-pay

## 2019-06-03 NOTE — Telephone Encounter (Signed)
Patient is requesting a refill of the following medications: Requested Prescriptions   Pending Prescriptions Disp Refills  . escitalopram (LEXAPRO) 10 MG tablet [Pharmacy Med Name: ESCITALOPRAM 10 MG TABLET] 90 tablet 0    Sig: TAKE 1 TABLET BY MOUTH EVERY DAY    Date of patient request: 06/03/2019 Last office visit: 05/21/2019 Date of last refill: 03/11/2019 Last refill amount: 90 Tablets Follow up time period per chart: 06/18/2019

## 2019-06-03 NOTE — Telephone Encounter (Signed)
Sent to provider to approve

## 2019-06-03 NOTE — Telephone Encounter (Signed)
Patient is requesting a refill of the following medications: Requested Prescriptions    No prescriptions requested or ordered in this encounter  Lexapro 10 mg  Date of patient request: 06/03/2019 Last office visit: 05/21/2019 Date of last refill: 03/11/2019 Last refill amount: 90

## 2019-06-11 ENCOUNTER — Other Ambulatory Visit: Payer: Self-pay | Admitting: Registered Nurse

## 2019-06-11 DIAGNOSIS — R21 Rash and other nonspecific skin eruption: Secondary | ICD-10-CM

## 2019-06-11 NOTE — Telephone Encounter (Signed)
Patient is requesting a refill of the following medications: Requested Prescriptions   Pending Prescriptions Disp Refills  . predniSONE (DELTASONE) 20 MG tablet [Pharmacy Med Name: PREDNISONE 20 MG TABLET] 8 tablet     Sig: TAKE 2 TABLETS (40 MG TOTAL) BY MOUTH DAILY WITH BREAKFAST.    Date of patient request: 06/11/2019 Last office visit: 05/21/2019 Date of last refill: 04/23/2019 Last refill amount: 26 tablets  Follow up time period per chart: 06/18/2019

## 2019-06-18 ENCOUNTER — Ambulatory Visit: Payer: 59 | Admitting: Adult Health Nurse Practitioner

## 2019-06-24 ENCOUNTER — Ambulatory Visit (INDEPENDENT_AMBULATORY_CARE_PROVIDER_SITE_OTHER): Payer: 59 | Admitting: Adult Health Nurse Practitioner

## 2019-06-24 ENCOUNTER — Other Ambulatory Visit: Payer: Self-pay

## 2019-06-24 ENCOUNTER — Encounter: Payer: Self-pay | Admitting: Adult Health Nurse Practitioner

## 2019-06-24 VITALS — BP 144/99 | HR 86 | Temp 98.1°F | Ht 66.0 in | Wt 319.4 lb

## 2019-06-24 DIAGNOSIS — E559 Vitamin D deficiency, unspecified: Secondary | ICD-10-CM | POA: Diagnosis not present

## 2019-06-24 DIAGNOSIS — R69 Illness, unspecified: Secondary | ICD-10-CM | POA: Diagnosis not present

## 2019-06-24 DIAGNOSIS — I10 Essential (primary) hypertension: Secondary | ICD-10-CM

## 2019-06-24 DIAGNOSIS — R739 Hyperglycemia, unspecified: Secondary | ICD-10-CM

## 2019-06-24 DIAGNOSIS — F411 Generalized anxiety disorder: Secondary | ICD-10-CM

## 2019-06-24 DIAGNOSIS — L309 Dermatitis, unspecified: Secondary | ICD-10-CM

## 2019-06-24 DIAGNOSIS — R7303 Prediabetes: Secondary | ICD-10-CM | POA: Diagnosis not present

## 2019-06-24 LAB — POCT URINALYSIS DIP (MANUAL ENTRY)
Bilirubin, UA: NEGATIVE
Blood, UA: NEGATIVE
Glucose, UA: NEGATIVE mg/dL
Ketones, POC UA: NEGATIVE mg/dL
Leukocytes, UA: NEGATIVE
Nitrite, UA: NEGATIVE
Protein Ur, POC: 30 mg/dL — AB
Spec Grav, UA: >=1.03 — AB (ref 1.010–1.025)
Urobilinogen, UA: 0.2 E.U./dL
pH, UA: 5 (ref 5.0–8.0)

## 2019-06-24 LAB — GLUCOSE, POCT (MANUAL RESULT ENTRY): POC Glucose: 137 mg/dl — AB (ref 70–99)

## 2019-06-24 MED ORDER — TRIAMCINOLONE ACETONIDE 40 MG/ML IJ SUSP
40.0000 mg | Freq: Once | INTRAMUSCULAR | Status: AC
  Administered 2019-06-24: 40 mg via INTRAMUSCULAR

## 2019-06-24 MED ORDER — METFORMIN HCL ER 500 MG PO TB24: 500.0000 mg | ORAL_TABLET | Freq: Every day | ORAL | 3 refills | Status: DC

## 2019-06-24 MED ORDER — PREDNISONE 20 MG PO TABS: ORAL_TABLET | ORAL | 0 refills | Status: DC

## 2019-06-24 NOTE — Patient Instructions (Addendum)
DASH Eating Plan DASH stands for "Dietary Approaches to Stop Hypertension." The DASH eating plan is a healthy eating plan that has been shown to reduce high blood pressure (hypertension). It may also reduce your risk for type 2 diabetes, heart disease, and stroke. The DASH eating plan may also help with weight loss. What are tips for following this plan?  General guidelines  Avoid eating more than 2,300 mg (milligrams) of salt (sodium) a day. If you have hypertension, you may need to reduce your sodium intake to 1,500 mg a day.  Limit alcohol intake to no more than 1 drink a day for nonpregnant women and 2 drinks a day for men. One drink equals 12 oz of beer, 5 oz of wine, or 1 oz of hard liquor.  Work with your health care provider to maintain a healthy body weight or to lose weight. Ask what an ideal weight is for you.  Get at least 30 minutes of exercise that causes your heart to beat faster (aerobic exercise) most days of the week. Activities may include walking, swimming, or biking.  Work with your health care provider or diet and nutrition specialist (dietitian) to adjust your eating plan to your individual calorie needs. Reading food labels   Check food labels for the amount of sodium per serving. Choose foods with less than 5 percent of the Daily Value of sodium. Generally, foods with less than 300 mg of sodium per serving fit into this eating plan.  To find whole grains, look for the word "whole" as the first word in the ingredient list. Shopping  Buy products labeled as "low-sodium" or "no salt added."  Buy fresh foods. Avoid canned foods and premade or frozen meals. Cooking  Avoid adding salt when cooking. Use salt-free seasonings or herbs instead of table salt or sea salt. Check with your health care provider or pharmacist before using salt substitutes.  Do not fry foods. Cook foods using healthy methods such as baking, boiling, grilling, and broiling instead.  Cook with  heart-healthy oils, such as olive, canola, soybean, or sunflower oil. Meal planning  Eat a balanced diet that includes: ? 5 or more servings of fruits and vegetables each day. At each meal, try to fill half of your plate with fruits and vegetables. ? Up to 6-8 servings of whole grains each day. ? Less than 6 oz of lean meat, poultry, or fish each day. A 3-oz serving of meat is about the same size as a deck of cards. One egg equals 1 oz. ? 2 servings of low-fat dairy each day. ? A serving of nuts, seeds, or beans 5 times each week. ? Heart-healthy fats. Healthy fats called Omega-3 fatty acids are found in foods such as flaxseeds and coldwater fish, like sardines, salmon, and mackerel.  Limit how much you eat of the following: ? Canned or prepackaged foods. ? Food that is high in trans fat, such as fried foods. ? Food that is high in saturated fat, such as fatty meat. ? Sweets, desserts, sugary drinks, and other foods with added sugar. ? Full-fat dairy products.  Do not salt foods before eating.  Try to eat at least 2 vegetarian meals each week.  Eat more home-cooked food and less restaurant, buffet, and fast food.  When eating at a restaurant, ask that your food be prepared with less salt or no salt, if possible. What foods are recommended? The items listed may not be a complete list. Talk with your dietitian about   what dietary choices are best for you. Grains Whole-grain or whole-wheat bread. Whole-grain or whole-wheat pasta. Brown rice. Oatmeal. Quinoa. Bulgur. Whole-grain and low-sodium cereals. Pita bread. Low-fat, low-sodium crackers. Whole-wheat flour tortillas. Vegetables Fresh or frozen vegetables (raw, steamed, roasted, or grilled). Low-sodium or reduced-sodium tomato and vegetable juice. Low-sodium or reduced-sodium tomato sauce and tomato paste. Low-sodium or reduced-sodium canned vegetables. Fruits All fresh, dried, or frozen fruit. Canned fruit in natural juice (without  added sugar). Meat and other protein foods Skinless chicken or turkey. Ground chicken or turkey. Pork with fat trimmed off. Fish and seafood. Egg whites. Dried beans, peas, or lentils. Unsalted nuts, nut butters, and seeds. Unsalted canned beans. Lean cuts of beef with fat trimmed off. Low-sodium, lean deli meat. Dairy Low-fat (1%) or fat-free (skim) milk. Fat-free, low-fat, or reduced-fat cheeses. Nonfat, low-sodium ricotta or cottage cheese. Low-fat or nonfat yogurt. Low-fat, low-sodium cheese. Fats and oils Soft margarine without trans fats. Vegetable oil. Low-fat, reduced-fat, or light mayonnaise and salad dressings (reduced-sodium). Canola, safflower, olive, soybean, and sunflower oils. Avocado. Seasoning and other foods Herbs. Spices. Seasoning mixes without salt. Unsalted popcorn and pretzels. Fat-free sweets. What foods are not recommended? The items listed may not be a complete list. Talk with your dietitian about what dietary choices are best for you. Grains Baked goods made with fat, such as croissants, muffins, or some breads. Dry pasta or rice meal packs. Vegetables Creamed or fried vegetables. Vegetables in a cheese sauce. Regular canned vegetables (not low-sodium or reduced-sodium). Regular canned tomato sauce and paste (not low-sodium or reduced-sodium). Regular tomato and vegetable juice (not low-sodium or reduced-sodium). Pickles. Olives. Fruits Canned fruit in a light or heavy syrup. Fried fruit. Fruit in cream or butter sauce. Meat and other protein foods Fatty cuts of meat. Ribs. Fried meat. Bacon. Sausage. Bologna and other processed lunch meats. Salami. Fatback. Hotdogs. Bratwurst. Salted nuts and seeds. Canned beans with added salt. Canned or smoked fish. Whole eggs or egg yolks. Chicken or turkey with skin. Dairy Whole or 2% milk, cream, and half-and-half. Whole or full-fat cream cheese. Whole-fat or sweetened yogurt. Full-fat cheese. Nondairy creamers. Whipped toppings.  Processed cheese and cheese spreads. Fats and oils Butter. Stick margarine. Lard. Shortening. Ghee. Bacon fat. Tropical oils, such as coconut, palm kernel, or palm oil. Seasoning and other foods Salted popcorn and pretzels. Onion salt, garlic salt, seasoned salt, table salt, and sea salt. Worcestershire sauce. Tartar sauce. Barbecue sauce. Teriyaki sauce. Soy sauce, including reduced-sodium. Steak sauce. Canned and packaged gravies. Fish sauce. Oyster sauce. Cocktail sauce. Horseradish that you find on the shelf. Ketchup. Mustard. Meat flavorings and tenderizers. Bouillon cubes. Hot sauce and Tabasco sauce. Premade or packaged marinades. Premade or packaged taco seasonings. Relishes. Regular salad dressings. Where to find more information:  National Heart, Lung, and Blood Institute: www.nhlbi.nih.gov  American Heart Association: www.heart.org Summary  The DASH eating plan is a healthy eating plan that has been shown to reduce high blood pressure (hypertension). It may also reduce your risk for type 2 diabetes, heart disease, and stroke.  With the DASH eating plan, you should limit salt (sodium) intake to 2,300 mg a day. If you have hypertension, you may need to reduce your sodium intake to 1,500 mg a day.  When on the DASH eating plan, aim to eat more fresh fruits and vegetables, whole grains, lean proteins, low-fat dairy, and heart-healthy fats.  Work with your health care provider or diet and nutrition specialist (dietitian) to adjust your eating plan to your   individual calorie needs. This information is not intended to replace advice given to you by your health care provider. Make sure you discuss any questions you have with your health care provider. Document Revised: 01/26/2017 Document Reviewed: 02/07/2016 Elsevier Patient Education  2020 Elsevier Inc.  

## 2019-06-24 NOTE — Addendum Note (Signed)
Addended by: Meredeth Ide on: 06/24/2019 11:56 AM   Modules accepted: Orders

## 2019-06-24 NOTE — Progress Notes (Signed)
06/24/2019  Rebecca Collier Mar 04, 1975 [id number]   PRONOUNS:  He, his   History   Chief Complaint  Patient presents with  . Follow-up    HPI   Rebecca Collier  Is following up on his medical conditions.  He  has gotten his Covid Vaccine.  Lost some weight by incorporating some walking before he  clocks in at work.  His girlfriend is moving out in June and he has already found a place to move into--looking forward to arrival.  Minimizing Na to food.  If eating out, chooses [employer].   Exacerbation of exzema today primarily through upper extremities, extremely pruritic.  Reviewed steroid treatment in past as well as elevated BS.  Discussed adding Metformin given elevated BS and steroid use a couple of times a year.  Reviewed all side effects:  GI.  Will start with an extended release Metformin and he is amenable to this.  Requests IM shot and oral tx.  They are moisturizing and being diligent about hydration given eczema.    All in all improving.  BP elevated today but patient will bring cuff next time to titrate and will monitor.    Past Medical History:  Diagnosis Date  . Allergy   . Anxiety   . Eczema   . Hypertension   . Obesity   . Sciatica    spasms daily in left leg    History reviewed. No pertinent surgical history. Family History  Problem Relation Age of Onset  . Colon cancer Mother        dx at age  . Hypertension Mother   . Cancer Mother        colon  . Healthy Father   . Healthy Brother    Social History   Tobacco Use  . Smoking status: Never Smoker  . Smokeless tobacco: Never Used  Substance Use Topics  . Alcohol use: Yes    Comment: socially  . Drug use: No   OB History   No obstetric history on file.    Review of Systems   Review of Systems See HPI Constitution: No fevers or chills No malaise No diaphoresis Skin:Positive for pruritis, eczema, skin sloughing  Eyes: no blurry vision, no double vision GU: no dysuria or hematuria Neuro: no  dizziness or headaches   Allergies   Penicillins  Home Medications    Current Outpatient Medications:  .  albuterol (VENTOLIN HFA) 108 (90 Base) MCG/ACT inhaler, Inhale 2 puffs into the lungs every 4 (four) hours as needed for wheezing or shortness of breath., Disp: 18 g, Rfl: 2 .  BLACK CURRANT SEED OIL PO, Take by mouth daily., Disp: , Rfl:  .  Clobetasol Prop Emollient Base 0.05 % emollient cream, Apply to affected area bid, Disp: 60 g, Rfl: 2 .  escitalopram (LEXAPRO) 10 MG tablet, TAKE 1 TABLET BY MOUTH EVERY DAY, Disp: 90 tablet, Rfl: 0 .  Ferrous Sulfate (IRON PO), Take by mouth daily., Disp: , Rfl:  .  hydrOXYzine (ATARAX/VISTARIL) 25 MG tablet, 1 to 2 tabs q 6 hours prn for itching, Disp: 30 tablet, Rfl: 0 .  ibuprofen (ADVIL) 200 MG tablet, Take 600 mg by mouth every 6 (six) hours as needed for moderate pain., Disp: , Rfl:  .  lisinopril-hydrochlorothiazide (ZESTORETIC) 20-25 MG tablet, Take 1 tablet by mouth daily., Disp: 90 tablet, Rfl: 3 .  Magnesium Hydroxide (MAGNESIA PO), Take by mouth daily., Disp: , Rfl:  .  methylPREDNISolone (MEDROL DOSEPAK) 4 MG TBPK tablet,  As directed on pkg label, Disp: 21 each, Rfl: 0 .  OVER THE COUNTER MEDICATION, daily., Disp: , Rfl:  .  OVER THE COUNTER MEDICATION, daily., Disp: , Rfl:  .  QUEtiapine (SEROQUEL) 25 MG tablet, Take 1 tablet (25 mg total) by mouth at bedtime., Disp: 30 tablet, Rfl: 1 .  traZODone (DESYREL) 50 MG tablet, TAKE 1/2 TO 1 TABLET AT BEDTIME AS NEEDED FOR SLEEP, Disp: 90 tablet, Rfl: 2 .  VITAMIN D PO, Take by mouth., Disp: , Rfl:  .  amLODipine (NORVASC) 10 MG tablet, Take 1 tablet (10 mg total) by mouth daily., Disp: 30 tablet, Rfl: 2 .  buPROPion (WELLBUTRIN XL) 300 MG 24 hr tablet, Take 1 tablet (300 mg total) by mouth daily., Disp: 30 tablet, Rfl: 3 .  hydrochlorothiazide (HYDRODIURIL) 25 MG tablet, Take 1 tablet (25 mg total) by mouth daily., Disp: 30 tablet, Rfl: 3 .  metFORMIN (GLUCOPHAGE XR) 500 MG 24 hr  tablet, Take 1 tablet (500 mg total) by mouth daily with breakfast., Disp: 30 tablet, Rfl: 3 .  predniSONE (DELTASONE) 20 MG tablet, 56m x 2 days, 463mx 4 days, 2049m 4 days, Disp: 18 tablet, Rfl: 0 .  triamcinolone cream (KENALOG) 0.1 %, APPLY TO AFFECTED AREA TWICE A DAY, Disp: 30 g, Rfl: 0  Current Facility-Administered Medications:  .  triamcinolone acetonide (KENALOG-40) injection 40 mg, 40 mg, Intramuscular, Once, Byr[place]ton Clinton[facility]ore[facility] [employer]  Meds Ordered and Administered this Visit   Meds ordered this encounter  Medications  . triamcinolone acetonide (KENALOG-40) injection 40 mg  . metFORMIN (GLUCOPHAGE XR) 500 MG 24 hr tablet    Sig: Take 1 tablet (500 mg total) by mouth daily with breakfast.    Dispense:  30 tablet    Refill:  3  . predniSONE (DELTASONE) 20 MG tablet    Sig: 53m38m2 days, 40mg51m days, 20mg 17mdays    Dispense:  18 tablet    Refill:  0    BP (!) 144/99 (BP Location: Left Arm, Patient Position: Sitting, Cuff Size: Large)   Pulse 86   Temp 98.1 F (36.7 C) (Temporal)   Ht 5' 6"  (1.676 m)   Wt (!) 319 lb 6.4 oz (144.9 kg)   LMP 06/09/2019   SpO2 99%   BMI 51.55 kg/m   Physical Exam   General appearance: alert, well appearing, and in no distress, oriented to person, place, and time and anxious. Chest: clear to auscultation, no wheezes, rales or rhonchi, symmetric air entry.  CVS exam: normal rate, regular rhythm, normal S1, S2, no murmurs, rubs, clicks or gallops. Skin exam - normal coloration and turgor, eczematous plaques noted throughout upper extremities with xerosis.  Mental Status: normal mood, behavior, speech, dress, motor activity, and thought processes,     MDM   1. Elevated blood sugar   2. Essential hypertension, benign   3. Eczema, unspecified type   4. Vitamin D deficiency   5. GAD (generalized anxiety disorder)   6. Pre-diabetes    Meds ordered this encounter  Medications  . triamcinolone acetonide (KENALOG-40) injection 40  mg  . metFORMIN (GLUCOPHAGE XR) 500 MG 24 hr tablet    Sig: Take 1 tablet (500 mg total) by mouth daily with breakfast.    Dispense:  30 tablet    Refill:  3  . predniSONE (DELTASONE) 20 MG tablet    Sig: 53mg x37mays, 40mg x 37mys, 20mg x 4100ms  Dispense:  18 tablet    Refill:  0   F/u in 6 weeks.  He is inline with this plan.   Glyn Ade, NP

## 2019-07-10 ENCOUNTER — Other Ambulatory Visit: Payer: Self-pay

## 2019-07-10 ENCOUNTER — Encounter: Payer: Self-pay | Admitting: Physician Assistant

## 2019-07-10 ENCOUNTER — Ambulatory Visit: Payer: 59 | Admitting: Physician Assistant

## 2019-07-10 DIAGNOSIS — L2089 Other atopic dermatitis: Secondary | ICD-10-CM | POA: Diagnosis not present

## 2019-07-10 DIAGNOSIS — L2084 Intrinsic (allergic) eczema: Secondary | ICD-10-CM | POA: Diagnosis not present

## 2019-07-10 MED ORDER — TRIAMCINOLONE ACETONIDE 0.1 % EX CREA: 1.0000 "application " | TOPICAL_CREAM | Freq: Every day | CUTANEOUS | 2 refills | Status: DC | PRN

## 2019-07-10 MED ORDER — TACROLIMUS 0.1 % EX OINT: TOPICAL_OINTMENT | Freq: Every day | CUTANEOUS | 2 refills | Status: DC

## 2019-07-10 NOTE — Progress Notes (Signed)
   New Patient Visit  Subjective  Rebecca Collier is a 44 y.o. AA female who presents for the following: Eczema (patient has really back eczema all over worse on legs. Tx-clobetasol, triamcinolone and fluocinalone. Patients pcp gave pred taper and shot for itching it cleared up but then came back worse. ). Eczema flare ups that are really bad. Recently thought it was from Tide detergent. She changed to Dreft. Just finished Prednisone Saturday. Eczema has been a problem since her teen years and was worst then on her forearms. No it seems to be reacting to products. Uses Dove soap but has trouble.  Objective  Well appearing patient in no apparent distress; mood and affect are within normal limits.  All skin waist up examined.  Objective  Left Lower Back, Left Upper Arm - Anterior, Left Upper Back, Right Lower Back, Right Upper Arm - Anterior, Right Upper Back: Eczema is fairly clear today as she just finished a dose of prednisone. Lichenification, excoriation and pigment change is noted on her back and upper arms.   Assessment & Plan  Intrinsic (allergic) eczema  Ordered Medications: triamcinolone cream (KENALOG) 0.1 % tacrolimus (PROTOPIC) 0.1 % ointment  Other atopic dermatitis (6) Left Upper Arm - Anterior; Right Upper Arm - Anterior; Left Upper Back; Right Upper Back; Left Lower Back; Right Lower Back  Triamcinolone cream bid. Try to keep/get back clear to have patch testing done. She will change laundry products to all free and clear and no dryer sheets or fabric softener. We also discussed the possible addition of Dupixent if patch testing doesn'Rebecca show [place] anything.   She is to call if she reflares as the prednisone continues to wear out.  Discussed patch testing at length. Discussed contact allergens/irritants. Discussed skin care.  Discussed cons of prednisone Discussed Dupixent and booklet given.

## 2019-07-10 NOTE — Progress Notes (Signed)
Patient given dupixent brochure and samples and coupons of Cerave.

## 2019-07-21 ENCOUNTER — Other Ambulatory Visit: Payer: Self-pay

## 2019-07-21 ENCOUNTER — Ambulatory Visit (INDEPENDENT_AMBULATORY_CARE_PROVIDER_SITE_OTHER): Payer: 59

## 2019-07-21 DIAGNOSIS — L209 Atopic dermatitis, unspecified: Secondary | ICD-10-CM

## 2019-07-21 NOTE — Progress Notes (Signed)
1-65 placed on patients back Dr Denna Haggard spoke with patient

## 2019-07-21 NOTE — Addendum Note (Signed)
Addended by: Sheran Lawless on: 07/21/2019 10:37 AM   Modules accepted: Level of Service

## 2019-07-23 ENCOUNTER — Other Ambulatory Visit: Payer: Self-pay

## 2019-07-23 ENCOUNTER — Ambulatory Visit (INDEPENDENT_AMBULATORY_CARE_PROVIDER_SITE_OTHER): Payer: 59

## 2019-07-23 DIAGNOSIS — L209 Atopic dermatitis, unspecified: Secondary | ICD-10-CM

## 2019-07-23 DIAGNOSIS — L2084 Intrinsic (allergic) eczema: Secondary | ICD-10-CM

## 2019-07-23 DIAGNOSIS — L2089 Other atopic dermatitis: Secondary | ICD-10-CM

## 2019-07-23 NOTE — Progress Notes (Signed)
Patient here today for patches removal, patient states her back is itching.  Patient will return on Friday for final reading.

## 2019-07-25 ENCOUNTER — Encounter: Payer: Self-pay | Admitting: Physician Assistant

## 2019-07-25 ENCOUNTER — Ambulatory Visit: Payer: 59 | Admitting: Physician Assistant

## 2019-07-25 ENCOUNTER — Other Ambulatory Visit: Payer: Self-pay

## 2019-07-25 DIAGNOSIS — L2389 Allergic contact dermatitis due to other agents: Secondary | ICD-10-CM

## 2019-07-25 MED ORDER — DUPIXENT 300 MG/2ML ~~LOC~~ SOAJ: 600.0000 mg | Freq: Once | SUBCUTANEOUS | 0 refills | Status: AC

## 2019-07-25 MED ORDER — DUPILUMAB 300 MG/2ML ~~LOC~~ SOSY
600.0000 mg | PREFILLED_SYRINGE | Freq: Once | SUBCUTANEOUS | Status: AC
  Administered 2019-07-25: 600 mg via SUBCUTANEOUS

## 2019-07-25 NOTE — Progress Notes (Addendum)
   Follow-Up Visit   Subjective  Rebecca Collier is a 44 y.o. female who presents for the following: Follow-up (READ PATCHES). She discontinued prednisone 2-3 weeks ago, the rash is coming back and she is itching profusely. In addition she does have allergies and uses an inhaler although she has not been evaluated by a pulmonologist.    The following portions of the chart were reviewed this encounter and updated as appropriate: Tobacco  Allergies  Meds  Problems  Med Hx  Surg Hx  Fam Hx      Objective  Well appearing patient in no apparent distress; mood and affect are within normal limits.  A full examination was performed including scalp, head, eyes, ears, nose, lips, neck, chest, axillae, abdomen, back, buttocks, bilateral upper extremities, bilateral lower extremities, hands, feet, fingers, toes, fingernails, and toenails. All findings within normal limits unless otherwise noted below.  Assessment & Plan  Allergic contact dermatitis due to other agents (3) Left Upper Back; Left Lower Back (2)  Avoid chemicals-  #9 neomycin sulfate, (2+) #10 thuiuram mix (1+)  #33 propylene glycol(1)  Dupixent start today. Had labs drawn on 04/23/19. No red flags noted

## 2019-07-25 NOTE — Progress Notes (Signed)
Started patient on Dupixent. We will fax everything to [employer] Donna Bernard and Iantha Fallen for the Prior Authorization. Gave patient her initial dose 600 mg.  300 mg in the right thigh' 300 mg in the left thigh   Patient tolerated well no problems or concerns. Sent patient home with 2 shots to get her through till we get approval.   L[facility] [id number] [phone]

## 2019-07-29 ENCOUNTER — Telehealth: Payer: Self-pay | Admitting: *Deleted

## 2019-07-29 NOTE — Telephone Encounter (Signed)
Dupixent forms and insurance faxed to [facility] [phone] and [employer] [phone]

## 2019-08-05 ENCOUNTER — Telehealth: Payer: Self-pay

## 2019-08-05 ENCOUNTER — Ambulatory Visit: Payer: 59 | Admitting: Adult Health Nurse Practitioner

## 2019-08-05 NOTE — Telephone Encounter (Signed)
Faxed the chart note on tried and failed medications for dupixent approval.

## 2019-08-11 NOTE — Telephone Encounter (Signed)
Fax received from [facility] with approval for the patient's Dupixent from 08/08/2019-12/08/2019.

## 2019-08-12 ENCOUNTER — Other Ambulatory Visit: Payer: Self-pay

## 2019-08-12 DIAGNOSIS — F411 Generalized anxiety disorder: Secondary | ICD-10-CM

## 2019-08-12 MED ORDER — BUPROPION HCL ER (XL) 300 MG PO TB24: 300.0000 mg | ORAL_TABLET | Freq: Every day | ORAL | 3 refills | Status: DC

## 2019-08-13 ENCOUNTER — Telehealth: Payer: Self-pay

## 2019-08-13 NOTE — Telephone Encounter (Signed)
Fax received from [place] with approval for [place] from 08/08/2019-12/08/2019.

## 2019-08-15 ENCOUNTER — Other Ambulatory Visit: Payer: Self-pay

## 2019-08-15 ENCOUNTER — Telehealth: Payer: Self-pay

## 2019-08-15 DIAGNOSIS — R739 Hyperglycemia, unspecified: Secondary | ICD-10-CM

## 2019-08-15 DIAGNOSIS — I10 Essential (primary) hypertension: Secondary | ICD-10-CM

## 2019-08-15 MED ORDER — AMLODIPINE BESYLATE 10 MG PO TABS: 10.0000 mg | ORAL_TABLET | Freq: Every day | ORAL | 2 refills | Status: DC

## 2019-08-15 MED ORDER — QUETIAPINE FUMARATE 25 MG PO TABS: 25.0000 mg | ORAL_TABLET | Freq: Every day | ORAL | 1 refills | Status: DC

## 2019-08-20 ENCOUNTER — Ambulatory Visit: Payer: 59 | Admitting: Registered Nurse

## 2019-08-21 ENCOUNTER — Encounter: Payer: Self-pay | Admitting: Registered Nurse

## 2019-09-04 ENCOUNTER — Ambulatory Visit: Payer: 59 | Admitting: Physician Assistant

## 2019-09-19 ENCOUNTER — Ambulatory Visit: Payer: 59 | Admitting: Physician Assistant

## 2019-11-03 ENCOUNTER — Other Ambulatory Visit: Payer: Self-pay | Admitting: Family Medicine

## 2019-11-03 DIAGNOSIS — I10 Essential (primary) hypertension: Secondary | ICD-10-CM

## 2019-11-05 ENCOUNTER — Other Ambulatory Visit: Payer: Self-pay | Admitting: Family Medicine

## 2019-11-05 DIAGNOSIS — F411 Generalized anxiety disorder: Secondary | ICD-10-CM

## 2019-11-05 NOTE — Telephone Encounter (Signed)
Requested Prescriptions  Pending Prescriptions Disp Refills  . buPROPion (WELLBUTRIN XL) 300 MG 24 hr tablet [Pharmacy Med Name: BUPROPION HCL XL 300 MG TABLET] 90 tablet 0    Sig: TAKE 1 TABLET BY MOUTH EVERY DAY     Psychiatry: Antidepressants - bupropion Failed - 11/05/2019 12:06 AM      Failed - Last BP in normal range    BP Readings from Last 1 Encounters:  06/24/19 (!) 144/99         Passed - Valid encounter within last 6 months    Recent Outpatient Visits          4 months ago Elevated blood sugar   Primary Care at [facility], Lorelee Market, NP   5 months ago Elevated blood sugar   Primary Care at [facility], Lorelee Market, NP   6 months ago Well female exam with routine gynecological exam   Primary Care at [facility], Lorelee Market, NP   7 months ago Rash and nonspecific skin eruption   [facility] at [facility], Delfino Lovett, NP

## 2019-12-23 NOTE — Telephone Encounter (Signed)
na

## 2019-12-30 ENCOUNTER — Telehealth: Payer: Self-pay

## 2019-12-30 NOTE — Telephone Encounter (Signed)
Fax received from [place] stating that the patient's Dupixent can't be filled through them it can only be filled through [facility].  Per the fax Iantha Fallen will transfer the prescription to [facility].

## 2019-12-31 ENCOUNTER — Other Ambulatory Visit: Payer: Self-pay

## 2019-12-31 ENCOUNTER — Other Ambulatory Visit: Payer: Self-pay | Admitting: Registered Nurse

## 2019-12-31 DIAGNOSIS — F411 Generalized anxiety disorder: Secondary | ICD-10-CM

## 2019-12-31 DIAGNOSIS — F5104 Psychophysiologic insomnia: Secondary | ICD-10-CM

## 2019-12-31 MED ORDER — BUPROPION HCL ER (XL) 300 MG PO TB24: 300.0000 mg | ORAL_TABLET | Freq: Every day | ORAL | 0 refills | Status: DC

## 2019-12-31 NOTE — Telephone Encounter (Signed)
Requested medication (s) are due for refill today: yes  Requested medication (s) are on the active medication list: yes  Last refill:  10/06/19  Future visit scheduled: no  Notes to clinic:  no valid encounter within last 6 months; no upcoming visit noted  Requested Prescriptions  Pending Prescriptions Disp Refills   traZODone (DESYREL) 50 MG tablet [Pharmacy Med Name: TRAZODONE 50 MG TABLET] 90 tablet 2    Sig: TAKE 1/2 TO 1 TABLET BY MOUTH AT BEDTIME AS NEEDED FOR SLEEP      Psychiatry: Antidepressants - Serotonin Modulator Failed - 12/31/2019  9:06 AM      Failed - Valid encounter within last 6 months    Recent Outpatient Visits           6 months ago Elevated blood sugar   Primary Care at [facility], Lorelee Market, NP   7 months ago Elevated blood sugar   [facility] Care at [facility], Lorelee Market, NP   8 months ago Well female exam with routine gynecological exam   Primary Care at [facility], Lorelee Market, NP   9 months ago Rash and nonspecific skin eruption   [facility] at [facility], Delfino Lovett, NP

## 2019-12-31 NOTE — Telephone Encounter (Signed)
Patient is requesting a refill of the following medications: Requested Prescriptions   Pending Prescriptions Disp Refills  . traZODone (DESYREL) 50 MG tablet [Pharmacy Med Name: TRAZODONE 50 MG TABLET] 90 tablet 0    Sig: TAKE 1/2 TO 1 TABLET BY MOUTH AT BEDTIME AS NEEDED FOR SLEEP    Date of patient request: 12/31/19 Last office visit: 06/24/19 Date of last refill: 04/04/19  Last refill amount: 90 +2 Follow up time period per chart: 01/02/20

## 2020-01-02 ENCOUNTER — Ambulatory Visit: Payer: 59 | Admitting: Registered Nurse

## 2020-01-02 ENCOUNTER — Other Ambulatory Visit: Payer: Self-pay

## 2020-01-02 ENCOUNTER — Encounter: Payer: Self-pay | Admitting: Registered Nurse

## 2020-01-02 VITALS — BP 143/93 | HR 86 | Temp 97.9°F | Resp 18 | Ht 66.0 in | Wt 325.0 lb

## 2020-01-02 DIAGNOSIS — R739 Hyperglycemia, unspecified: Secondary | ICD-10-CM

## 2020-01-02 DIAGNOSIS — F5104 Psychophysiologic insomnia: Secondary | ICD-10-CM

## 2020-01-02 DIAGNOSIS — R69 Illness, unspecified: Secondary | ICD-10-CM | POA: Diagnosis not present

## 2020-01-02 DIAGNOSIS — F411 Generalized anxiety disorder: Secondary | ICD-10-CM

## 2020-01-02 DIAGNOSIS — Z6841 Body Mass Index (BMI) 40.0 and over, adult: Secondary | ICD-10-CM | POA: Diagnosis not present

## 2020-01-02 DIAGNOSIS — I10 Essential (primary) hypertension: Secondary | ICD-10-CM | POA: Diagnosis not present

## 2020-01-02 DIAGNOSIS — E66813 Obesity, class 3: Secondary | ICD-10-CM

## 2020-01-02 DIAGNOSIS — E559 Vitamin D deficiency, unspecified: Secondary | ICD-10-CM | POA: Diagnosis not present

## 2020-01-02 MED ORDER — QUETIAPINE FUMARATE 25 MG PO TABS: 25.0000 mg | ORAL_TABLET | Freq: Every day | ORAL | 1 refills | Status: DC

## 2020-01-02 MED ORDER — AMLODIPINE BESYLATE 10 MG PO TABS: 10.0000 mg | ORAL_TABLET | Freq: Every day | ORAL | 1 refills | Status: DC

## 2020-01-02 MED ORDER — TRAZODONE HCL 50 MG PO TABS: ORAL_TABLET | ORAL | 2 refills | Status: DC

## 2020-01-02 MED ORDER — BUPROPION HCL ER (XL) 300 MG PO TB24: 300.0000 mg | ORAL_TABLET | Freq: Every day | ORAL | 1 refills | Status: DC

## 2020-01-02 MED ORDER — LOSARTAN POTASSIUM 50 MG PO TABS: 50.0000 mg | ORAL_TABLET | Freq: Every day | ORAL | 3 refills | Status: DC

## 2020-01-02 NOTE — Progress Notes (Signed)
Established Patient Office Visit  Subjective:  Patient ID: Rebecca Collier, female    DOB: 06-Dec-1975  Age: 44 y.o. MRN: [id number]  CC:  Chief Complaint  Patient presents with   Medication Refill    Patient states she is here for an medication refill.    HPI Rebecca Collier presents for 6 mo med check  HTN: taking amlodipine 39m PO qd. No AEs. bp high today. No CV symptoms.  Insomnia: taking trazodone 25-528mPO qhs PRN and seroquel 2580mO qhs. Good effect. Sleeping well without many disturbances. Would like to continue  Anxiety/Depression: taking bupropion 300m65m PO qd. Good effect. Feeling very good about this medication and hopes to continue  Elevated blood sugar: has run out of metformin 500mg23mpo qd for some time now - last A1c was at 6.0. Pt unsure whether or not she needs to continue. Will check labs  No further concerns. Feeling well overall.   Past Medical History:  Diagnosis Date   Allergy    Anxiety    Eczema    Hypertension    Obesity    Sciatica    spasms daily in left leg     No past surgical history on file.  Family History  Problem Relation Age of Onset   Colon cancer Mother        dx at age   Hypertension Mother    Cancer Mother        colon   Healthy Father    Healthy Brother     Social History   Socioeconomic History   Marital status: Single    Spouse name: Not on file   Number of children: Not on file   Years of education: Not on file   Highest education level: Not on file  Occupational History   Not on file  Tobacco Use   Smoking status: Never Smoker   Smokeless tobacco: Never Used  Substance and Sexual Activity   Alcohol use: Yes    Comment: socially   Drug use: No   Sexual activity: Not on file  Other Topics Concern   Not on file  Social History Narrative   Work or School: lab tQuarry managerXXPhysicist, [employer].      Home Situation: lives with mother      Spiritual Beliefs: none      Lifestyle:  no regular exercise; diet is ok         Social Determinants of [employer]alt[employer] broadcast assistantin:    Difficulty of Paying Living Expenses: Not on file  Food Insecurity:    Worried About RunniCharity fundraiserhe Last Year: Not on file   Ran [employer] in the Last Year: Not on file  Transportation Needs:    Lack of Transportation (Medical): Not on file   Lack of Transportation (Non-Medical): Not on file  Physical Activity:    Days of Exercise per Week: Not on file   Minutes of Exercise per Session: Not on file  Stress:    Feeling of Stress : Not on file  Social Connections:    Frequency of Communication with Friends and Family: Not on file   Frequency of Social Gatherings with Friends and Family: Not on file   Attends Religious Services: Not on file   Active Member of Clubs or Organizations: Not on file   Attends Club Archivistings: Not on file   Marital Status: Not on file  Intimate Partner Violence:  Fear of Current or Ex-Partner: Not on file   Emotionally Abused: Not on file   Physically Abused: Not on file   Sexually Abused: Not on file    Outpatient Medications Prior to Visit  Medication Sig Dispense Refill   albuterol (VENTOLIN HFA) 108 (90 Base) MCG/ACT inhaler Inhale 2 puffs into the lungs every 4 (four) hours as needed for wheezing or shortness of breath. 18 g 2   Clobetasol Prop Emollient Base 0.05 % emollient cream Apply to affected area bid 60 g 2   hydrOXYzine (ATARAX/VISTARIL) 25 MG tablet 1 to 2 tabs q 6 hours prn for itching 30 tablet 0   ibuprofen (ADVIL) 200 MG tablet Take 600 mg by mouth every 6 (six) hours as needed for moderate pain.     traZODone (DESYREL) 50 MG tablet TAKE 1/2 TO 1 TABLET BY MOUTH AT BEDTIME AS NEEDED FOR SLEEP 90 tablet 0   amLODipine (NORVASC) 10 MG tablet TAKE 1 TABLET BY MOUTH EVERY DAY 90 tablet 0   buPROPion (WELLBUTRIN XL) 300 MG 24 hr tablet Take 1 tablet (300 mg total) by mouth daily. 90  tablet 0   QUEtiapine (SEROQUEL) 25 MG tablet Take 1 tablet (25 mg total) by mouth at bedtime. 30 tablet 1   BLACK CURRANT SEED OIL PO Take by mouth daily. (Patient not taking: Reported on 01/02/2020)     [facility] ALLERGY 25 MG capsule Take by mouth every 6 (six) hours as needed.     escitalopram (LEXAPRO) 10 MG tablet TAKE 1 TABLET BY MOUTH EVERY DAY 90 tablet 0   Ferrous Sulfate (IRON PO) Take by mouth daily.     fluocinolone (VANOS) 0.01 % cream APPLY TOPICALLY 2 (TWO) TIMES DAILY FOR 15 DAYS.     hydrochlorothiazide (HYDRODIURIL) 25 MG tablet Take 1 tablet (25 mg total) by mouth daily. 30 tablet 3   lisinopril-hydrochlorothiazide (ZESTORETIC) 20-25 MG tablet Take 1 tablet by mouth daily. 90 tablet 3   Magnesium Hydroxide (MAGNESIA PO) Take by mouth daily.     metFORMIN (GLUCOPHAGE XR) 500 MG 24 hr tablet Take 1 tablet (500 mg total) by mouth daily with breakfast. 30 tablet 3   methylPREDNISolone (MEDROL DOSEPAK) 4 MG TBPK tablet As directed on pkg label (Patient not taking: Reported on 01/02/2020) 21 each 0   OVER THE COUNTER MEDICATION daily. (Patient not taking: Reported on 01/02/2020)     OVER THE COUNTER MEDICATION daily. (Patient not taking: Reported on 01/02/2020)     predniSONE (DELTASONE) 20 MG tablet 106m x 2 days, 4107mx 4 days, 2034m 4 days (Patient not taking: Reported on 01/02/2020) 18 tablet 0   tacrolimus (PROTOPIC) 0.1 % ointment Apply topically daily. (Patient not taking: Reported on 01/02/2020) 100 g 2   triamcinolone cream (KENALOG) 0.1 % APPLY TO AFFECTED AREA TWICE A DAY 30 g 0   triamcinolone cream (KENALOG) 0.1 % Apply 1 application topically daily as needed. (Patient not taking: Reported on 01/02/2020) 453 g 2   VITAMIN D PO Take by mouth. (Patient not taking: Reported on 01/02/2020)     No facility-administered medications prior to visit.    Allergies  Allergen Reactions   Penicillins Rash    ROS Review of Systems  Constitutional: Negative.   HENT:  Negative.   Eyes: Negative.   Respiratory: Negative.   Cardiovascular: Negative.   Gastrointestinal: Negative.   Genitourinary: Negative.   Musculoskeletal: Negative.   Skin: Negative.   Neurological: Negative.   Psychiatric/Behavioral: Negative.  Objective:    Physical Exam Vitals and nursing note reviewed.  Constitutional:      General: She is not in acute distress.    Appearance: Normal appearance. She is normal weight. She is not ill-appearing, toxic-appearing or diaphoretic.  Cardiovascular:     Rate and Rhythm: Normal rate and regular rhythm.     Heart sounds: Normal heart sounds. No murmur heard.  No friction rub. No gallop.   Pulmonary:     Effort: Pulmonary effort is normal. No respiratory distress.     Breath sounds: Normal breath sounds. No stridor. No wheezing, rhonchi or rales.  Chest:     Chest wall: No tenderness.  Skin:    General: Skin is warm and dry.  Neurological:     General: No focal deficit present.     Mental Status: She is alert and oriented to person, place, and time. Mental status is at baseline.  Psychiatric:        Mood and Affect: Mood normal.        Behavior: Behavior normal.        Thought Content: Thought content normal.        Judgment: Judgment normal.     Pulse 86    Temp 97.9 F (36.6 C) (Temporal)    Resp 18    Ht 5' 6"  (1.676 m)    Wt (!) 325 lb (147.4 kg)    SpO2 99%    BMI 52.46 kg/m  Wt Readings from Last 3 Encounters:  01/02/20 (!) 325 lb (147.4 kg)  06/24/19 (!) 319 lb 6.4 oz (144.9 kg)  05/21/19 (!) 321 lb 6.4 oz (145.8 kg)     There are no preventive care reminders to display for this patient.  There are no preventive care reminders to display for this patient.  Lab Results  Component Value Date   TSH 0.744 03/19/2019   Lab Results  Component Value Date   WBC 5.4 04/23/2019   HGB 14.2 04/23/2019   HCT 42.8 04/23/2019   MCV 86 04/23/2019   PLT 361 04/23/2019   Lab Results  Component Value Date   NA  136 03/19/2019   K 3.6 03/19/2019   CO2 27 03/19/2019   GLUCOSE 103 (H) 03/19/2019   BUN 15 03/19/2019   CREATININE 0.95 03/19/2019   BILITOT 0.3 03/19/2019   ALKPHOS 99 03/19/2019   AST 70 (H) 03/19/2019   ALT 85 (H) 03/19/2019   PROT 7.3 03/19/2019   ALBUMIN 4.0 03/19/2019   CALCIUM 10.0 03/19/2019   ANIONGAP 9 01/18/2019   GFR 103.96 03/24/2015   Lab Results  Component Value Date   CHOL 211 (H) 03/19/2019   Lab Results  Component Value Date   HDL 73 03/19/2019   Lab Results  Component Value Date   LDLCALC 115 (H) 03/19/2019   Lab Results  Component Value Date   TRIG 131 03/19/2019   Lab Results  Component Value Date   CHOLHDL 2.9 03/19/2019   Lab Results  Component Value Date   HGBA1C 6.0 (H) 03/19/2019      Assessment & Plan:   Problem List Items Addressed This Visit      Cardiovascular and Mediastinum   Essential hypertension, benign (Chronic)   Relevant Medications   amLODipine (NORVASC) 10 MG tablet   Other Relevant Orders   CBC With Differential   Comprehensive metabolic panel     Other   Class 3 severe obesity with body mass index (BMI) of 50.0 to 59.9  in adult [facility]) (Chronic)   Relevant Orders   Lipid panel   Psychophysiological insomnia (Chronic)   Relevant Medications   QUEtiapine (SEROQUEL) 25 MG tablet   traZODone (DESYREL) 50 MG tablet   GAD (generalized anxiety disorder) (Chronic)   Relevant Medications   buPROPion (WELLBUTRIN XL) 300 MG 24 hr tablet   QUEtiapine (SEROQUEL) 25 MG tablet   traZODone (DESYREL) 50 MG tablet   Vitamin D deficiency (Chronic)   Relevant Orders   Vitamin D, 25-hydroxy   Elevated blood sugar - Primary   Relevant Orders   Hemoglobin A1c      Meds ordered this encounter  Medications   amLODipine (NORVASC) 10 MG tablet    Sig: Take 1 tablet (10 mg total) by mouth daily.    Dispense:  90 tablet    Refill:  1   buPROPion (WELLBUTRIN XL) 300 MG 24 hr tablet    Sig: Take 1 tablet (300 mg total) by  mouth daily.    Dispense:  90 tablet    Refill:  1   QUEtiapine (SEROQUEL) 25 MG tablet    Sig: Take 1 tablet (25 mg total) by mouth at bedtime.    Dispense:  90 tablet    Refill:  1   traZODone (DESYREL) 50 MG tablet    Sig: TAKE 1/2 TO 1 TABLET AT BEDTIME AS NEEDED FOR SLEEP    Dispense:  90 tablet    Refill:  2    Follow-up: No follow-ups on file.   PLAN  BP at end of visit: 143/93  Will start on losartan 62m PO qd  Return in 2 weeks for nurse visit bp check  Labs collected, will follow up as warranted  meds reordered as above  Return in 6 mo  Patient encouraged to call clinic with any questions, comments, or concerns.  RMaximiano Coss NP

## 2020-01-02 NOTE — Patient Instructions (Signed)
° ° ° °  If you have lab work done today you will be contacted with your lab results within the next 2 weeks.  If you have not heard from us then please contact us. The fastest way to get your results is to register for My Chart. ° ° °IF you received an x-ray today, you will receive an invoice from [facility]. Please contact [facility] at [phone] with questions or concerns regarding your invoice.  ° °IF you received labwork today, you will receive an invoice from [facility]. Please contact [facility] at [phone] with questions or concerns regarding your invoice.  ° °Our billing staff will not be able to assist you with questions regarding bills from these companies. ° °You will be contacted with the lab results as soon as they are available. The fastest way to get your results is to activate your My Chart account. Instructions are located on the last page of this paperwork. If you have not heard from us regarding the results in 2 weeks, please contact this office. °  ° ° ° °

## 2020-01-03 LAB — COMPREHENSIVE METABOLIC PANEL
ALT: 14 IU/L (ref 0–32)
AST: 11 IU/L (ref 0–40)
Albumin/Globulin Ratio: 1.4 (ref 1.2–2.2)
Albumin: 4.1 g/dL (ref 3.8–4.8)
Alkaline Phosphatase: 96 IU/L (ref 44–121)
BUN/Creatinine Ratio: 12 (ref 9–23)
BUN: 11 mg/dL (ref 6–24)
Bilirubin Total: <0.2 mg/dL (ref 0.0–1.2)
CO2: 22 mmol/L (ref 20–29)
Calcium: 10.1 mg/dL (ref 8.7–10.2)
Chloride: 102 mmol/L (ref 96–106)
Creatinine, Ser: 0.89 mg/dL (ref 0.57–1.00)
GFR calc Af Amer: 91 mL/min/{1.73_m2} (ref >59)
GFR calc non Af Amer: 79 mL/min/{1.73_m2} (ref >59)
Globulin, Total: 3 g/dL (ref 1.5–4.5)
Glucose: 100 mg/dL — ABNORMAL HIGH (ref 65–99)
Potassium: 4.5 mmol/L (ref 3.5–5.2)
Sodium: 140 mmol/L (ref 134–144)
Total Protein: 7.1 g/dL (ref 6.0–8.5)

## 2020-01-03 LAB — CBC WITH DIFFERENTIAL
Basophils Absolute: 0 10*3/uL (ref 0.0–0.2)
Basos: 1 %
EOS (ABSOLUTE): 0.2 10*3/uL (ref 0.0–0.4)
Eos: 3 %
Hematocrit: 40.4 % (ref 34.0–46.6)
Hemoglobin: 13.5 g/dL (ref 11.1–15.9)
Immature Grans (Abs): 0 10*3/uL (ref 0.0–0.1)
Immature Granulocytes: 0 %
Lymphocytes Absolute: 1.6 10*3/uL (ref 0.7–3.1)
Lymphs: 32 %
MCH: 28.7 pg (ref 26.6–33.0)
MCHC: 33.4 g/dL (ref 31.5–35.7)
MCV: 86 fL (ref 79–97)
Monocytes Absolute: 0.4 10*3/uL (ref 0.1–0.9)
Monocytes: 8 %
Neutrophils Absolute: 2.8 10*3/uL (ref 1.4–7.0)
Neutrophils: 56 %
RBC: 4.7 x10E6/uL (ref 3.77–5.28)
RDW: 12.7 % (ref 11.7–15.4)
WBC: 5.1 10*3/uL (ref 3.4–10.8)

## 2020-01-03 LAB — LIPID PANEL
Chol/HDL Ratio: 3.2 ratio (ref 0.0–4.4)
Cholesterol, Total: 227 mg/dL — ABNORMAL HIGH (ref 100–199)
HDL: 70 mg/dL (ref >39)
LDL Chol Calc (NIH): 146 mg/dL — ABNORMAL HIGH (ref 0–99)
Triglycerides: 65 mg/dL (ref 0–149)
VLDL Cholesterol Cal: 11 mg/dL (ref 5–40)

## 2020-01-03 LAB — VITAMIN D 25 HYDROXY (VIT D DEFICIENCY, FRACTURES): Vit D, 25-Hydroxy: 22 ng/mL — ABNORMAL LOW (ref 30.0–100.0)

## 2020-01-03 LAB — HEMOGLOBIN A1C
Est. average glucose Bld gHb Est-mCnc: 123 mg/dL
Hgb A1c MFr Bld: 5.9 % — ABNORMAL HIGH (ref 4.8–5.6)

## 2020-01-20 ENCOUNTER — Other Ambulatory Visit: Payer: Self-pay | Admitting: Physician Assistant

## 2020-02-17 ENCOUNTER — Encounter: Payer: Self-pay | Admitting: Registered Nurse

## 2020-02-25 ENCOUNTER — Telehealth: Payer: Self-pay | Admitting: *Deleted

## 2020-02-25 NOTE — Telephone Encounter (Signed)
Phone call to patient to check on her Dupixent to make sure she was getting it ok. She is getting her shots and taking them like she should. He Rash is gone but patient is still experiencing itching espically on back area. She made an appointment with Vida Roller to follow up.

## 2020-03-05 ENCOUNTER — Encounter: Payer: Self-pay | Admitting: Registered Nurse

## 2020-03-05 ENCOUNTER — Other Ambulatory Visit: Payer: Self-pay | Admitting: Registered Nurse

## 2020-03-05 DIAGNOSIS — I10 Essential (primary) hypertension: Secondary | ICD-10-CM

## 2020-03-05 MED ORDER — AMLODIPINE BESYLATE 10 MG PO TABS: 10.0000 mg | ORAL_TABLET | Freq: Every day | ORAL | 1 refills | Status: DC

## 2020-03-05 NOTE — Telephone Encounter (Signed)
Copied from [place] [id number]. Topic: Quick Communication - Rx Refill/Question >> Mar 05, 2020  1:35 PM Tessa Lerner A wrote: Medication: amLODipine (NORVASC) - patient has two tablets left   Has the patient contacted their pharmacy? Yes - patient was unable to fulfill this at the pharmacy  Preferred Pharmacy (with phone number or street name[id number] [place] [phone] Agent: Please be advised that RX refills may take up to 3 business days. We ask that you follow-up with your pharmacy.

## 2020-03-05 NOTE — Telephone Encounter (Signed)
  Notes to clinic: Patient states that the Losartan is too strong Patient should have refills at the pharmacy    Requested Prescriptions  Pending Prescriptions Disp Refills   amLODipine (NORVASC) 10 MG tablet 90 tablet 1    Sig: Take 1 tablet (10 mg total) by mouth daily.      Cardiovascular:  Calcium Channel Blockers Failed - 03/05/2020  1:41 PM      Failed - Last BP in normal range    BP Readings from Last 1 Encounters:  01/02/20 (!) 143/93          Passed - Valid encounter within last 6 months    Recent Outpatient Visits           2 months ago Elevated blood sugar   [facility], [facility]   8 months ago Elevated blood sugar   Primary Care at [facility], Lorelee Market, NP   9 months ago Elevated blood sugar   Primary Care at [facility], Lorelee Market, NP   10 months ago Well female exam with routine gynecological exam   Primary Care at [facility], Lorelee Market, NP   12 months ago Rash and nonspecific skin eruption   [facility] at [facility], Delfino Lovett, NP       Future Appointments             In 1 month [place], Ronalee Red, [facility]   In 3 months Maximiano Coss, NP Primary Care at [place], [facility]

## 2020-03-08 ENCOUNTER — Other Ambulatory Visit: Payer: Self-pay | Admitting: Registered Nurse

## 2020-03-08 DIAGNOSIS — I10 Essential (primary) hypertension: Secondary | ICD-10-CM

## 2020-03-08 MED ORDER — AMLODIPINE BESYLATE 10 MG PO TABS: 10.0000 mg | ORAL_TABLET | Freq: Every day | ORAL | 1 refills | Status: DC

## 2020-04-07 ENCOUNTER — Ambulatory Visit: Payer: 59 | Admitting: Physician Assistant

## 2020-05-27 ENCOUNTER — Other Ambulatory Visit: Payer: Self-pay | Admitting: Dermatology

## 2020-06-30 ENCOUNTER — Other Ambulatory Visit: Payer: Self-pay | Admitting: Registered Nurse

## 2020-06-30 DIAGNOSIS — F411 Generalized anxiety disorder: Secondary | ICD-10-CM

## 2020-06-30 DIAGNOSIS — F5104 Psychophysiologic insomnia: Secondary | ICD-10-CM

## 2020-07-02 ENCOUNTER — Ambulatory Visit: Payer: Self-pay | Admitting: Registered Nurse

## 2020-07-06 ENCOUNTER — Other Ambulatory Visit: Payer: Self-pay | Admitting: Registered Nurse

## 2020-07-06 DIAGNOSIS — F5104 Psychophysiologic insomnia: Secondary | ICD-10-CM

## 2020-07-07 MED ORDER — TRAZODONE HCL 50 MG PO TABS: 25.0000 mg | ORAL_TABLET | Freq: Every evening | ORAL | 0 refills | Status: DC | PRN

## 2020-07-07 NOTE — Telephone Encounter (Signed)
[facility] keeps routing Rx request to this practice. Patient requesting refill on Trazodone.

## 2020-08-16 ENCOUNTER — Other Ambulatory Visit: Payer: Self-pay | Admitting: Dermatology

## 2020-08-23 ENCOUNTER — Other Ambulatory Visit: Payer: Self-pay | Admitting: Dermatology

## 2020-10-06 ENCOUNTER — Other Ambulatory Visit: Payer: Self-pay | Admitting: Physician Assistant

## 2020-10-22 ENCOUNTER — Other Ambulatory Visit: Payer: Self-pay | Admitting: Physician Assistant

## 2020-11-01 ENCOUNTER — Other Ambulatory Visit: Payer: Self-pay | Admitting: Physician Assistant

## 2020-12-14 ENCOUNTER — Emergency Department (HOSPITAL_BASED_OUTPATIENT_CLINIC_OR_DEPARTMENT_OTHER)
Admission: EM | Admit: 2020-12-14 | Discharge: 2020-12-14 | Disposition: A | Discharge status: Home / Self Care | Payer: 59 | Attending: Emergency Medicine | Admitting: Emergency Medicine

## 2020-12-14 ENCOUNTER — Emergency Department (HOSPITAL_BASED_OUTPATIENT_CLINIC_OR_DEPARTMENT_OTHER): Payer: 59

## 2020-12-14 ENCOUNTER — Encounter (HOSPITAL_BASED_OUTPATIENT_CLINIC_OR_DEPARTMENT_OTHER): Payer: Self-pay | Admitting: *Deleted

## 2020-12-14 DIAGNOSIS — Z79899 Other long term (current) drug therapy: Secondary | ICD-10-CM | POA: Insufficient documentation

## 2020-12-14 DIAGNOSIS — M1711 Unilateral primary osteoarthritis, right knee: Secondary | ICD-10-CM | POA: Diagnosis not present

## 2020-12-14 DIAGNOSIS — M7989 Other specified soft tissue disorders: Secondary | ICD-10-CM | POA: Diagnosis not present

## 2020-12-14 DIAGNOSIS — R52 Pain, unspecified: Secondary | ICD-10-CM

## 2020-12-14 DIAGNOSIS — X501XXA Overexertion from prolonged static or awkward postures, initial encounter: Secondary | ICD-10-CM | POA: Diagnosis not present

## 2020-12-14 DIAGNOSIS — I10 Essential (primary) hypertension: Secondary | ICD-10-CM | POA: Diagnosis not present

## 2020-12-14 DIAGNOSIS — M79661 Pain in right lower leg: Secondary | ICD-10-CM | POA: Diagnosis not present

## 2020-12-14 DIAGNOSIS — M25561 Pain in right knee: Secondary | ICD-10-CM

## 2020-12-14 MED ORDER — HYDROCODONE-ACETAMINOPHEN 5-325 MG PO TABS: 1.0000 | ORAL_TABLET | Freq: Four times a day (QID) | ORAL | 0 refills | Status: AC | PRN

## 2020-12-14 NOTE — Discharge Instructions (Addendum)
You have been seen and discharged from the emergency department.  Your x-ray showed osteoarthritis which is the cause of your knee pain.  You may use a supportive brace that you can get over-the-counter pharmacy.  Take pain medicine as needed.  Do not mix this medication with alcohol or other sedating medications. Do not drive or do heavy physical activity and to know how this medication affects you.  It may cause drowsiness.  Follow-up with orthopedics for reevaluation and further care. Take home medications as prescribed. If you have any worsening symptoms or further concerns for your health please return to an emergency department for further evaluation.

## 2020-12-14 NOTE — ED Triage Notes (Signed)
Rt leg pain and swelling x 2 weeks,  states knee pops every time she stands  no recent inj

## 2020-12-14 NOTE — ED Provider Notes (Signed)
[place] EMERGENCY DEPARTMENT Provider Note   CSN: [id number] Arrival date & time: 12/14/20  6301     History Chief Complaint  Patient presents with   Leg Swelling    Rebecca Collier is a 45 y.o. female.  HPI  45 year old female with past medical history of obesity presents emergency department right knee pain.  States that the pain is atraumatic, worsening over the past couple days, not improved with Ace wrap or over-the-counter medications.  She also feels like she has had swelling to the right calf for the past 2 weeks.  She has a popping sensation with certain movements.  Is never follow-up with orthopedics or injured the knee before.  Past Medical History:  Diagnosis Date   Allergy    Anxiety    Eczema    Hypertension    Obesity    Sciatica    spasms daily in left leg     Patient Active Problem List   Diagnosis Date Noted   Elevated blood sugar 05/21/2019   Vitamin D deficiency 05/06/2019   Well female exam with routine gynecological exam 04/23/2019   Class 3 severe obesity with body mass index (BMI) of 50.0 to 59.9 in adult [facility]) 04/23/2019   Psychophysiological insomnia 04/23/2019   GAD (generalized anxiety disorder) 04/23/2019   Chronic migraine without aura without status migrainosus, not intractable 04/23/2019   Eczema 02/06/2013   Essential hypertension, benign 02/06/2013   Family history of colon cancer in mother (age 32) 02/06/2013    No past surgical history on file.   OB History   No obstetric history on file.     Family History  Problem Relation Age of Onset   Colon cancer Mother        dx at age   Hypertension Mother    Cancer Mother        colon   Healthy Father    Healthy Brother     Social History   Tobacco Use   Smoking status: Never   Smokeless tobacco: Never  Substance Use Topics   Alcohol use: Yes    Comment: socially   Drug use: No    Home Medications Prior to Admission medications   Medication Sig Start  Date End Date Taking? Authorizing Provider  HYDROcodone-acetaminophen (NORCO) 5-325 MG tablet Take 1 tablet by mouth every 6 (six) hours as needed for up to 3 days for moderate pain. 12/14/20 12/17/20 Yes Juniel Groene, Alvin Critchley, DO  albuterol (VENTOLIN HFA) 108 (90 Base) MCG/ACT inhaler Inhale 2 puffs into the lungs every 4 (four) hours as needed for wheezing or shortness of breath. 05/21/19   Wendall Mola, NP  amLODipine (NORVASC) 10 MG tablet Take 1 tablet (10 mg total) by mouth daily. 03/08/20   Maximiano Coss, NP  buPROPion (WELLBUTRIN XL) 300 MG 24 hr tablet Take 1 tablet (300 mg total) by mouth daily. 01/02/20   Maximiano Coss, NP  Clobetasol Prop Emollient Base 0.05 % emollient cream Apply to affected area bid 04/23/19   Wendall Mola, NP  DUPIXENT 300 MG/2ML SOPN INJECT 1 PEN UNDER THE SKIN EVERY OTHER WEEK 08/23/20   Robyne Askew R, PA-C  Ferrous Sulfate (IRON PO) Take by mouth daily.    [provider]  fluocinolone (VANOS) 0.01 % cream APPLY TOPICALLY 2 (TWO) TIMES DAILY FOR 15 DAYS. 06/21/19   [provider]  hydrOXYzine (ATARAX/VISTARIL) 25 MG tablet 1 to 2 tabs q 6 hours prn for itching 04/23/19   Jens Som  Arnette, NP  ibuprofen (ADVIL) 200 MG tablet Take 600 mg by mouth every 6 (six) hours as needed for moderate pain.    [provider]  metFORMIN (GLUCOPHAGE XR) 500 MG 24 hr tablet Take 1 tablet (500 mg total) by mouth daily with breakfast. 06/24/19   Wendall Mola, NP  methylPREDNISolone (MEDROL DOSEPAK) 4 MG TBPK tablet As directed on pkg label Patient not taking: Reported on 01/02/2020 05/21/19   Wendall Mola, NP  QUEtiapine (SEROQUEL) 25 MG tablet TAKE 1 TABLET BY MOUTH EVERYDAY AT BEDTIME 06/30/20   Maximiano Coss, NP  tacrolimus (PROTOPIC) 0.1 % ointment Apply topically daily. Patient not taking: Reported on 01/02/2020 07/10/19   Clark-Burning, Anderson Malta, PA-C  traZODone (DESYREL) 50 MG tablet TAKE 1/2 TO 1 TABLET AT BEDTIME AS NEEDED  FOR SLEEP 01/02/20   Maximiano Coss, NP  traZODone (DESYREL) 50 MG tablet Take 0.5-1 tablets (25-50 mg total) by mouth at bedtime as needed. for sleep 07/07/20   Maximiano Coss, NP  triamcinolone cream (KENALOG) 0.1 % APPLY TO AFFECTED AREA TWICE A DAY 04/10/19   Maximiano Coss, NP  triamcinolone cream (KENALOG) 0.1 % Apply 1 application topically daily as needed. Patient not taking: Reported on 01/02/2020 07/10/19   Clark-Burning, Anderson Malta, PA-C    Allergies    Lisinopril, Hydrochlorothiazide, and Penicillins  Review of Systems   Review of Systems  Constitutional:  Negative for fever.  Respiratory:  Negative for shortness of breath.   Cardiovascular:  Negative for chest pain.  Gastrointestinal:  Negative for abdominal pain.  Genitourinary:  Negative for dysuria.  Musculoskeletal:  Negative for back pain.       + Right knee pain  Skin:  Negative for rash.  Neurological:  Negative for weakness and headaches.   Physical Exam Updated Vital Signs BP (!) 162/101 (BP Location: Left Arm)   Pulse 79   Temp 98.5 F (36.9 C) (Oral)   Resp 18   Ht 5' 6"  (1.676 m)   Wt (!) 146.5 kg   LMP 11/24/2020 (Approximate)   SpO2 100%   BMI 52.13 kg/m   Physical Exam Vitals and nursing note reviewed.  Constitutional:      Appearance: Normal appearance. She is obese.  HENT:     Head: Normocephalic.     Mouth/Throat:     Mouth: Mucous membranes are moist.  Cardiovascular:     Rate and Rhythm: Normal rate.  Pulmonary:     Effort: Pulmonary effort is normal. No respiratory distress.  Musculoskeletal:     Comments: No significant tenderness to palpation of the bony prominences of the knee, difficult to appreciate leg edema secondary to obese body habitus, legs appear neurovascularly intact with equal strength  Skin:    General: Skin is warm.  Neurological:     Mental Status: She is alert and oriented to person, place, and time. Mental status is at baseline.  Psychiatric:        Mood and  Affect: Mood normal.    ED Results / Procedures / Treatments   Labs (all labs ordered are listed, but only abnormal results are displayed) Labs Reviewed  PREGNANCY, URINE    EKG None  Radiology US Venous Img Lower Unilateral Right (DVT)  Result Date: 12/14/2020 CLINICAL DATA:  Right lower extremity pain. EXAM: RIGHT LOWER EXTREMITY VENOUS DOPPLER ULTRASOUND TECHNIQUE: Gray-scale sonography with graded compression, as well as color Doppler and duplex ultrasound were performed to evaluate the lower extremity deep venous systems from the level of the  common femoral vein and including the common femoral, femoral, profunda femoral, popliteal and calf veins including the posterior tibial, peroneal and gastrocnemius veins when visible. The superficial great saphenous vein was also interrogated. Spectral Doppler was utilized to evaluate flow at rest and with distal augmentation maneuvers in the common femoral, femoral and popliteal veins. COMPARISON:  None. FINDINGS: Contralateral Common Femoral Vein: Respiratory phasicity is normal and symmetric with the symptomatic side. No evidence of thrombus. Normal compressibility. Common Femoral Vein: No evidence of thrombus. Normal compressibility, respiratory phasicity and response to augmentation. Saphenofemoral Junction: No evidence of thrombus. Normal compressibility and flow on color Doppler imaging. Profunda Femoral Vein: No evidence of thrombus. Normal compressibility and flow on color Doppler imaging. Femoral Vein: No evidence of thrombus. Normal compressibility, respiratory phasicity and response to augmentation. Popliteal Vein: No evidence of thrombus. Normal compressibility, respiratory phasicity and response to augmentation. Calf Veins: Limited visualization of calf veins due to body habitus. No obvious thrombus identified. Superficial Great Saphenous Vein: No evidence of thrombus. Normal compressibility. Venous Reflux:  None. Other Findings: No evidence  of superficial thrombophlebitis or abnormal fluid collection. IMPRESSION: No evidence of right lower extremity deep venous thrombosis. Electronically Signed   By: Aletta Edouard M.D.   On: 12/14/2020 10:21   DG Knee Complete 4 Views Right  Result Date: 12/14/2020 CLINICAL DATA:  Pain and swelling over the last 3 weeks. EXAM: RIGHT KNEE - COMPLETE 4+ VIEW COMPARISON:  None FINDINGS: Osteoarthritis of the medial and lateral weight-bearing compartments with mild joint space narrowing and small marginal osteophytes. No visible joint effusion. No other focal bone finding. IMPRESSION: Mild weight-bearing compartment osteoarthritis.  No acute finding. Electronically Signed   By: Nelson Chimes M.D.   On: 12/14/2020 10:11    Procedures Procedures   Medications Ordered in ED Medications - No data to display  ED Course  I have reviewed the triage vital signs and the nursing notes.  Pertinent labs & imaging results that were available during my care of the patient were reviewed by me and considered in my medical decision making (see chart for details).    MDM Rules/Calculators/A&P                           45 year old female presents emergency department with right knee pain.  Patient has large obese extremities that appear comparable on physical exam.  She is hypertensive on arrival but did not take her morning blood pressure medications.  Ultrasound shows no DVT or Baker's cyst.  X-ray shows weightbearing osteoarthritis which is most likely the cause of the patient's symptoms.  Will treat symptomatically and refer to orthopedics.  Patient will go home and take her scheduled blood pressure medication.  No active chest pain or shortness of breath.  Patient at this time appears safe and stable for discharge and will be treated as an outpatient.  Discharge plan and strict return to ED precautions discussed, patient verbalizes understanding and agreement.   Final Clinical Impression(s) / ED  Diagnoses Final diagnoses:  Right knee pain, unspecified chronicity    Rx / DC Orders ED Discharge Orders          Ordered    HYDROcodone-acetaminophen (NORCO) 5-325 MG tablet  Every 6 hours PRN        12/14/20 1105             Debby Clyne, Alvin Critchley, DO 12/14/20 1114

## 2020-12-29 ENCOUNTER — Other Ambulatory Visit: Payer: Self-pay | Admitting: Registered Nurse

## 2020-12-29 DIAGNOSIS — F5104 Psychophysiologic insomnia: Secondary | ICD-10-CM

## 2020-12-29 DIAGNOSIS — F411 Generalized anxiety disorder: Secondary | ICD-10-CM

## 2021-01-05 ENCOUNTER — Other Ambulatory Visit: Payer: Self-pay | Admitting: Dermatology

## 2021-01-05 DIAGNOSIS — M25561 Pain in right knee: Secondary | ICD-10-CM | POA: Diagnosis not present

## 2021-01-18 DIAGNOSIS — M25561 Pain in right knee: Secondary | ICD-10-CM | POA: Diagnosis not present

## 2021-01-26 DIAGNOSIS — S83231A Complex tear of medial meniscus, current injury, right knee, initial encounter: Secondary | ICD-10-CM | POA: Diagnosis not present

## 2021-02-07 ENCOUNTER — Other Ambulatory Visit: Payer: Self-pay | Admitting: Physician Assistant

## 2021-02-15 ENCOUNTER — Other Ambulatory Visit: Payer: Self-pay | Admitting: Registered Nurse

## 2021-02-15 ENCOUNTER — Other Ambulatory Visit: Payer: Self-pay | Admitting: Dermatology

## 2021-02-15 DIAGNOSIS — F5104 Psychophysiologic insomnia: Secondary | ICD-10-CM

## 2021-02-15 DIAGNOSIS — R21 Rash and other nonspecific skin eruption: Secondary | ICD-10-CM

## 2021-03-02 NOTE — Progress Notes (Signed)
Sent message, via epic in basket, requesting orders in epic from surgeon.  

## 2021-03-03 NOTE — Patient Instructions (Signed)
DUE TO COVID-19 ONLY ONE VISITOR IS ALLOWED TO COME WITH YOU AND STAY IN THE WAITING ROOM ONLY DURING PRE OP AND PROCEDURE.   **NO VISITORS ARE ALLOWED IN THE SHORT STAY AREA OR RECOVERY ROOM!!**  IF YOU WILL BE ADMITTED INTO THE HOSPITAL YOU ARE ALLOWED ONLY TWO SUPPORT PEOPLE DURING VISITATION HOURS ONLY (7 AM -8PM)   The support person(s) must pass our screening, gel in and out, and wear a mask at all times, including in the patients room. Patients must also wear a mask when staff or their support person are in the room. Visitors GUEST BADGE MUST BE WORN VISIBLY  One adult visitor may remain with you overnight and MUST be in the room by 8 P.M.  No visitors under the age of 1. Any visitor under the age of 29 must be accompanied by an adult.        Your procedure is scheduled on: 03/07/20   Report to [facility] Main Entrance    Report to admitting at : 2:00 PM   Call this number if you have problems the morning of surgery [phone]   Do not eat food :After Midnight.   May have liquids until : 1:15 PM   day of surgery  CLEAR LIQUID DIET  Foods Allowed                                                                     Foods Excluded  Water, Black Coffee and tea, regular and decaf                             liquids that you cannot  Plain Jell-O in any flavor  (No red)                                           see through such as: Fruit ices (not with fruit pulp)                                     milk, soups, orange juice              Iced Popsicles (No red)                                    All solid food                                   Apple juices Sports drinks like Gatorade (No red) Lightly seasoned clear broth or consume(fat free) Sugar  Sample Menu Breakfast                                Lunch  Supper Cranberry juice                    Beef broth                            Chicken broth Jell-O                                      Grape juice                           Apple juice Coffee or tea                        Jell-O                                      Popsicle                                                Coffee or tea                        Coffee or tea     Complete one Ensure drink the morning of surgery at : 1:15 PM      the day of surgery.    The day of surgery:  Drink ONE (1) Pre-Surgery Clear Ensure or G2 by am the morning of surgery. Drink in one sitting. Do not sip.  This drink was given to you during your hospital  pre-op appointment visit. Nothing else to drink after completing the  Pre-Surgery Clear Ensure or G2.          If you have questions, please contact your surgeons office.     Oral Hygiene is also important to reduce your risk of infection.                                    Remember - BRUSH YOUR TEETH THE MORNING OF SURGERY WITH YOUR REGULAR TOOTHPASTE   Do NOT smoke after Midnight   Take these medicines the morning of surgery with A SIP OF WATER: amlodipine  DO NOT TAKE ANY ORAL DIABETIC MEDICATIONS DAY OF YOUR SURGERY                              You may not have any metal on your body including hair pins, jewelry, and body piercing             Do not wear make-up, lotions, powders, perfumes/cologne, or deodorant  Do not wear nail polish including gel and S&S, artificial/acrylic nails, or any other type of covering on natural nails including finger and toenails. If you have artificial nails, gel coating, etc. that needs to be removed by a nail salon please have this removed prior to surgery or surgery may need to be canceled/ delayed if the surgeon/ anesthesia feels like they are unable to be safely monitored.   Do not shave  48 hours prior to surgery.   Do not bring valuables to the hospital. [place].   Contacts, dentures or bridgework may not be worn into surgery.   Bring small overnight bag day of  surgery.    Patients discharged on the day of surgery will not be allowed to drive home.  We recommend you have a responsible adult to stay with you for the first 24 hours after anesthesia.   Special Instructions: Bring a copy of your healthcare power of attorney and living will documents         the day of surgery if you haven't scanned them before.              Please read over the following fact sheets you were given: IF YOU HAVE QUESTIONS ABOUT YOUR PRE-OP INSTRUCTIONS PLEASE CALL [phone]     [facility] - Preparing for Surgery Before surgery, you can play an important role.  Because skin is not sterile, your skin needs to be as free of germs as possible.  You can reduce the number of germs on your skin by washing with CHG (chlorahexidine gluconate) soap before surgery.  CHG is an antiseptic cleaner which kills germs and bonds with the skin to continue killing germs even after washing. Please DO NOT use if you have an allergy to CHG or antibacterial soaps.  If your skin becomes reddened/irritated stop using the CHG and inform your nurse when you arrive at Short Stay. Do not shave (including legs and underarms) for at least 48 hours prior to the first CHG shower.  You may shave your face/neck. Please follow these instructions carefully:  1.  Shower with CHG Soap the night before surgery and the  morning of Surgery.  2.  If you choose to wash your hair, wash your hair first as usual with your  normal  shampoo.  3.  After you shampoo, rinse your hair and body thoroughly to remove the  shampoo.                           4.  Use CHG as you would any other liquid soap.  You can apply chg directly  to the skin and wash                       Gently with a scrungie or clean washcloth.  5.  Apply the CHG Soap to your body ONLY FROM THE NECK DOWN.   Do not use on face/ open                           Wound or open sores. Avoid contact with eyes, ears mouth and genitals (private parts).                        Wash face,  Genitals (private parts) with your normal soap.             6.  Wash thoroughly, paying special attention to the area where your surgery  will be performed.  7.  Thoroughly rinse your body with warm water from the neck down.  8.  DO NOT shower/wash with your normal soap after using and rinsing off  the CHG Soap.  9.  Pat yourself dry with a clean towel.            10.  Wear clean pajamas.            11.  Place clean sheets on your bed the night of your first shower and do not  sleep with pets. Day of Surgery : Do not apply any lotions/deodorants the morning of surgery.  Please wear clean clothes to the hospital/surgery center.  FAILURE TO FOLLOW THESE INSTRUCTIONS MAY RESULT IN THE CANCELLATION OF YOUR SURGERY PATIENT SIGNATURE_________________________________  NURSE SIGNATURE__________________________________  ________________________________________________________________________   Rebecca Collier  An incentive spirometer is a tool that can help keep your lungs clear and active. This tool measures how well you are filling your lungs with each breath. Taking long deep breaths may help reverse or decrease the chance of developing breathing (pulmonary) problems (especially infection) following: A long period of time when you are unable to move or be active. BEFORE THE PROCEDURE  If the spirometer includes an indicator to show your best effort, your nurse or respiratory therapist will set it to a desired goal. If possible, sit up straight or lean slightly forward. Try not to slouch. Hold the incentive spirometer in an upright position. INSTRUCTIONS FOR USE  Sit on the edge of your bed if possible, or sit up as far as you can in bed or on a chair. Hold the incentive spirometer in an upright position. Breathe out normally. Place the mouthpiece in your mouth and seal your lips tightly around it. Breathe in slowly and as deeply as possible, raising the  piston or the ball toward the top of the column. Hold your breath for 3-5 seconds or for as long as possible. Allow the piston or ball to fall to the bottom of the column. Remove the mouthpiece from your mouth and breathe out normally. Rest for a few seconds and repeat Steps 1 through 7 at least 10 times every 1-2 hours when you are awake. Take your time and take a few normal breaths between deep breaths. The spirometer may include an indicator to show your best effort. Use the indicator as a goal to work toward during each repetition. After each set of 10 deep breaths, practice coughing to be sure your lungs are clear. If you have an incision (the cut made at the time of surgery), support your incision when coughing by placing a pillow or rolled up towels firmly against it. Once you are able to get out of bed, walk around indoors and cough well. You may stop using the incentive spirometer when instructed by your caregiver.  RISKS AND COMPLICATIONS Take your time so you do not get dizzy or light-headed. If you are in pain, you may need to take or ask for pain medication before doing incentive spirometry. It is harder to take a deep breath if you are having pain. AFTER USE Rest and breathe slowly and easily. It can be helpful to keep track of a log of your progress. Your caregiver can provide you with a simple table to help with this. If you are using the spirometer at home, follow these instructions: [place] IF:  You are having difficultly using the spirometer. You have trouble using the spirometer as often as instructed. Your pain medication is not giving enough relief while using the spirometer. You develop fever of 100.5 F (38.1 C) or higher. SEEK IMMEDIATE MEDICAL CARE IF:  You cough up bloody sputum that had not  been present before. You develop fever of 102 F (38.9 C) or greater. You develop worsening pain at or near the incision site. MAKE SURE YOU:  Understand these  instructions. Will watch your condition. Will get help right away if you are not doing well or get worse. Document Released: 06/26/2006 Document Revised: 05/08/2011 Document Reviewed: 08/27/2006 [facility] Patient Information 2014 [place], [place].   ________________________________________________________________________

## 2021-03-04 ENCOUNTER — Other Ambulatory Visit: Payer: Self-pay

## 2021-03-04 ENCOUNTER — Ambulatory Visit (HOSPITAL_COMMUNITY): Payer: 59 | Admitting: Physician Assistant

## 2021-03-04 ENCOUNTER — Encounter (HOSPITAL_COMMUNITY): Payer: Self-pay

## 2021-03-04 ENCOUNTER — Encounter (HOSPITAL_COMMUNITY)
Admission: RE | Admit: 2021-03-04 | Discharge: 2021-03-04 | Disposition: A | Discharge status: Home / Self Care | Payer: 59 | Source: Ambulatory Visit | Attending: Orthopedic Surgery | Admitting: Orthopedic Surgery

## 2021-03-04 VITALS — BP 198/125 | HR 71 | Temp 98.7°F | Ht 66.0 in | Wt 326.0 lb

## 2021-03-04 DIAGNOSIS — Z01818 Encounter for other preprocedural examination: Secondary | ICD-10-CM | POA: Diagnosis not present

## 2021-03-04 DIAGNOSIS — I10 Essential (primary) hypertension: Secondary | ICD-10-CM | POA: Diagnosis not present

## 2021-03-04 HISTORY — DX: Unspecified osteoarthritis, unspecified site: M19.90

## 2021-03-04 LAB — CBC
HCT: 43 % (ref 36.0–46.0)
Hemoglobin: 14 g/dL (ref 12.0–15.0)
MCH: 28.6 pg (ref 26.0–34.0)
MCHC: 32.6 g/dL (ref 30.0–36.0)
MCV: 87.8 fL (ref 80.0–100.0)
Platelets: 282 10*3/uL (ref 150–400)
RBC: 4.9 MIL/uL (ref 3.87–5.11)
RDW: 13.6 % (ref 11.5–15.5)
WBC: 5.4 10*3/uL (ref 4.0–10.5)
nRBC: 0 % (ref 0.0–0.2)

## 2021-03-04 LAB — BASIC METABOLIC PANEL WITH GFR
Anion gap: 4 — ABNORMAL LOW (ref 5–15)
BUN: 10 mg/dL (ref 6–20)
CO2: 22 mmol/L (ref 22–32)
Calcium: 9.2 mg/dL (ref 8.9–10.3)
Chloride: 109 mmol/L (ref 98–111)
Creatinine, Ser: 0.97 mg/dL (ref 0.44–1.00)
GFR, Estimated: >60 mL/min
Glucose, Bld: 106 mg/dL — ABNORMAL HIGH (ref 70–99)
Potassium: 3.9 mmol/L (ref 3.5–5.1)
Sodium: 135 mmol/L (ref 135–145)

## 2021-03-04 NOTE — Progress Notes (Signed)
Anesthesia Chart Review   Case: [id number] Date/Time: 03/07/21 1600   Procedure: KNEE ARTHROSCOPY WITH PARTIAL MEDIAL MENISECTOMY (Right: Knee) - 60   Anesthesia type: Choice   Pre-op diagnosis: Right knee medial meniscus tear   Location: [facility] ROOM 08 / [facility]   Surgeons: Nicholes Stairs, MD       DISCUSSION:46 y.o. never smoker with h/o HTN, right knee medial meniscus tear scheduled for above procedure 03/07/2021 with Dr. Nicholes Stairs.   BP Readings from Last 3 Encounters:  03/04/21 (!) 198/125  12/14/20 (!) 162/101  01/02/20 (!) 143/93   Elevated BP at PAT visit, pt asymptomatic.  She reports she took her amlodipine 2m this morning.  Advised to contact PCP for medication adjustments prior to surgery.  Discussed risk of same day cancellation.    Discussed with Dr. RStann Mainlandoffice.  Pt needs to be evaluated by PCP prior to surgery for poorly controlled HTN.  VS: LMP 03/01/2021   PROVIDERS: MMaximiano Coss NP is PCP    LABS:  labs pending (all labs ordered are listed, but only abnormal results are displayed)  Labs Reviewed - No data to display   IMAGES:   EKG: 03/04/2021 Rate 71 bpm  Normal sinus rhythm Moderate voltage criteria for LVH, may be normal variant ( R in aVL , [employer] product )   CV:  Past Medical History:  Diagnosis Date   Allergy    Anxiety    Arthritis    Eczema    Hypertension    Obesity    Sciatica    spasms daily in left leg     Past Surgical History:  Procedure Laterality Date   NO PAST SURGERIES     WISDOM TOOTH EXTRACTION      MEDICATIONS: No current facility-administered medications for this encounter.    albuterol (VENTOLIN HFA) 108 (90 Base) MCG/ACT inhaler   amLODipine (NORVASC) 10 MG tablet   BLACK CURRANT SEED OIL PO   buPROPion (WELLBUTRIN XL) 300 MG 24 hr tablet   Cholecalciferol (VITAMIN D3 PO)   ibuprofen (ADVIL) 200 MG tablet   OVER THE COUNTER MEDICATION   traZODone (DESYREL) 50 MG tablet   triamcinolone  cream (KENALOG) 0.1 %   vitamin B-12 (CYANOCOBALAMIN) 1000 MCG tablet   vitamin E 180 MG (400 UNITS) capsule   Clobetasol Prop Emollient Base 0.05 % emollient cream   DUPIXENT 300 MG/2ML SOPN   hydrOXYzine (ATARAX/VISTARIL) 25 MG tablet   QUEtiapine (SEROQUEL) 25 MG tablet     JBig Island Endoscopy CenterWard, PA-C [facility] Pre-Surgical Testing ([phone]

## 2021-03-04 NOTE — Progress Notes (Addendum)
COVID Vaccine Completed: Yes Date COVID Vaccine completed: 07/13/19 x 2 COVID vaccine manufacturer:  Moderna    COVID Test: N/A  PCP -  Maximiano Coss: NP. LOV: 01/01/21 Cardiologist -   Chest x-ray -  EKG -  Stress Test -  ECHO -  Cardiac Cath -  Pacemaker/ICD device last checked:  Sleep Study -  CPAP -   Fasting Blood Sugar -  Checks Blood Sugar _____ times a day  Blood Thinner Instructions: Aspirin Instructions: Last Dose:  Anesthesia review: Hx: HTN. BP during PAT appointment was elevated : 182/127,198/125. Janett Billow [facility] is aware.  Patient denies shortness of breath, fever, cough and chest pain at PAT appointment   Patient verbalized understanding of instructions that were given to them at the PAT appointment. Patient was also instructed that they will need to review over the PAT instructions again at home before surgery.

## 2021-03-04 NOTE — Progress Notes (Signed)
Pt. BP was elevated ( 182/127, 198/125),pt. Was advise to call PCP and informed him about BP results.Pt. was instructed, that if she's unable to get in contact with PCP she needs to go to urgent care.Also if she develops any symptoms like headache,chest pain or blurred vision to go to ED.Pt. and her mother verbalized their understanding of the instructions.

## 2021-03-07 ENCOUNTER — Other Ambulatory Visit: Payer: Self-pay | Admitting: Physician Assistant

## 2021-03-07 ENCOUNTER — Ambulatory Visit (HOSPITAL_COMMUNITY)
Admission: RE | Admit: 2021-03-07 | Discharge: 2021-03-07 | Disposition: A | Discharge status: Home / Self Care | Payer: 59 | Attending: Orthopedic Surgery | Admitting: Orthopedic Surgery

## 2021-03-07 ENCOUNTER — Encounter (HOSPITAL_COMMUNITY): Payer: Self-pay | Admitting: Orthopedic Surgery

## 2021-03-07 ENCOUNTER — Encounter (HOSPITAL_COMMUNITY)
Admission: RE | Disposition: A | Discharge status: Home / Self Care | Payer: Self-pay | Source: Home / Self Care | Attending: Orthopedic Surgery

## 2021-03-07 DIAGNOSIS — Z01818 Encounter for other preprocedural examination: Secondary | ICD-10-CM | POA: Diagnosis not present

## 2021-03-07 DIAGNOSIS — I10 Essential (primary) hypertension: Secondary | ICD-10-CM | POA: Insufficient documentation

## 2021-03-07 DIAGNOSIS — S83241A Other tear of medial meniscus, current injury, right knee, initial encounter: Secondary | ICD-10-CM | POA: Diagnosis not present

## 2021-03-07 DIAGNOSIS — Z538 Procedure and treatment not carried out for other reasons: Secondary | ICD-10-CM | POA: Diagnosis not present

## 2021-03-07 DIAGNOSIS — U071 COVID-19: Secondary | ICD-10-CM | POA: Diagnosis not present

## 2021-03-07 DIAGNOSIS — X58XXXA Exposure to other specified factors, initial encounter: Secondary | ICD-10-CM | POA: Diagnosis not present

## 2021-03-07 LAB — PREGNANCY, URINE: Preg Test, Ur: NEGATIVE

## 2021-03-07 LAB — RESP PANEL BY RT-PCR (FLU A&B, COVID) ARPGX2
Influenza A by PCR: NEGATIVE
Influenza B by PCR: NEGATIVE
SARS Coronavirus 2 by RT PCR: POSITIVE — AB

## 2021-03-07 SURGERY — ARTHROSCOPY, KNEE, WITH MEDIAL MENISCECTOMY
Anesthesia: General | Site: Knee | Laterality: Right

## 2021-03-07 MED ORDER — ORAL CARE MOUTH RINSE: 15.0000 mL | Freq: Once | OROMUCOSAL | Status: AC

## 2021-03-07 MED ORDER — CEFAZOLIN IN SODIUM CHLORIDE 3-0.9 GM/100ML-% IV SOLN
3.0000 g | INTRAVENOUS | Status: DC
Start: 2021-03-07 — End: 2021-03-07
  Filled 2021-03-07: qty 100

## 2021-03-07 MED ORDER — LACTATED RINGERS IV SOLN: INTRAVENOUS | Status: DC

## 2021-03-07 MED ORDER — CHLORHEXIDINE GLUCONATE 0.12 % MT SOLN
15.0000 mL | Freq: Once | OROMUCOSAL | Status: AC
  Administered 2021-03-07: 15 mL via OROMUCOSAL

## 2021-03-07 MED ORDER — ACETAMINOPHEN 500 MG PO TABS
1000.0000 mg | ORAL_TABLET | Freq: Once | ORAL | Status: AC
  Administered 2021-03-07: 1000 mg via ORAL
  Filled 2021-03-07: qty 2

## 2021-03-07 SURGICAL SUPPLY — 32 items
BAG COUNTER SPONGE SURGICOUNT (BAG) ×2 IMPLANT
BLADE SHAVER TORPEDO 4X13 (MISCELLANEOUS) ×2 IMPLANT
BNDG ELASTIC 6X5.8 VLCR STR LF (GAUZE/BANDAGES/DRESSINGS) ×2 IMPLANT
COVER SURGICAL LIGHT HANDLE (MISCELLANEOUS) ×2 IMPLANT
CUFF TOURN SGL QUICK 34 (TOURNIQUET CUFF)
CUFF TRNQT CYL 34X4.125X (TOURNIQUET CUFF) IMPLANT
DRAPE ARTHROSCOPY W/POUCH 114 (DRAPES) ×2 IMPLANT
DRAPE SHEET LG 3/4 BI-LAMINATE (DRAPES) IMPLANT
DRAPE U-SHAPE 47X51 STRL (DRAPES) ×2 IMPLANT
DRSG PAD ABDOMINAL 8X10 ST (GAUZE/BANDAGES/DRESSINGS) ×4 IMPLANT
DURAPREP 26ML APPLICATOR (WOUND CARE) ×2 IMPLANT
GAUZE 4X4 16PLY ~~LOC~~+RFID DBL (SPONGE) ×2 IMPLANT
GAUZE SPONGE 4X4 12PLY STRL (GAUZE/BANDAGES/DRESSINGS) ×2 IMPLANT
GLOVE SRG 8 PF TXTR STRL LF DI (GLOVE) ×2 IMPLANT
GLOVE SURG ENC MOIS LTX SZ7.5 (GLOVE) ×4 IMPLANT
GLOVE SURG UNDER POLY LF SZ8 (GLOVE) ×2
GOWN STRL REUS W/TWL XL LVL3 (GOWN DISPOSABLE) ×4 IMPLANT
IV NS IRRIG 3000ML ARTHROMATIC (IV SOLUTION) ×4 IMPLANT
KIT BASIN OR (CUSTOM PROCEDURE TRAY) ×2 IMPLANT
KIT TURNOVER KIT A (KITS) IMPLANT
MANIFOLD NEPTUNE II (INSTRUMENTS) ×2 IMPLANT
PACK ARTHROSCOPY WL (CUSTOM PROCEDURE TRAY) ×2 IMPLANT
PORT APPOLLO RF 90DEGREE MULTI (SURGICAL WAND) IMPLANT
PROBE HOOK APOLLO (SURGICAL WAND) IMPLANT
STRIP CLOSURE SKIN 1/2X4 (GAUZE/BANDAGES/DRESSINGS) ×2 IMPLANT
SUT ETHILON 4 0 PS 2 18 (SUTURE) IMPLANT
SUT MNCRL AB 3-0 PS2 18 (SUTURE) ×2 IMPLANT
TOWEL OR 17X26 10 PK STRL BLUE (TOWEL DISPOSABLE) ×2 IMPLANT
TUBING ARTHROSCOPY IRRIG 16FT (MISCELLANEOUS) ×2 IMPLANT
WAND APOLLORF SJ50 AR-9845 (SURGICAL WAND) IMPLANT
WATER STERILE IRR 500ML POUR (IV SOLUTION) ×2 IMPLANT
WRAP KNEE MAXI GEL POST OP (GAUZE/BANDAGES/DRESSINGS) ×2 IMPLANT

## 2021-03-07 NOTE — Anesthesia Preprocedure Evaluation (Deleted)
Anesthesia Evaluation  Patient identified by MRN, date of birth, ID band Patient awake    Reviewed: Allergy & Precautions, NPO status , Patient's Chart, lab work & pertinent test results  Airway Mallampati: II       Dental  (+) Dental Advisory Given   Pulmonary neg pulmonary ROS,           Cardiovascular hypertension,  Rhythm:Regular Rate:Tachycardia     Neuro/Psych  Headaches, PSYCHIATRIC DISORDERS Anxiety  Neuromuscular disease    GI/Hepatic negative GI ROS, Neg liver ROS,   Endo/Other  Morbid obesity  Renal/GU negative Renal ROS     Musculoskeletal  (+) Arthritis ,   Abdominal (+) + obese,   Peds  Hematology negative hematology ROS (+)   Anesthesia Other Findings   Reproductive/Obstetrics                            Anesthesia Physical Anesthesia Plan  ASA: 3  Anesthesia Plan: General   Post-op Pain Management: Minimal or no pain anticipated and Tylenol PO (pre-op)   Induction: Intravenous  PONV Risk Score and Plan: 4 or greater and Treatment may vary due to age or medical condition, Ondansetron, Dexamethasone and Midazolam  Airway Management Planned: LMA  Additional Equipment: None  Intra-op Plan:   Post-operative Plan: Extubation in OR  Informed Consent: I have reviewed the patients History and Physical, chart, labs and discussed the procedure including the risks, benefits and alternatives for the proposed anesthesia with the patient or authorized representative who has indicated his/her understanding and acceptance.     Dental advisory given  Plan Discussed with: CRNA  Anesthesia Plan Comments: (Pt febrile in preop today. Sick contact at home will discuss with Dr. Stann Mainland. Temp 100.7. Dr. Stann Mainland request covid testing. I concur with testing, but will do a viral panel as this could be influenza and elective surgery in that setting is not best practice in my opinion.   I  spoke to the patient, she obviously feels unwell. Her mother has URI symptoms at home. I discussed that surgery likely should be delayed, but that I would talk to Dr. Stann Mainland.   Patient was in favor of going home and rescheduling surgery.   Covid+ on swab. I spoke to the patient and recommended she f/u with her PCP to discuss possible paxlovid or other treatments. She expressed understanding.  )    Anesthesia Quick Evaluation

## 2021-03-08 ENCOUNTER — Telehealth (INDEPENDENT_AMBULATORY_CARE_PROVIDER_SITE_OTHER): Payer: 59 | Admitting: Registered Nurse

## 2021-03-08 DIAGNOSIS — U071 COVID-19: Secondary | ICD-10-CM

## 2021-03-08 MED ORDER — NIRMATRELVIR/RITONAVIR (PAXLOVID)TABLET: 3.0000 | ORAL_TABLET | Freq: Two times a day (BID) | ORAL | 0 refills | Status: AC

## 2021-03-08 MED ORDER — AZELASTINE HCL 0.1 % NA SOLN: 1.0000 | Freq: Two times a day (BID) | NASAL | 12 refills | Status: DC

## 2021-03-08 MED ORDER — ALBUTEROL SULFATE HFA 108 (90 BASE) MCG/ACT IN AERS: 2.0000 | INHALATION_SPRAY | RESPIRATORY_TRACT | 2 refills | Status: DC | PRN

## 2021-03-08 MED ORDER — DM-GUAIFENESIN ER 30-600 MG PO TB12: 1.0000 | ORAL_TABLET | Freq: Two times a day (BID) | ORAL | 0 refills | Status: DC

## 2021-03-08 NOTE — Progress Notes (Signed)
Telemedicine Encounter- SOAP NOTE Established Patient  This video encounter was conducted with the patient's (or proxy's) verbal consent via audio telecommunications: yes/no: Yes Patient was instructed to have this encounter in a suitably private space; and to only have persons present to whom they give permission to participate. In addition, patient identity was confirmed by use of name plus two identifiers (DOB and address).  I discussed the limitations, risks, security and privacy concerns of performing an evaluation and management service by telephone and the availability of in person appointments. I also discussed with the patient that there may be a patient responsible charge related to this service. The patient expressed understanding and agreed to proceed.  I spent a total of 13 mintues talking with the patient or their proxy.  Patient at home Provider in office  Participants: [name], NP and Esther Hardy  No chief complaint on file.   Subjective   Rebecca Collier is a 46 y.o. established patient. Video visit today for COVID+  HPI Symptoms onset late Sunday into Monday. Tested positive on Monday with preop testing  Unfortunately had to delay knee surgery.   Notes fever, malaise, aches, headache, nausea, abdominal pain, some dizziness. Some diarrhea.   Notes temp of 101.44F at hospital.   Has taken theraflu and tylenol - no relief.   Patient Active Problem List   Diagnosis Date Noted   Elevated blood sugar 05/21/2019   Vitamin D deficiency 05/06/2019   Well female exam with routine gynecological exam 04/23/2019   Class 3 severe obesity with body mass index (BMI) of 50.0 to 59.9 in adult [facility] 04/23/2019   Psychophysiological insomnia 04/23/2019   GAD (generalized anxiety disorder) 04/23/2019   Chronic migraine without aura without status migrainosus, not intractable 04/23/2019   Eczema 02/06/2013   Essential hypertension, benign 02/06/2013   Family history of  colon cancer in mother (age 86) 02/06/2013    Past Medical History:  Diagnosis Date   Allergy    Anxiety    Arthritis    Eczema    Hypertension    Obesity    Sciatica    spasms daily in left leg     Current Outpatient Medications  Medication Sig Dispense Refill   azelastine (ASTELIN) 0.1 % nasal spray Place 1 spray into both nostrils 2 (two) times daily. Use in each nostril as directed 30 mL 12   dextromethorphan-guaiFENesin (MUCINEX DM) 30-600 MG 12hr tablet Take 1 tablet by mouth 2 (two) times daily. 20 tablet 0   nirmatrelvir/ritonavir EUA (PAXLOVID) 20 x 150 MG & 10 x 100MG TABS Take 3 tablets by mouth 2 (two) times daily for 5 days. (Take nirmatrelvir 150 mg two tablets twice daily for 5 days and ritonavir 100 mg one tablet twice daily for 5 days) Patient GFR is >60 30 tablet 0   albuterol (VENTOLIN HFA) 108 (90 Base) MCG/ACT inhaler Inhale 2 puffs into the lungs every 4 (four) hours as needed for wheezing or shortness of breath. 18 g 2   amLODipine (NORVASC) 10 MG tablet Take 1 tablet (10 mg total) by mouth daily. 90 tablet 1   BLACK CURRANT SEED OIL PO Take 1,300 mg by mouth 2 (two) times daily.     buPROPion (WELLBUTRIN XL) 300 MG 24 hr tablet Take 1 tablet (300 mg total) by mouth daily. (Patient taking differently: Take 300 mg by mouth at bedtime as needed (Calm dowm).) 90 tablet 1   Cholecalciferol (VITAMIN D3 PO) Take 1 tablet by mouth  daily.     Clobetasol Prop Emollient Base 0.05 % emollient cream Apply to affected area bid 60 g 2   DUPIXENT 300 MG/2ML SOPN INJECT 1 PEN UNDER THE SKIN EVERY OTHER WEEK 2 mL 0   hydrOXYzine (ATARAX/VISTARIL) 25 MG tablet 1 to 2 tabs q 6 hours prn for itching (Patient not taking: Reported on 03/02/2021) 30 tablet 0   ibuprofen (ADVIL) 200 MG tablet Take 600 mg by mouth every 6 (six) hours as needed for moderate pain.     OVER THE COUNTER MEDICATION Take 450 mg by mouth daily. Burdock Root     QUEtiapine (SEROQUEL) 25 MG tablet TAKE 1 TABLET BY  MOUTH EVERYDAY AT BEDTIME (Patient not taking: Reported on 03/02/2021) 90 tablet 1   traZODone (DESYREL) 50 MG tablet TAKE 1/2 TO 1 TABLET AT BEDTIME AS NEEDED FOR SLEEP (Patient taking differently: Take 50 mg by mouth at bedtime as needed for sleep.) 90 tablet 2   triamcinolone cream (KENALOG) 0.1 % Apply 1 application topically daily as needed. (Patient taking differently: Apply 1 application topically daily as needed (Eczema).) 453 g 2   vitamin B-12 (CYANOCOBALAMIN) 1000 MCG tablet Take 1,000 mcg by mouth daily.     vitamin E 180 MG (400 UNITS) capsule Take 400 Units by mouth daily.     No current facility-administered medications for this visit.    Allergies  Allergen Reactions   Lisinopril Cough   Hydrochlorothiazide Nausea And Vomiting   Penicillins Rash    Social History   Socioeconomic History   Marital status: Single    Spouse name: Not on file   Number of children: Not on file   Years of education: Not on file   Highest education level: Not on file  Occupational History   Not on file  Tobacco Use   Smoking status: Never   Smokeless tobacco: Never  Vaping Use   Vaping Use: Never used  Substance and Sexual Activity   Alcohol use: Yes    Comment: socially   Drug use: No   Sexual activity: Not on file  Other Topics Concern   Not on file  Social History Narrative   Work or School: Quarry manager, Physicist, 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      Home Situation: lives with mother      Spiritual Beliefs: none      Lifestyle: no regular exercise; diet is ok         Social Determinants of Radio broadcast assistant Strain: Not on file  Food Insecurity: Not on file  Transportation Needs: Not on file  Physical Activity: Not on file  Stress: Not on file  Social Connections: Not on file  Intimate Partner Violence: Not on file    ROS Per hpi   Objective   Vitals as reported by the patient: There were no vitals filed for this visit.  Diagnoses and all orders for this  visit:  COVID-19 -     nirmatrelvir/ritonavir EUA (PAXLOVID) 20 x 150 MG & 10 x 100MG TABS; Take 3 tablets by mouth 2 (two) times daily for 5 days. (Take nirmatrelvir 150 mg two tablets twice daily for 5 days and ritonavir 100 mg one tablet twice daily for 5 days) Patient GFR is >60 -     albuterol (VENTOLIN HFA) 108 (90 Base) MCG/ACT inhaler; Inhale 2 puffs into the lungs every 4 (four) hours as needed for wheezing or shortness of breath. -     dextromethorphan-guaiFENesin (Olivette DM) 30-600 MG  12hr tablet; Take 1 tablet by mouth 2 (two) times daily. -     azelastine (ASTELIN) 0.1 % nasal spray; Place 1 spray into both nostrils 2 (two) times daily. Use in each nostril as directed    PLAN After reviewing risks, benefits, and AE of antivirals, will pursue as above. Supportive care with mucinex, tylenol, rest, and hydration Return if worsening or failing to improve Reviewed ER and isolation precautions with patient who voices understanding Patient encouraged to call clinic with any questions, comments, or concerns.  I discussed the assessment and treatment plan with the patient. The patient was provided an opportunity to ask questions and all were answered. The patient agreed with the plan and demonstrated an understanding of the instructions.   The patient was advised to call back or seek an in-person evaluation if the symptoms worsen or if the condition fails to improve as anticipated.  I provided 15 minutes of face-to-face time during this encounter.  Maximiano Coss, NP

## 2021-03-11 ENCOUNTER — Other Ambulatory Visit: Payer: Self-pay | Admitting: Registered Nurse

## 2021-03-11 ENCOUNTER — Encounter: Payer: Self-pay | Admitting: Registered Nurse

## 2021-03-11 DIAGNOSIS — T7840XA Allergy, unspecified, initial encounter: Secondary | ICD-10-CM

## 2021-03-11 MED ORDER — PREDNISONE 10 MG (21) PO TBPK: ORAL_TABLET | ORAL | 0 refills | Status: DC

## 2021-03-11 NOTE — Telephone Encounter (Signed)
Pt has broken into rash and is itchy after starting paxlovid   Pt is asking about alternative to paxlovid and if prednisone could replace it

## 2021-03-11 NOTE — Telephone Encounter (Signed)
FYI from pt regarding sxs

## 2021-03-14 ENCOUNTER — Other Ambulatory Visit: Payer: Self-pay | Admitting: Registered Nurse

## 2021-03-14 DIAGNOSIS — F5104 Psychophysiologic insomnia: Secondary | ICD-10-CM

## 2021-03-21 ENCOUNTER — Telehealth: Payer: Self-pay | Admitting: Registered Nurse

## 2021-03-21 ENCOUNTER — Other Ambulatory Visit: Payer: Self-pay

## 2021-03-21 ENCOUNTER — Emergency Department (HOSPITAL_COMMUNITY)
Admission: EM | Admit: 2021-03-21 | Discharge: 2021-03-21 | Disposition: A | Discharge status: Home / Self Care | Payer: 59 | Attending: Emergency Medicine | Admitting: Emergency Medicine

## 2021-03-21 ENCOUNTER — Emergency Department (HOSPITAL_COMMUNITY): Payer: 59

## 2021-03-21 ENCOUNTER — Ambulatory Visit: Payer: 59 | Admitting: Family Medicine

## 2021-03-21 ENCOUNTER — Encounter (HOSPITAL_COMMUNITY): Payer: Self-pay

## 2021-03-21 DIAGNOSIS — Z7952 Long term (current) use of systemic steroids: Secondary | ICD-10-CM | POA: Diagnosis not present

## 2021-03-21 DIAGNOSIS — R5383 Other fatigue: Secondary | ICD-10-CM | POA: Diagnosis not present

## 2021-03-21 DIAGNOSIS — Z79899 Other long term (current) drug therapy: Secondary | ICD-10-CM | POA: Diagnosis not present

## 2021-03-21 DIAGNOSIS — Z8616 Personal history of COVID-19: Secondary | ICD-10-CM | POA: Diagnosis not present

## 2021-03-21 DIAGNOSIS — R079 Chest pain, unspecified: Secondary | ICD-10-CM | POA: Insufficient documentation

## 2021-03-21 DIAGNOSIS — R0602 Shortness of breath: Secondary | ICD-10-CM | POA: Diagnosis not present

## 2021-03-21 DIAGNOSIS — R059 Cough, unspecified: Secondary | ICD-10-CM | POA: Insufficient documentation

## 2021-03-21 LAB — TROPONIN I (HIGH SENSITIVITY)
Troponin I (High Sensitivity): 3 ng/L (ref <18)
Troponin I (High Sensitivity): 3 ng/L (ref <18)

## 2021-03-21 LAB — CBC
HCT: 41.4 % (ref 36.0–46.0)
Hemoglobin: 13.3 g/dL (ref 12.0–15.0)
MCH: 28.5 pg (ref 26.0–34.0)
MCHC: 32.1 g/dL (ref 30.0–36.0)
MCV: 88.7 fL (ref 80.0–100.0)
Platelets: 301 10*3/uL (ref 150–400)
RBC: 4.67 MIL/uL (ref 3.87–5.11)
RDW: 13.8 % (ref 11.5–15.5)
WBC: 7.5 10*3/uL (ref 4.0–10.5)
nRBC: 0 % (ref 0.0–0.2)

## 2021-03-21 LAB — BASIC METABOLIC PANEL
Anion gap: 5 (ref 5–15)
BUN: 11 mg/dL (ref 6–20)
CO2: 27 mmol/L (ref 22–32)
Calcium: 9.5 mg/dL (ref 8.9–10.3)
Chloride: 105 mmol/L (ref 98–111)
Creatinine, Ser: 0.68 mg/dL (ref 0.44–1.00)
GFR, Estimated: >60 mL/min (ref >60)
Glucose, Bld: 107 mg/dL — ABNORMAL HIGH (ref 70–99)
Potassium: 4.4 mmol/L (ref 3.5–5.1)
Sodium: 137 mmol/L (ref 135–145)

## 2021-03-21 LAB — D-DIMER, QUANTITATIVE: D-Dimer, Quant: 1.66 ug/mL-FEU — ABNORMAL HIGH (ref 0.00–0.50)

## 2021-03-21 LAB — I-STAT BETA HCG BLOOD, ED (MC, WL, AP ONLY): I-stat hCG, quantitative: <5 m[IU]/mL (ref <5)

## 2021-03-21 MED ORDER — IOHEXOL 350 MG/ML SOLN
80.0000 mL | Freq: Once | INTRAVENOUS | Status: AC | PRN
  Administered 2021-03-21: 80 mL via INTRAVENOUS

## 2021-03-21 MED ORDER — SODIUM CHLORIDE (PF) 0.9 % IJ SOLN
INTRAMUSCULAR | Status: AC
  Filled 2021-03-21: qty 50

## 2021-03-21 NOTE — ED Provider Notes (Signed)
[facility] DEPT Provider Note   CSN: [id number] Arrival date & time: 03/21/21  6160     History  Chief Complaint  Patient presents with   Shortness of Breath   Chest Pain    Rebecca Collier is a 46 y.o. female.  46 yo F with a chief complaints of shortness of breath.  This been going on for about a week or so.  She feels tight across her chest and back.  Worse when she exerts herself going up stairs walking down the street.  She has had some trouble working due to the dyspnea.  She recently is getting over [place].  Was diagnosed about 12 days ago.  She was treated with Paxilovid and steroids.  She feels from that standpoint she is got a bit better but feels like the difficulty breathing has gotten worse.  Has been using an inhaler without improvement.  The history is provided by the patient.  Shortness of Breath Associated symptoms: chest pain and cough   Associated symptoms: no fever, no headaches, no vomiting and no wheezing   Chest Pain Associated symptoms: cough, fatigue and shortness of breath   Associated symptoms: no dizziness, no fever, no headache, no nausea, no palpitations and no vomiting   Illness Severity:  Moderate Onset quality:  Gradual Duration:  21 days Timing:  Constant Progression:  Worsening Chronicity:  New Associated symptoms: chest pain, cough, fatigue and shortness of breath   Associated symptoms: no congestion, no fever, no headaches, no myalgias, no nausea, no rhinorrhea, no vomiting and no wheezing       Home Medications Prior to Admission medications   Medication Sig Start Date End Date Taking? Authorizing Provider  albuterol (VENTOLIN HFA) 108 (90 Base) MCG/ACT inhaler Inhale 2 puffs into the lungs every 4 (four) hours as needed for wheezing or shortness of breath. 03/08/21   Maximiano Coss, NP  amLODipine (NORVASC) 10 MG tablet Take 1 tablet (10 mg total) by mouth daily. 03/08/20   Maximiano Coss, NP  azelastine  (ASTELIN) 0.1 % nasal spray Place 1 spray into both nostrils 2 (two) times daily. Use in each nostril as directed 03/08/21   Maximiano Coss, NP  BLACK CURRANT SEED OIL PO Take 1,300 mg by mouth 2 (two) times daily.    [provider]  buPROPion (WELLBUTRIN XL) 300 MG 24 hr tablet Take 1 tablet (300 mg total) by mouth daily. Patient taking differently: Take 300 mg by mouth at bedtime as needed (Calm dowm). 01/02/20   Maximiano Coss, NP  Cholecalciferol (VITAMIN D3 PO) Take 1 tablet by mouth daily.    [provider]  Clobetasol Prop Emollient Base 0.05 % emollient cream Apply to affected area bid 04/23/19   Wendall Mola, NP  dextromethorphan-guaiFENesin [facility] DM) 30-600 MG 12hr tablet Take 1 tablet by mouth 2 (two) times daily. 03/08/21   Maximiano Coss, NP  DUPIXENT 300 MG/2ML SOPN INJECT 1 PEN UNDER THE SKIN EVERY OTHER WEEK 08/23/20   Robyne Askew R, PA-C  hydrOXYzine (ATARAX/VISTARIL) 25 MG tablet 1 to 2 tabs q 6 hours prn for itching Patient not taking: Reported on 03/02/2021 04/23/19   Wendall Mola, NP  ibuprofen (ADVIL) 200 MG tablet Take 600 mg by mouth every 6 (six) hours as needed for moderate pain.    [provider]  OVER THE COUNTER MEDICATION Take 450 mg by mouth daily. Burdock Root    [provider]  predniSONE (STERAPRED UNI-PAK 21 TAB) 10 MG (  21) TBPK tablet Take per package instructions. Do not skip doses. Finish entire supply. 03/11/21   Maximiano Coss, NP  QUEtiapine (SEROQUEL) 25 MG tablet TAKE 1 TABLET BY MOUTH EVERYDAY AT BEDTIME Patient not taking: Reported on 03/02/2021 12/29/20   Maximiano Coss, NP  traZODone (DESYREL) 50 MG tablet TAKE 1/2 TO 1 TABLET AT BEDTIME AS NEEDED FOR SLEEP Patient taking differently: Take 50 mg by mouth at bedtime as needed for sleep. 01/02/20   Maximiano Coss, NP  triamcinolone cream (KENALOG) 0.1 % Apply 1 application topically daily as needed. Patient taking differently: Apply 1 application  topically daily as needed (Eczema). 07/10/19   Clark-Burning, Anderson Malta, PA-C  vitamin B-12 (CYANOCOBALAMIN) 1000 MCG tablet Take 1,000 mcg by mouth daily.    [provider]  vitamin E 180 MG (400 UNITS) capsule Take 400 Units by mouth daily.    [provider]      Allergies    Lisinopril, Hydrochlorothiazide, and Penicillins    Review of Systems   Review of Systems  Constitutional:  Positive for fatigue. Negative for chills and fever.  HENT:  Negative for congestion and rhinorrhea.   Eyes:  Negative for redness and visual disturbance.  Respiratory:  Positive for cough and shortness of breath. Negative for wheezing.   Cardiovascular:  Positive for chest pain. Negative for palpitations.  Gastrointestinal:  Negative for nausea and vomiting.  Genitourinary:  Negative for dysuria and urgency.  Musculoskeletal:  Negative for arthralgias and myalgias.  Skin:  Negative for pallor and wound.  Neurological:  Negative for dizziness and headaches.   Physical Exam Updated Vital Signs BP (!) 170/110    Pulse 71    Temp 98.5 F (36.9 C) (Oral)    Resp (!) 21    LMP 03/04/2021 (Exact Date)    SpO2 100%  Physical Exam Vitals and nursing note reviewed.  Constitutional:      General: She is not in acute distress.    Appearance: She is well-developed. She is not diaphoretic.     Comments: BMI 52  HENT:     Head: Normocephalic and atraumatic.  Eyes:     Pupils: Pupils are equal, round, and reactive to light.  Cardiovascular:     Rate and Rhythm: Normal rate and regular rhythm.     Heart sounds: No murmur heard.   No friction rub. No gallop.  Pulmonary:     Effort: Pulmonary effort is normal.     Breath sounds: No wheezing or rales.  Abdominal:     General: There is no distension.     Palpations: Abdomen is soft.     Tenderness: There is no abdominal tenderness.  Musculoskeletal:        General: No tenderness.     Cervical back: Normal range of motion and neck supple.   Skin:    General: Skin is warm and dry.  Neurological:     Mental Status: She is alert and oriented to person, place, and time.  Psychiatric:        Behavior: Behavior normal.    ED Results / Procedures / Treatments   Labs (all labs ordered are listed, but only abnormal results are displayed) Labs Reviewed  BASIC METABOLIC PANEL - Abnormal; Notable for the following components:      Result Value   Glucose, Bld 107 (*)    All other components within normal limits  D-DIMER, QUANTITATIVE - Abnormal; Notable for the following components:   D-Dimer, Quant 1.66 (*)  All other components within normal limits  CBC  I-STAT BETA HCG BLOOD, ED ([facility], [facility], [facility] ONLY)  TROPONIN I (HIGH SENSITIVITY)  TROPONIN I (HIGH SENSITIVITY)    EKG EKG Interpretation  Date/Time:  Monday March 21 2021 09:47:44 EST Ventricular Rate:  99 PR Interval:  144 QRS Duration: 85 QT Interval:  333 QTC Calculation: 428 R Axis:   1 Text Interpretation: Sinus rhythm No significant change since last tracing Confirmed by Blanchie Dessert [phone]) on 03/21/2021 10:04:25 AM  Radiology DG Chest 2 View  Result Date: 03/21/2021 CLINICAL DATA:  Shortness of breath, left-sided chest pain and tightness. COVID infection earlier this month. EXAM: CHEST - 2 VIEW COMPARISON:  10/07/2014. FINDINGS: Trachea is midline. Heart size normal. Lungs are clear. No pleural fluid. IMPRESSION: No acute findings. Electronically Signed   By: Lorin Picket M.D.   On: 03/21/2021 11:31   CT Angio Chest PE W and/or Wo Contrast  Result Date: 03/21/2021 CLINICAL DATA:  Shortness of breath, chest pain. Pulmonary embolism (PE) suspected, positive D-dimer EXAM: CT ANGIOGRAPHY CHEST WITH CONTRAST TECHNIQUE: Multidetector CT imaging of the chest was performed using the standard protocol during bolus administration of intravenous contrast. Multiplanar CT image reconstructions and MIPs were obtained to evaluate the vascular anatomy. RADIATION DOSE  REDUCTION: This exam was performed according to the departmental dose-optimization program which includes automated exposure control, adjustment of the mA and/or kV according to patient size and/or use of iterative reconstruction technique. CONTRAST:  93m OMNIPAQUE IOHEXOL 350 MG/ML SOLN COMPARISON:  Same day chest x-ray FINDINGS: Cardiovascular: Adequate contrast bolus timing. Evaluation of the segmental and subsegmental pulmonary arterial branches is limited secondary to beam hardening artifact related to patient habitus and respiratory motion artifact. No filling defect is identified to the lobar branch level. Thoracic aorta is normal in course and caliber. Heart size is normal. No pericardial effusion. Mediastinum/Nodes: No enlarged mediastinal, hilar, or axillary lymph nodes. Thyroid gland, trachea, and esophagus demonstrate no significant findings. Lungs/Pleura: Lungs are clear. No pleural effusion or pneumothorax. Upper Abdomen: No acute abnormality. Musculoskeletal: No chest wall abnormality. Multilevel endplate osteophytes within the mid to lower thoracic spine. No acute or significant osseous findings. Review of the MIP images confirms the above findings. IMPRESSION: 1. Slightly limited exam. No PE is identified to the lobar branch level. 2. Lungs are clear. Electronically Signed   By: NDavina PokeD.O.   On: 03/21/2021 14:11    Procedures Procedures    Medications Ordered in ED Medications  iohexol (OMNIPAQUE) 350 MG/ML injection 80 mL (80 mLs Intravenous Contrast Given 03/21/21 1329)  sodium chloride (PF) 0.9 % injection (  Given by Other 03/21/21 1413)    ED Course/ Medical Decision Making/ A&P                           Medical Decision Making Amount and/or Complexity of Data Reviewed Labs: ordered. Radiology: ordered.  Risk Prescription drug management.   46yo F with a chief complaints of shortness of breath.  This been going on for about 20 days.  This is consistent with  her recent COVID illness.  She was tachycardic upon arrival with minimal exertion.  We will obtain a D-dimer.  Chest x-ray blood work reassess.  D-dimer is elevated.  Troponin negative.  Chest x-ray viewed by me without focal infiltrate or pneumothorax.  CT angiogram of the chest negative for pulmonary embolism or occult pneumonia.  Will discharge patient home.  PCP  follow-up.  4:03 PM:  I have discussed the diagnosis/risks/treatment options with the patient and believe the pt to be eligible for discharge home to follow-up with PCP. We also discussed returning to the ED immediately if new or worsening sx occur. We discussed the sx which are most concerning (e.g., sudden worsening pain, fever, inability to tolerate by mouth) that necessitate immediate return. Medications administered to the patient during their visit and any new prescriptions provided to the patient are listed below.  Medications given during this visit Medications  iohexol (OMNIPAQUE) 350 MG/ML injection 80 mL (80 mLs Intravenous Contrast Given 03/21/21 1329)  sodium chloride (PF) 0.9 % injection (  Given by Other 03/21/21 1413)     The patient appears reasonably screen and/or stabilized for discharge and I doubt any other medical condition or other [facility] requiring further screening, evaluation, or treatment in the ED at this time prior to discharge.          Final Clinical Impression(s) / ED Diagnoses Final diagnoses:  SOB (shortness of breath) on exertion    Rx / DC Orders ED Discharge Orders     None         Deno Etienne, DO 03/21/21 1603

## 2021-03-21 NOTE — Telephone Encounter (Signed)
Chief Complaint BREATHING - shortness of breath or sounds breathless Reason for Call Symptomatic / Request for [place] states she has been experiencing shortness of breath and her inhaler is not working. Translation No Nurse Assessment Nurse: Susy Manor, RN, Megan Date/Time (Eastern Time): 03/21/2021 8:45:15 AM Confirm and document reason for call. If symptomatic, describe symptoms. ---Caller states she has been experiencing shortness of breath and her inhaler is not working. Caller states she tested positive for covid on 1/9 and a weak later, the breathing issues started. Does the patient have any new or worsening symptoms? ---Yes Will a triage be completed? ---Yes Related visit to physician within the last 2 weeks? ---No Does the PT have any chronic conditions? (i.e. diabetes, asthma, this includes High risk factors for pregnancy, etc.) ---No Is the patient pregnant or possibly pregnant? (Ask all females between the ages of 46-55) ---No Is this a behavioral health or substance abuse call? ---No Guidelines Guideline Title Affirmed Question Affirmed Notes Nurse Date/Time (Eastern Time) COVID-19 - Diagnosed or Suspected MODERATE difficulty breathing (e.g., speaks in phrases, SOB even at rest, pulse 100-120) Susy Manor, RN, Megan 03/21/2021 8:46:56 AM PLEASE NOTE: All timestamps contained within this report are represented as [place] Standard Time. CONFIDENTIALTY NOTICE: This fax transmission is intended only for the addressee. It contains information that is legally privileged, confidential or otherwise protected from use or disclosure. If you are not the intended recipient, you are strictly prohibited from reviewing, disclosing, copying using or disseminating any of this information or taking any action in reliance on or regarding this information. If you have received this fax in error, please notify us immediately by telephone so that we can  arrange for its return to [place]. Phone: [phone], Toll-Free: [phone], Fax: [phone] Page: 2 of 2 Call Id: [id number] Zinc. Time Eilene Ghazi Time) Disposition Final User 03/21/2021 8:43:49 AM Send to Urgent Zannie Kehr 03/21/2021 8:49:06 AM Go to ED Now Yes Susy Manor, RN, Megan Caller Disagree/Comply Comply Caller Understands Yes PreDisposition InappropriateToAsk Care Advice Given Per Guideline GO TO ED NOW: * You need to be seen in the Emergency Department. * Go to the ED at ___________ [place] given per COVID-19 - DIAGNOSED OR SUSPECTED (Adult) guideline. CALL EMS 911 IF: * Severe difficulty breathing occurs * Lips or face turns blue * Confusion occurs. WEAR A MASK - COVER YOUR MOUTH AND NOSE: * Wear a mask that fits snuggly over your mouth and nose. TELL HEALTHCARE PERSONNEL THAT YOU MIGHT HAVE COVID-19: * Tell the first healthcare worker you meet that you may have COVID-19. Referrals [facility] - ED  Pt has an appt with Carlota Raspberry today Juluis Rainier

## 2021-03-21 NOTE — ED Triage Notes (Signed)
Pt reports shortness of breath, chest pain, and back pain x1 week. Pt denies hx of asthma or respiratory issues. Denies abdominal pain and N/V/D.

## 2021-03-21 NOTE — Discharge Instructions (Signed)
Your workup here is unremarkable.  Your CT scan did not show a blood clot in the lung or hidden pneumonia.  As we discussed before some people after they have COVID have prolonged respiratory symptoms.  Please follow-up with your doctor.  Let them know that you are seen in the ED.

## 2021-03-23 ENCOUNTER — Telehealth: Payer: 59 | Admitting: Registered Nurse

## 2021-04-07 ENCOUNTER — Ambulatory Visit (HOSPITAL_BASED_OUTPATIENT_CLINIC_OR_DEPARTMENT_OTHER): Payer: 59 | Admitting: Nurse Practitioner

## 2021-04-12 ENCOUNTER — Other Ambulatory Visit: Payer: Self-pay | Admitting: Registered Nurse

## 2021-04-12 ENCOUNTER — Ambulatory Visit: Payer: 59 | Admitting: Physician Assistant

## 2021-04-12 ENCOUNTER — Other Ambulatory Visit: Payer: Self-pay

## 2021-04-12 DIAGNOSIS — L2084 Intrinsic (allergic) eczema: Secondary | ICD-10-CM | POA: Diagnosis not present

## 2021-04-12 DIAGNOSIS — F5104 Psychophysiologic insomnia: Secondary | ICD-10-CM

## 2021-04-12 MED ORDER — DUPIXENT 300 MG/2ML ~~LOC~~ SOAJ: SUBCUTANEOUS | 6 refills | Status: DC

## 2021-04-12 MED ORDER — TRIAMCINOLONE ACETONIDE 0.1 % EX CREA: 1.0000 "application " | TOPICAL_CREAM | Freq: Every day | CUTANEOUS | 11 refills | Status: DC | PRN

## 2021-04-12 MED ORDER — DUPIXENT 300 MG/2ML ~~LOC~~ SOAJ: 300.0000 mg | SUBCUTANEOUS | 0 refills | Status: DC

## 2021-04-14 ENCOUNTER — Telehealth: Payer: Self-pay

## 2021-04-14 ENCOUNTER — Encounter: Payer: Self-pay | Admitting: Physician Assistant

## 2021-04-14 NOTE — Progress Notes (Signed)
° °  Follow-Up Visit   Subjective  Rebecca Collier is a 46 y.o. female who presents for the following: Follow-up (Dupixent follow up. Patient has not been taking it since November. Patient is flared up. Patient needs refill. ). No contraindications or adverse reactions.    The following portions of the chart were reviewed this encounter and updated as appropriate:  Tobacco   Allergies   Meds   Problems   Med Hx   Surg Hx   Fam Hx       Objective  Well appearing patient in no apparent distress; mood and affect are within normal limits.  A full examination was performed including scalp, head, eyes, ears, nose, lips, neck, chest, axillae, abdomen, back, buttocks, bilateral upper extremities, bilateral lower extremities, hands, feet, fingers, toes, fingernails, and toenails. All findings within normal limits unless otherwise noted below.  Total body xerosis and hyperpigmentation.   Assessment & Plan  Intrinsic atopic dermatitis  Patient is on Aldine. Samples given to patient. Obtain new prescription follow up.   Dupilumab (DUPIXENT) 300 MG/2ML SOPN INJECT 1 PEN UNDER THE SKIN EVERY OTHER WEEK  triamcinolone cream (KENALOG) 0.1 % Apply 1 application topically daily as needed.  Dupilumab (DUPIXENT) 300 MG/2ML SOPN Inject 300 mg into the skin every 14 (fourteen) days. Starting at day 15 for maintenance.  Intrinsic (allergic) eczema  Related Medications triamcinolone cream (KENALOG) 0.1 % Apply 1 application topically daily as needed.    I, Pranish Akhavan, PA-C, have reviewed all documentation's for this visit.  The documentation on 04/14/21 for the exam, diagnosis, procedures and orders are all accurate and complete.

## 2021-04-14 NOTE — Telephone Encounter (Signed)
Prior authorization for the patient's Dupixent faxed to [facility] @ [phone].

## 2021-04-15 NOTE — Patient Instructions (Signed)
DUE TO COVID-19 ONLY ONE VISITOR IS ALLOWED TO COME WITH YOU AND STAY IN THE WAITING ROOM ONLY DURING PRE OP AND PROCEDURE DAY OF SURGERY.             Your procedure is scheduled on: 05-06-21    Report to [facility] Main  Entrance   Report to admitting at         [place] AM     Call this number if you have problems the morning of surgery [phone]   Remember: NO SOLID FOOD AFTER MIDNIGHT THE NIGHT PRIOR TO SURGERY. NOTHING BY MOUTH EXCEPT CLEAR LIQUIDS UNTIL    0415 am  .  PLEASE FINISH ENSURE DRINK PER SURGEON ORDER  WHICH NEEDS TO BE COMPLETED AT        [place] am then nothing by mouth.     CLEAR LIQUID DIET                                                                    water Black Coffee and tea, regular and decaf No Creamer , NO MILK                         Plain Jell-O any favor except red or purple                                  Fruit ices (not with fruit pulp)                                      Iced Popsicles                                     Carbonated beverages, regular and diet                                    Cranberry, grape and apple juices Sports drinks like Gatorade Lightly seasoned clear broth or consume(fat free) Sugar, honey syrup   _____________________________________________________________________     BRUSH YOUR TEETH MORNING OF SURGERY AND RINSE YOUR MOUTH OUT, NO CHEWING GUM CANDY OR MINTS.     Take these medicines the morning of surgery with A SIP OF WATER: inhaler bring with you, amlodipine  DO NOT TAKE ANY DIABETIC MEDICATIONS DAY OF YOUR SURGERY                               You may not have any metal on your body including hair pins and              piercings  Do not wear jewelry, make-up, lotions, powders,perfumes,        deodorant             Do not wear nail polish on your fingernails or toenails .  Do not shave  48 hours prior to surgery.  Do not bring valuables to the hospital. [place].  Contacts, dentures or bridgework may not be worn into surgery.  You may bring a small overnight bag with you     Patients discharged the day of surgery will not be allowed to drive home. IF YOU ARE HAVING SURGERY AND GOING HOME THE SAME DAY, YOU MUST HAVE AN ADULT TO DRIVE YOU HOME AND BE WITH YOU FOR 24 HOURS. YOU MAY GO HOME BY TAXI OR UBER OR ORTHERWISE, BUT AN ADULT MUST ACCOMPANY YOU HOME AND STAY WITH YOU FOR 24 HOURS.  Name and phone number of your driver:  Special Instructions: N/A              Please read over the following fact sheets you were given: _____________________________________________________________________             [facility] - Preparing for Surgery Before surgery, you can play an important role.  Because skin is not sterile, your skin needs to be as free of germs as possible.  You can reduce the number of germs on your skin by washing with CHG (chlorahexidine gluconate) soap before surgery.  CHG is an antiseptic cleaner which kills germs and bonds with the skin to continue killing germs even after washing. Please DO NOT use if you have an allergy to CHG or antibacterial soaps.  If your skin becomes reddened/irritated stop using the CHG and inform your nurse when you arrive at Short Stay. Do not shave (including legs and underarms) for at least 48 hours prior to the first CHG shower.  You may shave your face/neck. Please follow these instructions carefully:  1.  Shower with CHG Soap the night before surgery and the  morning of Surgery.  2.  If you choose to wash your hair, wash your hair first as usual with your  normal  shampoo.  3.  After you shampoo, rinse your hair and body thoroughly to remove the  shampoo.                           4.  Use CHG as you would any other liquid soap.  You can apply chg directly  to the skin and wash                       Gently with a scrungie or clean washcloth.  5.  Apply the CHG Soap to your body  ONLY FROM THE NECK DOWN.   Do not use on face/ open                           Wound or open sores. Avoid contact with eyes, ears mouth and genitals (private parts).                       Wash face,  Genitals (private parts) with your normal soap.             6.  Wash thoroughly, paying special attention to the area where your surgery  will be performed.  7.  Thoroughly rinse your body with warm water from the neck down.  8.  DO NOT shower/wash with your normal soap after using and rinsing off  the CHG Soap.  9.  Pat yourself dry with a clean towel.            10.  Wear clean pajamas.            11.  Place clean sheets on your bed the night of your first shower and do not  sleep with pets. Day of Surgery : Do not apply any lotions/deodorants the morning of surgery.  Please wear clean clothes to the hospital/surgery center.  FAILURE TO FOLLOW THESE INSTRUCTIONS MAY RESULT IN THE CANCELLATION OF YOUR SURGERY PATIENT SIGNATURE_________________________________  NURSE SIGNATURE__________________________________  ________________________________________________________________________    Rebecca Collier  An incentive spirometer is a tool that can help keep your lungs clear and active. This tool measures how well you are filling your lungs with each breath. Taking long deep breaths may help reverse or decrease the chance of developing breathing (pulmonary) problems (especially infection) following: A long period of time when you are unable to move or be active. BEFORE THE PROCEDURE  If the spirometer includes an indicator to show your best effort, your nurse or respiratory therapist will set it to a desired goal. If possible, sit up straight or lean slightly forward. Try not to slouch. Hold the incentive spirometer in an upright position. INSTRUCTIONS FOR USE  Sit on the edge of your bed if possible, or sit up as far as you can in bed or on a chair. Hold the incentive  spirometer in an upright position. Breathe out normally. Place the mouthpiece in your mouth and seal your lips tightly around it. Breathe in slowly and as deeply as possible, raising the piston or the ball toward the top of the column. Hold your breath for 3-5 seconds or for as long as possible. Allow the piston or ball to fall to the bottom of the column. Remove the mouthpiece from your mouth and breathe out normally. Rest for a few seconds and repeat Steps 1 through 7 at least 10 times every 1-2 hours when you are awake. Take your time and take a few normal breaths between deep breaths. The spirometer may include an indicator to show your best effort. Use the indicator as a goal to work toward during each repetition. After each set of 10 deep breaths, practice coughing to be sure your lungs are clear. If you have an incision (the cut made at the time of surgery), support your incision when coughing by placing a pillow or rolled up towels firmly against it. Once you are able to get out of bed, walk around indoors and cough well. You may stop using the incentive spirometer when instructed by your caregiver.  RISKS AND COMPLICATIONS Take your time so you do not get dizzy or light-headed. If you are in pain, you may need to take or ask for pain medication before doing incentive spirometry. It is harder to take a deep breath if you are having pain. AFTER USE Rest and breathe slowly and easily. It can be helpful to keep track of a log of your progress. Your caregiver can provide you with a simple table to help with this. If you are using the spirometer at home, follow these instructions: [place] IF:  You are having difficultly using the spirometer. You have trouble using the spirometer as often as instructed. Your pain medication is not giving enough relief while using the spirometer. You develop fever of 100.5 F (38.1 C) or higher. SEEK IMMEDIATE MEDICAL CARE IF:  You cough up bloody  sputum that  had not been present before. You develop fever of 102 F (38.9 C) or greater. You develop worsening pain at or near the incision site. MAKE SURE YOU:  Understand these instructions. Will watch your condition. Will get help right away if you are not doing well or get worse. Document Released: 06/26/2006 Document Revised: 05/08/2011 Document Reviewed: 08/27/2006 [facility] Patient Information 2014 [place], [place].   ________________________________________________________________________

## 2021-04-15 NOTE — Progress Notes (Addendum)
PCP - Maximiano Coss, MD Cardiologist - no  PPM/ICD -  Device Orders -  Rep Notified -   Chest x-ray - 03-21-21- epic CTA chest 03-21-21 epic EKG - 03-22-21 epic Stress Test -  ECHO -  Cardiac Cath -   Sleep Study -  CPAP -   Fasting Blood Sugar -  Checks Blood Sugar _____ times a day  Blood Thinner Instructions: Aspirin Instructions:  ERAS Protcol - PRE-SURGERY Ensure o  COVID TEST- N/A   Also was  COVID + 03-07-21  COVID vaccine -x2 pfizer  Activity--Able to walk a flight of stairs without SOB Anesthesia review: HTN, BP elevated at preop pt. Aware of risk of cancellation due to Increased BP DOS. Pt. States she will contact her PCP today for an appt. .  Patient denies shortness of breath, fever, cough and chest pain at PAT appointment   All instructions explained to the patient, with a verbal understanding of the material. Patient agrees to go over the instructions while at home for a better understanding. Patient also instructed to self quarantine after being tested for COVID-19. The opportunity to ask questions was provided.

## 2021-04-18 ENCOUNTER — Telehealth: Payer: Self-pay | Admitting: Physician Assistant

## 2021-04-18 ENCOUNTER — Other Ambulatory Visit: Payer: Self-pay

## 2021-04-18 ENCOUNTER — Encounter (HOSPITAL_COMMUNITY)
Admission: RE | Admit: 2021-04-18 | Discharge: 2021-04-18 | Disposition: A | Discharge status: Home / Self Care | Payer: 59 | Source: Ambulatory Visit | Attending: Orthopedic Surgery | Admitting: Orthopedic Surgery

## 2021-04-18 ENCOUNTER — Encounter (HOSPITAL_COMMUNITY): Payer: Self-pay

## 2021-04-18 VITALS — BP 199/112 | HR 75 | Temp 99.0°F | Resp 16 | Ht 66.0 in | Wt 329.0 lb

## 2021-04-18 DIAGNOSIS — Z01812 Encounter for preprocedural laboratory examination: Secondary | ICD-10-CM | POA: Diagnosis not present

## 2021-04-18 DIAGNOSIS — I10 Essential (primary) hypertension: Secondary | ICD-10-CM | POA: Insufficient documentation

## 2021-04-18 HISTORY — DX: Unspecified asthma, uncomplicated: J45.909

## 2021-04-18 LAB — CBC
HCT: 42.3 % (ref 36.0–46.0)
Hemoglobin: 13.5 g/dL (ref 12.0–15.0)
MCH: 28.2 pg (ref 26.0–34.0)
MCHC: 31.9 g/dL (ref 30.0–36.0)
MCV: 88.3 fL (ref 80.0–100.0)
Platelets: 305 10*3/uL (ref 150–400)
RBC: 4.79 MIL/uL (ref 3.87–5.11)
RDW: 13.8 % (ref 11.5–15.5)
WBC: 7.1 10*3/uL (ref 4.0–10.5)
nRBC: 0 % (ref 0.0–0.2)

## 2021-04-18 LAB — BASIC METABOLIC PANEL
Anion gap: 5 (ref 5–15)
BUN: 15 mg/dL (ref 6–20)
CO2: 25 mmol/L (ref 22–32)
Calcium: 10 mg/dL (ref 8.9–10.3)
Chloride: 105 mmol/L (ref 98–111)
Creatinine, Ser: 1.06 mg/dL — ABNORMAL HIGH (ref 0.44–1.00)
GFR, Estimated: >60 mL/min (ref >60)
Glucose, Bld: 110 mg/dL — ABNORMAL HIGH (ref 70–99)
Potassium: 4.1 mmol/L (ref 3.5–5.1)
Sodium: 135 mmol/L (ref 135–145)

## 2021-04-18 NOTE — Telephone Encounter (Signed)
[facility] LM on office VM asking for a call back about PA [phone].

## 2021-04-18 NOTE — Telephone Encounter (Signed)
[facility] wanted more office notes with tried and failed medication 2021 JCB note sent with additional medication list [phone]

## 2021-04-18 NOTE — Telephone Encounter (Signed)
Faxed over more office noted today, see previous note

## 2021-04-19 ENCOUNTER — Telehealth: Payer: Self-pay | Admitting: *Deleted

## 2021-04-19 NOTE — Telephone Encounter (Signed)
Fax from [employer] that patients Dupixent is approved 04/18/2021-08/16/2021.

## 2021-04-20 ENCOUNTER — Encounter: Payer: Self-pay | Admitting: Registered Nurse

## 2021-04-20 ENCOUNTER — Telehealth (INDEPENDENT_AMBULATORY_CARE_PROVIDER_SITE_OTHER): Payer: 59 | Admitting: Registered Nurse

## 2021-04-20 ENCOUNTER — Other Ambulatory Visit: Payer: Self-pay

## 2021-04-20 DIAGNOSIS — I10 Essential (primary) hypertension: Secondary | ICD-10-CM | POA: Diagnosis not present

## 2021-04-20 MED ORDER — HYDRALAZINE HCL 25 MG PO TABS: 25.0000 mg | ORAL_TABLET | Freq: Three times a day (TID) | ORAL | 0 refills | Status: DC

## 2021-04-20 NOTE — Progress Notes (Signed)
Telemedicine Encounter- SOAP NOTE Established Patient  This video encounter was conducted with the patient's (or proxy's) verbal consent via audio telecommunications: yes/no: Yes Patient was instructed to have this encounter in a suitably private space; and to only have persons present to whom they give permission to participate. In addition, patient identity was confirmed by use of name plus two identifiers (DOB and address).  I discussed the limitations, risks, security and privacy concerns of performing an evaluation and management service by telephone and the availability of in person appointments. I also discussed with the patient that there may be a patient responsible charge related to this service. The patient expressed understanding and agreed to proceed.  I spent a total of 16 minutes talking with the patient or their proxy.  Patient at home Provider in office  Participants: [name], NP and Esther Hardy  Chief Complaint  Patient presents with   Hypertension    Patient states she is taking the BP medication and patient states her BP 169/125 , 199/100 and she needs it to be down to get her surgery. Patient states she can not take losartan because she gets sick     Subjective   Rebecca Collier is a 46 y.o. established patient. Video visit today for HTN  HPI BP very elevated. Last available readings are 199/112, 170/110, 167/92, 198/125, 162/101 dating back through last year.  She is on amlodipine 25m po qd. Intolerant to losartan, lisinopril, and thiazide diuretics. I have not seen her in office since late 2021 despite urging follow up in person.  She does not have any acute symptoms of htn including chest pain, shob, doe, headaches, visual changes, claudication, or dependent edema.     Patient Active Problem List   Diagnosis Date Noted   Elevated blood sugar 05/21/2019   Vitamin D deficiency 05/06/2019   Well female exam with routine gynecological exam 04/23/2019    Class 3 severe obesity with body mass index (BMI) of 50.0 to 59.9 in adult ([facility] 04/23/2019   Psychophysiological insomnia 04/23/2019   GAD (generalized anxiety disorder) 04/23/2019   Chronic migraine without aura without status migrainosus, not intractable 04/23/2019   Eczema 02/06/2013   Essential hypertension, benign 02/06/2013   Family history of colon cancer in mother (age 641 02/06/2013    Past Medical History:  Diagnosis Date   Allergy    Anxiety    Arthritis    Asthma    since COVID   Eczema    Hypertension    Obesity    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     albuterol (VENTOLIN HFA) 108 (90 Base) MCG/ACT inhaler Inhale 2 puffs into the lungs every 4 (four) hours as needed for wheezing or shortness of breath. 18 g 2   amLODipine (NORVASC) 10 MG tablet Take 1 tablet (10 mg total) by mouth daily. 90 tablet 1   azelastine (ASTELIN) 0.1 % nasal spray Place 1 spray into both nostrils 2 (two) times daily. Use in each nostril as directed (Patient taking differently: Place 1 spray into both nostrils 2 (two) times daily as needed for allergies or rhinitis. Use in each nostril as directed) 30 mL 12   BLACK CURRANT SEED OIL PO Take 1,300 mg by mouth 2 (two) times daily.  buPROPion (WELLBUTRIN XL) 300 MG 24 hr tablet Take 1 tablet (300 mg total) by mouth daily. (Patient taking differently: Take 300 mg by mouth at bedtime as needed (Calm dowm).) 90 tablet 1   Dupilumab (DUPIXENT) 300 MG/2ML SOPN INJECT 1 PEN UNDER THE SKIN EVERY OTHER WEEK 2 mL 6   hydrALAZINE (APRESOLINE) 25 MG tablet Take 1 tablet (25 mg total) by mouth 3 (three) times daily. 270 tablet 0   OVER THE COUNTER MEDICATION Take 450 mg by mouth daily. [place] Root     traZODone (DESYREL) 50 MG tablet TAKE 0.5-1 TABLETS (25-50 MG TOTAL) BY  MOUTH AT BEDTIME AS NEEDED. FOR SLEEP 90 tablet 2   triamcinolone cream (KENALOG) 0.1 % Apply 1 application topically daily as needed. 453 g 11   vitamin B-12 (CYANOCOBALAMIN) 1000 MCG tablet Take 1,000 mcg by mouth daily.     vitamin E 180 MG (400 UNITS) capsule Take 400 Units by mouth daily.     Clobetasol Prop Emollient Base 0.05 % emollient cream Apply to affected area bid (Patient not taking: Reported on 04/14/2021) 60 g 2   Dupilumab (DUPIXENT) 300 MG/2ML SOPN Inject 300 mg into the skin every 14 (fourteen) days. Starting at day 15 for maintenance. 2 mL 0   QUEtiapine (SEROQUEL) 25 MG tablet TAKE 1 TABLET BY MOUTH EVERYDAY AT BEDTIME (Patient not taking: Reported on 04/20/2021) 90 tablet 1   No current facility-administered medications for this visit.    Allergies  Allergen Reactions   Lisinopril Cough   Hydrochlorothiazide Nausea And Vomiting   Losartan Nausea And Vomiting   Penicillins Rash    Social History   Socioeconomic History   Marital status: Single    Spouse name: Not on file   Number of children: Not on file   Years of education: Not on file   Highest education level: Not on file  Occupational History   Not on file  Tobacco Use   Smoking status: Never   Smokeless tobacco: Never  Vaping Use   Vaping Use: Never used  Substance and Sexual Activity   Alcohol use: Not Currently    Comment: socially   Drug use: Yes    Types: Marijuana    Comment: occasional   Sexual activity: Not Currently    Birth control/protection: None  Other Topics Concern   Not on file  Social History Narrative   Work or School: Quarry manager, Physicist, 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      Home Situation: lives with mother      Spiritual Beliefs: none      Lifestyle: no regular exercise; diet is ok         Social Determinants of Radio broadcast assistant Strain: Not on file  Food Insecurity: Not on file  Transportation Needs: Not on file  Physical Activity: Not on file  Stress: Not on file   Social Connections: Not on file  Intimate Partner Violence: Not on file    ROS Per hpi   Objective   Vitals as reported by the patient: There were no vitals filed for this visit.  [name] was seen today for hypertension.  Diagnoses and all orders for this visit:  Essential hypertension, benign -     hydrALAZINE (APRESOLINE) 25 MG tablet; Take 1 tablet (25 mg total) by mouth 3 (three) times daily. -     Ambulatory referral to [facility] [place]    PLAN Hydralazine 52m po tid Refer to advanced htn clinic  She will have in office follow up with me next week Patient encouraged to call clinic with any questions, comments, or concerns.  I discussed the assessment and treatment plan with the patient. The patient was provided an opportunity to ask questions and all were answered. The patient agreed with the plan and demonstrated an understanding of the instructions.   The patient was advised to call back or seek an in-person evaluation if the symptoms worsen or if the condition fails to improve as anticipated.  I provided 16 minutes of face-to-face time during this encounter.  Maximiano Coss, NP

## 2021-04-20 NOTE — Progress Notes (Signed)
LM making pt aware that she needs to schedule an appt with Richard in office next week.

## 2021-04-21 NOTE — Telephone Encounter (Signed)
Hey. I forgot to tell you that my D dimer test came back positive from my hospital visit on January 23rd. Should I be concerned?

## 2021-04-21 NOTE — Progress Notes (Signed)
Hypertension Clinic Initial Assessment:    Date:  04/27/2021   ID:  Rebecca Collier, DOB 12/29/1975, MRN [id number]  PCP:  Maximiano Coss, NP  Cardiologist:  Skeet Latch, MD  Nephrologist:  Referring MD: Maximiano Coss, NP   CC: Hypertension  History of Present Illness:    Rebecca Collier is a 46 y.o. female with a hx of essential hypertension, chronic migraine, eczema, obesity, generalized anxiety disorder, vitamin D deficiency, insomnia, and COVID infection.  She is here to establish care in the hypertension clinic.   She was recently seen in the emergency department on 03/21/2021 with complaints of shortness of breath.  The increased work of breathing has been present for about a week.  She also complained of tightness across her chest and back.  She reported recently getting over a COVID infection.  She had been treated with steroids and Paxilovid.  She was noted to have an elevated D-dimer.  Troponins were negative.  Her chest x-ray showed no acute disease process.  CT angio chest was negative for PE and pneumonia.  She was discharged home in stable condition on 03/22/2021.   She presents to the clinic today for follow-up evaluation and states she has noticed an improvement in her blood pressure with taking hydralazine.  She works nights.  She usually takes her hydralazine at 10 AM, 3-4 PM, and at 730-8 PM before she returns to work.  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  We reviewed secondary causes of hypertension.  She routinely drinks green tea with ginseng.  She prepares most of her meals at home.  She was previously going to be evaluated for OSA however, due to [place] she never completed sleep study.  She has taken amlodipine for several years but has not noticed an improvement in her blood pressure.  I will order a sleep study, start metoprolol succinate 25 mg daily, give the salty 6 diet sheet, have her increase her physical activity as tolerated, and plan  follow-up in 1 month.  Today she denies chest pain, shortness of breath, lower extremity edema, fatigue, palpitations, melena, hematuria, hemoptysis, diaphoresis, weakness, presyncope, syncope, orthopnea, and PND.   Previous antihypertensives: Lisinopril HCTZ  Current antihypertensive medication  Amlodipine 10 mg daily  Hydralazine 25 mg 3 times daily  Metoprolol succinate 25 mg daily  Secondary Causes of Hypertension   Medications/Herbal: OCP, steroids, stimulants, antidepressants, weight loss medication, immune suppressants, NSAIDs, sympathomimetics, alcohol, caffeine, licorice, ginseng, St. John's wort, chemo   Sleep Apnea Renal artery stenosis Hyperaldosteronism Hyper/hypothyroidism Pheochromocytoma: palpitations, tachycardia, headache, diaphoresis (plasma metanephrines) Cushing's syndrome: Cushingoid facies, central obesity, proximal muscle weakness, and ecchymoses, adrenal incidentaloma (cortisol) Coarctation of the aorta  Past Medical History:  Diagnosis Date   Allergy    Anxiety    Arthritis    Asthma    since COVID   Eczema    Hypertension    Obesity    Sciatica    spasms daily in left leg     Past Surgical History:  Procedure Laterality Date   NO PAST SURGERIES     WISDOM TOOTH EXTRACTION      Current Medications: Current Meds  Medication Sig   acetaminophen (TYLENOL) 500 MG tablet Take 1,000 mg by mouth every 6 (six) hours as needed for moderate pain.   albuterol (VENTOLIN HFA) 108 (90 Base) MCG/ACT inhaler Inhale 2 puffs into the lungs every 4 (four) hours as needed for wheezing or shortness of breath.   azelastine (ASTELIN)  0.1 % nasal spray Place 1 spray into both nostrils 2 (two) times daily. Use in each nostril as directed (Patient taking differently: Place 1 spray into both nostrils 2 (two) times daily as needed for allergies or rhinitis. Use in each nostril as directed)   BLACK CURRANT SEED OIL PO Take 1,300 mg by mouth 2 (two) times daily.    buPROPion (WELLBUTRIN XL) 300 MG 24 hr tablet Take 1 tablet (300 mg total) by mouth daily. (Patient taking differently: Take 300 mg by mouth at bedtime as needed (Calm dowm).)   Clobetasol Prop Emollient Base 0.05 % emollient cream Apply to affected area bid   Dupilumab (DUPIXENT) 300 MG/2ML SOPN Inject 300 mg into the skin every 14 (fourteen) days. Starting at day 15 for maintenance.   hydrALAZINE (APRESOLINE) 25 MG tablet Take 1 tablet (25 mg total) by mouth 3 (three) times daily.   metoprolol succinate (TOPROL XL) 25 MG 24 hr tablet Take 1 tablet (25 mg total) by mouth daily.   OVER THE COUNTER MEDICATION Take 450 mg by mouth daily. Burdock Root   QUEtiapine (SEROQUEL) 25 MG tablet TAKE 1 TABLET BY MOUTH EVERYDAY AT BEDTIME   traZODone (DESYREL) 50 MG tablet TAKE 0.5-1 TABLETS (25-50 MG TOTAL) BY MOUTH AT BEDTIME AS NEEDED. FOR SLEEP   triamcinolone cream (KENALOG) 0.1 % Apply 1 application topically daily as needed.   vitamin B-12 (CYANOCOBALAMIN) 1000 MCG tablet Take 1,000 mcg by mouth daily.   vitamin E 180 MG (400 UNITS) capsule Take 400 Units by mouth daily.   [DISCONTINUED] amLODipine (NORVASC) 10 MG tablet Take 1 tablet (10 mg total) by mouth daily.     Allergies:   Lisinopril, Hydrochlorothiazide, Losartan, and Penicillins   Social History   Socioeconomic History   Marital status: Single    Spouse name: Not on file   Number of children: Not on file   Years of education: Not on file   Highest education level: Not on file  Occupational History   Not on file  Tobacco Use   Smoking status: Never   Smokeless tobacco: Never  Vaping Use   Vaping Use: Never used  Substance and Sexual Activity   Alcohol use: Not Currently    Comment: socially   Drug use: Yes    Types: Marijuana    Comment: occasional   Sexual activity: Not Currently    Birth control/protection: None  Other Topics Concern   Not on file  Social History Narrative   Work or School: Quarry manager, Physicist, medical  for vets.      Home Situation: lives with mother      Spiritual Beliefs: none      Lifestyle: no regular exercise; diet is ok         Social Determinants of Radio broadcast assistant Strain: Not on file  Food Insecurity: Not on file  Transportation Needs: Not on file  Physical Activity: Not on file  Stress: Not on file  Social Connections: Not on file     Family History: The patient's family history includes Cancer in her mother; Colon cancer in her mother; Healthy in her brother and father; Hypertension in her mother.  ROS:   Please see the history of present illness.     All other systems reviewed and are negative.  EKGs/Labs/Other Studies Reviewed:    EKG: None today.  Recent Labs: 04/27/2021: ALT 12; BUN 13; Creatinine, Ser 0.95; Hemoglobin 12.7; Platelets 283.0; Potassium 4.0; Sodium 137; TSH 1.90  Recent Lipid Panel    Component Value Date/Time   CHOL 199 04/27/2021 1344   CHOL 227 (H) 01/02/2020 1115   TRIG 113.0 04/27/2021 1344   HDL 55.00 04/27/2021 1344   HDL 70 01/02/2020 1115   CHOLHDL 4 04/27/2021 1344   VLDL 22.6 04/27/2021 1344   LDLCALC 122 (H) 04/27/2021 1344   LDLCALC 146 (H) 01/02/2020 1115    Physical Exam:    VS:  BP 138/82    Pulse 65    Ht 5' 6"  (1.676 m)    Wt (!) 334 lb 3.2 oz (151.6 kg)    LMP 04/04/2021 (Approximate)    BMI 53.94 kg/m     Wt Readings from Last 3 Encounters:  04/27/21 (!) 334 lb 3.2 oz (151.6 kg)  04/27/21 (!) 333 lb 3.2 oz (151.1 kg)  04/18/21 (!) 329 lb (149.2 kg)     GEN:  Well nourished, well developed in no acute distress HEENT: Normal NECK: No JVD; No carotid bruits LYMPHATICS: No lymphadenopathy CARDIAC: RRR, no murmurs, rubs, gallops RESPIRATORY:  Clear to auscultation without rales, wheezing or rhonchi  ABDOMEN: Soft, non-tender, non-distended MUSCULOSKELETAL:  No edema; No deformity  SKIN: Warm and dry NEUROLOGIC:  Alert and oriented x 3 PSYCHIATRIC:  Normal affect   ASSESSMENT/PLAN:       Essential hypertension-BP today 138/82.    Reports medication compliance.  Allergies to HCTZ and lisinopril.  Emphasized importance of lifestyle modification with increased physical activity and continue heart healthy low-sodium diet. Continue hydralazine Restart amlodipine Start metoprolol succinate 25 mg daily Heart healthy low-sodium diet-salty 6 given Increase physical activity as tolerated  Reviewed secondary causes of hypertension. Maintain blood pressure log Social work consult for blood pressure cuff with extra long extra-large adult cuff  Daytime somnolence-reports snoring.  Screened with Epworth and STOP-BANG.  Discussed importance of changing sleeping position, weight loss, and avoiding EtOH/sedatives before bed. Order split-night sleep study  Obesity-weight today 334.2 pounds.  Discussed the importance of increased physical activity and calorie restricted diet. Referral to healthy weight and wellness Heart healthy low-sodium high-fiber diet  Disposition:    FU with MD/PharmD in 1 month    Medication Adjustments/Labs and Tests Ordered: Current medicines are reviewed at length with the patient today.  Concerns regarding medicines are outlined above.  Orders Placed This Encounter  Procedures   Split night study   Meds ordered this encounter  Medications   metoprolol succinate (TOPROL XL) 25 MG 24 hr tablet    Sig: Take 1 tablet (25 mg total) by mouth daily.    Dispense:  90 tablet    Refill:  3   amLODipine (NORVASC) 10 MG tablet    Sig: Take 1 tablet (10 mg total) by mouth daily.    Dispense:  90 tablet    Refill:  3     Signed, Deberah Pelton, NP  04/27/2021 5:33 PM    [facility]

## 2021-04-27 ENCOUNTER — Ambulatory Visit: Payer: 59 | Admitting: Registered Nurse

## 2021-04-27 ENCOUNTER — Encounter: Payer: Self-pay | Admitting: Registered Nurse

## 2021-04-27 ENCOUNTER — Encounter (HOSPITAL_BASED_OUTPATIENT_CLINIC_OR_DEPARTMENT_OTHER): Payer: Self-pay | Admitting: General Practice

## 2021-04-27 ENCOUNTER — Other Ambulatory Visit: Payer: Self-pay

## 2021-04-27 ENCOUNTER — Ambulatory Visit (HOSPITAL_BASED_OUTPATIENT_CLINIC_OR_DEPARTMENT_OTHER): Payer: 59 | Admitting: General Practice

## 2021-04-27 VITALS — BP 138/82 | HR 65 | Ht 66.0 in | Wt 334.2 lb

## 2021-04-27 VITALS — BP 161/94 | HR 83 | Temp 98.0°F | Resp 18 | Ht 66.0 in | Wt 333.2 lb

## 2021-04-27 DIAGNOSIS — Z6841 Body Mass Index (BMI) 40.0 and over, adult: Secondary | ICD-10-CM

## 2021-04-27 DIAGNOSIS — R400 Somnolence: Secondary | ICD-10-CM | POA: Diagnosis not present

## 2021-04-27 DIAGNOSIS — I10 Essential (primary) hypertension: Secondary | ICD-10-CM

## 2021-04-27 LAB — CBC WITH DIFFERENTIAL/PLATELET
Basophils Absolute: 0.1 10*3/uL (ref 0.0–0.1)
Basophils Relative: 1.6 % (ref 0.0–3.0)
Eosinophils Absolute: 0.3 10*3/uL (ref 0.0–0.7)
Eosinophils Relative: 5.3 % — ABNORMAL HIGH (ref 0.0–5.0)
HCT: 38.8 % (ref 36.0–46.0)
Hemoglobin: 12.7 g/dL (ref 12.0–15.0)
Lymphocytes Relative: 29.2 % (ref 12.0–46.0)
Lymphs Abs: 1.6 10*3/uL (ref 0.7–4.0)
MCHC: 32.7 g/dL (ref 30.0–36.0)
MCV: 86.7 fl (ref 78.0–100.0)
Monocytes Absolute: 0.6 10*3/uL (ref 0.1–1.0)
Monocytes Relative: 10 % (ref 3.0–12.0)
Neutro Abs: 3 10*3/uL (ref 1.4–7.7)
Neutrophils Relative %: 53.9 % (ref 43.0–77.0)
Platelets: 283 10*3/uL (ref 150.0–400.0)
RBC: 4.48 Mil/uL (ref 3.87–5.11)
RDW: 14.3 % (ref 11.5–15.5)
WBC: 5.6 10*3/uL (ref 4.0–10.5)

## 2021-04-27 LAB — LIPID PANEL
Cholesterol: 199 mg/dL (ref 0–200)
HDL: 55 mg/dL (ref >39.00)
LDL Cholesterol: 122 mg/dL — ABNORMAL HIGH (ref 0–99)
NonHDL: 144.19
Total CHOL/HDL Ratio: 4
Triglycerides: 113 mg/dL (ref 0.0–149.0)
VLDL: 22.6 mg/dL (ref 0.0–40.0)

## 2021-04-27 LAB — COMPREHENSIVE METABOLIC PANEL
ALT: 12 U/L (ref 0–35)
AST: 13 U/L (ref 0–37)
Albumin: 4.1 g/dL (ref 3.5–5.2)
Alkaline Phosphatase: 75 U/L (ref 39–117)
BUN: 13 mg/dL (ref 6–23)
CO2: 26 mEq/L (ref 19–32)
Calcium: 9.6 mg/dL (ref 8.4–10.5)
Chloride: 106 mEq/L (ref 96–112)
Creatinine, Ser: 0.95 mg/dL (ref 0.40–1.20)
GFR: 72.35 mL/min (ref >60.00)
Glucose, Bld: 100 mg/dL — ABNORMAL HIGH (ref 70–99)
Potassium: 4 mEq/L (ref 3.5–5.1)
Sodium: 137 mEq/L (ref 135–145)
Total Bilirubin: 0.3 mg/dL (ref 0.2–1.2)
Total Protein: 7 g/dL (ref 6.0–8.3)

## 2021-04-27 LAB — TSH: TSH: 1.9 u[IU]/mL (ref 0.35–5.50)

## 2021-04-27 LAB — HEMOGLOBIN A1C: Hgb A1c MFr Bld: 5.9 % (ref 4.6–6.5)

## 2021-04-27 MED ORDER — AMLODIPINE BESYLATE 10 MG PO TABS: 10.0000 mg | ORAL_TABLET | Freq: Every day | ORAL | 3 refills | Status: DC

## 2021-04-27 MED ORDER — METOPROLOL SUCCINATE ER 25 MG PO TB24: 25.0000 mg | ORAL_TABLET | Freq: Every day | ORAL | 3 refills | Status: DC

## 2021-04-27 NOTE — Progress Notes (Signed)
Established Patient Office Visit  Subjective:  Patient ID: Rebecca Collier, female    DOB: [birth year]  Age: 46 y.o. MRN: [id number]  CC:  Chief Complaint  Patient presents with   Follow-up    Patient is here for a follow up on BP. Patient has took medication today    HPI Rebecca Collier presents for BP follow up  Hypertension: Patient Currently taking: amlodipine 14m po qd, hydralazine 255mpo tid. Good effect. No AEs. Denies CV symptoms including: chest pain, shob, doe, headache, visual changes, fatigue, claudication, and dependent edema.   Previous readings and labs: BP Readings from Last 3 Encounters:  04/27/21 (!) 161/94  04/18/21 (!) 199/112  03/21/21 (!) 170/110   Lab Results  Component Value Date   CREATININE 1.06 (H) 04/18/2021    She was seen by me via VV last week where she was having home readings around what she has in office today. She was started on hydralazine at that time. In the past, she has had intolerances to ACE-I (cough), thiazide diuretics (nv), ARB (nv).   She had been seen in ED on 03/21/21 for DOE. Work up largely negative with a mildly elevated d-dimer but negative CTA, ekg, other labs.  Past Medical History:  Diagnosis Date   Allergy    Anxiety    Arthritis    Asthma    since COVID   Eczema    Hypertension    Obesity    Sciatica    spasms daily in left leg     Past Surgical History:  Procedure Laterality Date   NO PAST SURGERIES     WISDOM TOOTH EXTRACTION      Family History  Problem Relation Age of Onset   Colon cancer Mother        dx at age   Hypertension Mother    Cancer Mother        colon   Healthy Father    Healthy Brother     Social History   Socioeconomic History   Marital status: Single    Spouse name: Not on file   Number of children: Not on file   Years of education: Not on file   Highest education level: Not on file  Occupational History   Not on file  Tobacco Use   Smoking status: Never   Smokeless  tobacco: Never  Vaping Use   Vaping Use: Never used  Substance and Sexual Activity   Alcohol use: Not Currently    Comment: socially   Drug use: Yes    Types: Marijuana    Comment: occasional   Sexual activity: Not Currently    Birth control/protection: None  Other Topics Concern   Not on file  Social History Narrative   Work or School: laQuarry managerIDPhysicist, medicalor vets.      Home Situation: lives with mother      Spiritual Beliefs: none      Lifestyle: no regular exercise; diet is ok         Social Determinants of HeRadio broadcast assistanttrain: Not on file  Food Insecurity: Not on file  Transportation Needs: Not on file  Physical Activity: Not on file  Stress: Not on file  Social Connections: Not on file  Intimate Partner Violence: Not on file    Outpatient Medications Prior to Visit  Medication Sig Dispense Refill   acetaminophen (TYLENOL) 500 MG tablet Take 1,000 mg by mouth every 6 (six) hours as needed for  moderate pain.     albuterol (VENTOLIN HFA) 108 (90 Base) MCG/ACT inhaler Inhale 2 puffs into the lungs every 4 (four) hours as needed for wheezing or shortness of breath. 18 g 2   amLODipine (NORVASC) 10 MG tablet Take 1 tablet (10 mg total) by mouth daily. 90 tablet 1   azelastine (ASTELIN) 0.1 % nasal spray Place 1 spray into both nostrils 2 (two) times daily. Use in each nostril as directed (Patient taking differently: Place 1 spray into both nostrils 2 (two) times daily as needed for allergies or rhinitis. Use in each nostril as directed) 30 mL 12   BLACK CURRANT SEED OIL PO Take 1,300 mg by mouth 2 (two) times daily.     buPROPion (WELLBUTRIN XL) 300 MG 24 hr tablet Take 1 tablet (300 mg total) by mouth daily. (Patient taking differently: Take 300 mg by mouth at bedtime as needed (Calm dowm).) 90 tablet 1   Clobetasol Prop Emollient Base 0.05 % emollient cream Apply to affected area bid 60 g 2   Dupilumab (DUPIXENT) 300 MG/2ML SOPN INJECT 1 PEN UNDER  THE SKIN EVERY OTHER WEEK 2 mL 6   Dupilumab (DUPIXENT) 300 MG/2ML SOPN Inject 300 mg into the skin every 14 (fourteen) days. Starting at day 15 for maintenance. 2 mL 0   hydrALAZINE (APRESOLINE) 25 MG tablet Take 1 tablet (25 mg total) by mouth 3 (three) times daily. 270 tablet 0   OVER THE COUNTER MEDICATION Take 450 mg by mouth daily. Burdock Root     QUEtiapine (SEROQUEL) 25 MG tablet TAKE 1 TABLET BY MOUTH EVERYDAY AT BEDTIME 90 tablet 1   traZODone (DESYREL) 50 MG tablet TAKE 0.5-1 TABLETS (25-50 MG TOTAL) BY MOUTH AT BEDTIME AS NEEDED. FOR SLEEP 90 tablet 2   triamcinolone cream (KENALOG) 0.1 % Apply 1 application topically daily as needed. 453 g 11   vitamin B-12 (CYANOCOBALAMIN) 1000 MCG tablet Take 1,000 mcg by mouth daily.     vitamin E 180 MG (400 UNITS) capsule Take 400 Units by mouth daily.     No facility-administered medications prior to visit.    Allergies  Allergen Reactions   Lisinopril Cough   Hydrochlorothiazide Nausea And Vomiting   Losartan Nausea And Vomiting   Penicillins Rash    ROS Review of Systems  Constitutional: Negative.   HENT: Negative.    Eyes: Negative.   Respiratory: Negative.    Cardiovascular: Negative.   Gastrointestinal: Negative.   Genitourinary: Negative.   Musculoskeletal: Negative.   Skin: Negative.   Neurological: Negative.   Psychiatric/Behavioral: Negative.    All other systems reviewed and are negative.    Objective:    Physical Exam Vitals and nursing note reviewed.  Constitutional:      General: She is not in acute distress.    Appearance: Normal appearance. She is normal weight. She is not ill-appearing, toxic-appearing or diaphoretic.  Cardiovascular:     Rate and Rhythm: Normal rate and regular rhythm.     Heart sounds: Normal heart sounds. No murmur heard.   No friction rub. No gallop.  Pulmonary:     Effort: Pulmonary effort is normal. No respiratory distress.     Breath sounds: Normal breath sounds. No stridor.  No wheezing, rhonchi or rales.  Chest:     Chest wall: No tenderness.  Skin:    General: Skin is warm and dry.  Neurological:     General: No focal deficit present.     Mental Status: She is  alert and oriented to person, place, and time. Mental status is at baseline.  Psychiatric:        Mood and Affect: Mood normal.        Behavior: Behavior normal.        Thought Content: Thought content normal.        Judgment: Judgment normal.    BP (!) 161/94    Pulse 83    Temp 98 F (36.7 C) (Temporal)    Resp 18    Ht 5' 6"  (1.676 m)    Wt (!) 333 lb 3.2 oz (151.1 kg)    LMP 04/04/2021 (Approximate)    SpO2 97%    BMI 53.78 kg/m  Wt Readings from Last 3 Encounters:  04/27/21 (!) 333 lb 3.2 oz (151.1 kg)  04/18/21 (!) 329 lb (149.2 kg)  03/07/21 (!) 326 lb (147.9 kg)     Health Maintenance Due  Topic Date Due   Hepatitis C Screening  Never done   COVID-19 Vaccine (3 - Booster for Moderna series) 09/07/2019   INFLUENZA VACCINE  Never done   COLONOSCOPY (Pts 45-17yr Insurance coverage will need to be confirmed)  Never done    There are no preventive care reminders to display for this patient.  Lab Results  Component Value Date   TSH 0.744 03/19/2019   Lab Results  Component Value Date   WBC 7.1 04/18/2021   HGB 13.5 04/18/2021   HCT 42.3 04/18/2021   MCV 88.3 04/18/2021   PLT 305 04/18/2021   Lab Results  Component Value Date   NA 135 04/18/2021   K 4.1 04/18/2021   CO2 25 04/18/2021   GLUCOSE 110 (H) 04/18/2021   BUN 15 04/18/2021   CREATININE 1.06 (H) 04/18/2021   BILITOT <0.2 01/02/2020   ALKPHOS 96 01/02/2020   AST 11 01/02/2020   ALT 14 01/02/2020   PROT 7.1 01/02/2020   ALBUMIN 4.1 01/02/2020   CALCIUM 10.0 04/18/2021   ANIONGAP 5 04/18/2021   GFR 103.96 03/24/2015   Lab Results  Component Value Date   CHOL 227 (H) 01/02/2020   Lab Results  Component Value Date   HDL 70 01/02/2020   Lab Results  Component Value Date   LDLCALC 146 (H) 01/02/2020    Lab Results  Component Value Date   TRIG 65 01/02/2020   Lab Results  Component Value Date   CHOLHDL 3.2 01/02/2020   Lab Results  Component Value Date   HGBA1C 5.9 (H) 01/02/2020      Assessment & Plan:   Problem List Items Addressed This Visit   None Visit Diagnoses     Primary hypertension    -  Primary   Relevant Orders   CBC with Differential/Platelet   Comprehensive metabolic panel   Hemoglobin A1c   Lipid panel   TSH       No orders of the defined types were placed in this encounter.   Follow-up: Return in about 6 months (around 10/28/2021) for 6-10 mo for CPE and labs.   PLAN BP improved, goal remains below 130/90. Fortunately pt able to be seen by HTN clinic today - will defer on further med changes to them. Labs collected. Will follow up with the patient as warranted. Patient encouraged to call clinic with any questions, comments, or concerns.  RMaximiano Coss NP

## 2021-04-27 NOTE — Patient Instructions (Addendum)
Ms. Bobrowski -  ? ?Great to see you. ? ?Glad the BP is getting better. We're getting there! The hypertension clinic is the perfect spot to be. I'm glad they were able to get you in so soon. ? ?I'll let you know if labs present any concerns. ? ?Pop by before the end of the year for a physical and repeat labs (if warranted). ? ?Thank you ? ?Rich  ? ? ? ? ?If you have lab work done today you will be contacted with your lab results within the next 2 weeks.  If you have not heard from [place] then please contact us. The fastest way to get your results is to register for My Chart. ? ? ?IF you received an x-ray today, you will receive an invoice from [facility]. Please contact [facility] at [phone] with questions or concerns regarding your invoice.  ? ?IF you received labwork today, you will receive an invoice from [facility]. Please contact [facility] at [phone] with questions or concerns regarding your invoice.  ? ?Our billing staff will not be able to assist you with questions regarding bills from these companies. ? ?You will be contacted with the lab results as soon as they are available. The fastest way to get your results is to activate your My Chart account. Instructions are located on the last page of this paperwork. If you have not heard from [place] regarding the results in 2 weeks, please contact this office. ?  ? ? ?

## 2021-04-27 NOTE — Patient Instructions (Signed)
Medication Instructions:  ?Your physician has recommended you make the following change in your medication:  ?1.Start metoprolol succinate (Toprol XL) 25 mg, take one tablet by mouth daily ?2.Restart amlodipine (Norvasc) 50m, take one tablet by mouth daily ?*If you need a refill on your cardiac medications before your next appointment, please call your pharmacy* ? ? ?Lab Work: ?None ?If you have labs (blood work) drawn today and your tests are completely normal, you will receive your results only by: ?MyChart Message (if you have MyChart) OR ?A paper copy in the mail ?If you have any lab test that is abnormal or we need to change your treatment, we will call you to review the results. ? ? ?Testing/Procedures: ?Your physician has recommended that you have a split night sleep study. This test records several body functions during sleep, including: brain activity, eye movement, oxygen and carbon dioxide blood levels, heart rate and rhythm, breathing rate and rhythm, the flow of air through your mouth and nose, snoring, body muscle movements, and chest and belly movement.  ? ? ?Follow-Up: ?At [facility] you and your health needs are our priority.  As part of our continuing mission to provide you with exceptional heart care, we have created designated Provider Care Teams.  These Care Teams include your primary Cardiologist (physician) and Advanced Practice Providers (APPs -  Physician Assistants and Nurse Practitioners) who all work together to provide you with the care you need, when you need it. ? ? ?Your next appointment:   ?1 month(s) ? ?The format for your next appointment:   ?In Person ? ?Provider:   ?PharmD/HTN clinic { ? ?Other Instructions ?1.Stop green tea and ginseng ?2.Increase physical activity as tolerated   ?

## 2021-04-28 ENCOUNTER — Telehealth (HOSPITAL_BASED_OUTPATIENT_CLINIC_OR_DEPARTMENT_OTHER): Payer: Self-pay | Admitting: Licensed Clinical Social Worker

## 2021-04-28 NOTE — Telephone Encounter (Signed)
CSW referred to assist patient with obtaining a BP cuff. CSW contacted patient to inform cuff will be delivered to home. Patient grateful for support and assistance. CSW available as needed. Jackie Quetzally Callas, LCSW, CCSW-MCS [phone]  

## 2021-05-06 ENCOUNTER — Ambulatory Visit (HOSPITAL_COMMUNITY): Admission: RE | Admit: 2021-05-06 | Payer: 59 | Source: Home / Self Care | Admitting: Orthopedic Surgery

## 2021-05-06 ENCOUNTER — Encounter (HOSPITAL_COMMUNITY): Admission: RE | Payer: Self-pay | Source: Home / Self Care

## 2021-05-06 SURGERY — ARTHROSCOPY, KNEE, WITH MEDIAL MENISCECTOMY
Anesthesia: Choice | Site: Knee | Laterality: Right

## 2021-05-30 ENCOUNTER — Ambulatory Visit: Payer: 59

## 2021-05-30 ENCOUNTER — Telehealth: Payer: Self-pay

## 2021-05-30 NOTE — Progress Notes (Deleted)
o ? ? ? ?05/30/2021 ?[name] ?[birth year] ?[id number] ? ? ?HPI:  Rebecca Collier is a 46 y.o. female patient of Dr Oval Linsey, with a [place] below who presents today for hypertension clinic evaluation.  Patient was last seen by Coletta Memos about a month ago.  At that time her pressure had improved to 138/82 and he restarted her amlodipine and added metoprolol succ 25 mg daily.  She was given information on low sodium diet and asked to keep a home BP long.  Prior to that appointment she hd been to the ED in January for SOB and found to have a pressure of 170/110 ? ?Past Medical History: ?OSA   ?Chronic migraine   ?Vit D deficiency 11/21 - 22.0, currently no supplementation  ?obesity 04/27/20 BMI 54  ?asthma On Dupixent, albuterol MID  ?  ? ?Blood Pressure Goal:  130/80 ? ?Current Medications: metoprolol succ 25 mg qd, amlodipine 10 mg qd, hydralazine 25 mg tid ? ?Family Hx: ? ?Social Hx:  ? ?Diet:  ? ?Exercise:  ? ?Home BP readings:  ? ?Intolerances: ACEI - cough, hctz - n/v, losartan - n/v  ? ?Labs:  04/27/21:  Na 137, K 4.0, Glu 100, BUN 13, SCr 0.95, GFR 72.4 ? ? ?Wt Readings from Last 3 Encounters:  ?04/27/21 (!) 334 lb 3.2 oz (151.6 kg)  ?04/27/21 (!) 333 lb 3.2 oz (151.1 kg)  ?04/18/21 (!) 329 lb (149.2 kg)  ? ?BP Readings from Last 3 Encounters:  ?04/27/21 138/82  ?04/27/21 (!) 161/94  ?04/18/21 (!) 199/112  ? ?Pulse Readings from Last 3 Encounters:  ?04/27/21 65  ?04/27/21 83  ?04/18/21 75  ? ? ?Current Outpatient Medications  ?Medication Sig Dispense Refill  ? acetaminophen (TYLENOL) 500 MG tablet Take 1,000 mg by mouth every 6 (six) hours as needed for moderate pain.    ? albuterol (VENTOLIN HFA) 108 (90 Base) MCG/ACT inhaler Inhale 2 puffs into the lungs every 4 (four) hours as needed for wheezing or shortness of breath. 18 g 2  ? amLODipine (NORVASC) 10 MG tablet Take 1 tablet (10 mg total) by mouth daily. 90 tablet 3  ? azelastine (ASTELIN) 0.1 % nasal spray Place 1 spray into both nostrils 2 (two) times daily.  Use in each nostril as directed (Patient taking differently: Place 1 spray into both nostrils 2 (two) times daily as needed for allergies or rhinitis. Use in each nostril as directed) 30 mL 12  ? BLACK CURRANT SEED OIL PO Take 1,300 mg by mouth 2 (two) times daily.    ? buPROPion (WELLBUTRIN XL) 300 MG 24 hr tablet Take 1 tablet (300 mg total) by mouth daily. (Patient taking differently: Take 300 mg by mouth at bedtime as needed (Calm dowm).) 90 tablet 1  ? Clobetasol Prop Emollient Base 0.05 % emollient cream Apply to affected area bid 60 g 2  ? Dupilumab (DUPIXENT) 300 MG/2ML SOPN Inject 300 mg into the skin every 14 (fourteen) days. Starting at day 15 for maintenance. 2 mL 0  ? hydrALAZINE (APRESOLINE) 25 MG tablet Take 1 tablet (25 mg total) by mouth 3 (three) times daily. 270 tablet 0  ? metoprolol succinate (TOPROL XL) 25 MG 24 hr tablet Take 1 tablet (25 mg total) by mouth daily. 90 tablet 3  ? OVER THE COUNTER MEDICATION Take 450 mg by mouth daily. Burdock Root    ? QUEtiapine (SEROQUEL) 25 MG tablet TAKE 1 TABLET BY MOUTH EVERYDAY AT BEDTIME 90 tablet 1  ? traZODone (DESYREL) 50  MG tablet TAKE 0.5-1 TABLETS (25-50 MG TOTAL) BY MOUTH AT BEDTIME AS NEEDED. FOR SLEEP 90 tablet 2  ? triamcinolone cream (KENALOG) 0.1 % Apply 1 application topically daily as needed. 453 g 11  ? vitamin B-12 (CYANOCOBALAMIN) 1000 MCG tablet Take 1,000 mcg by mouth daily.    ? vitamin E 180 MG (400 UNITS) capsule Take 400 Units by mouth daily.    ? ?No current facility-administered medications for this visit.  ? ? ?Allergies  ?Allergen Reactions  ? Lisinopril Cough  ? Hydrochlorothiazide Nausea And Vomiting  ? Losartan Nausea And Vomiting  ? Penicillins Rash  ? ? ?Past Medical History:  ?Diagnosis Date  ? Allergy   ? Anxiety   ? Arthritis   ? Asthma   ? since COVID  ? Eczema   ? Hypertension   ? Obesity   ? Sciatica   ? spasms daily in left leg   ? ? ?There were no vitals taken for this visit. ? ?No problem-specific Assessment &  Plan notes found for this encounter. ? ? ?Tommy Medal PharmD CPP [facility] ?[address] ?[place], [place] [address] ?[phone] ?

## 2021-05-30 NOTE — Telephone Encounter (Signed)
Lmom to r/s missed appt  

## 2021-06-10 DIAGNOSIS — M19072 Primary osteoarthritis, left ankle and foot: Secondary | ICD-10-CM | POA: Diagnosis not present

## 2021-06-10 DIAGNOSIS — M79672 Pain in left foot: Secondary | ICD-10-CM | POA: Diagnosis not present

## 2021-06-24 ENCOUNTER — Other Ambulatory Visit: Payer: Self-pay | Admitting: Registered Nurse

## 2021-06-24 DIAGNOSIS — F411 Generalized anxiety disorder: Secondary | ICD-10-CM

## 2021-06-24 DIAGNOSIS — F5104 Psychophysiologic insomnia: Secondary | ICD-10-CM

## 2021-06-24 NOTE — Telephone Encounter (Signed)
Patient is requesting a refill of the following medications: ?Requested Prescriptions  ? ?Pending Prescriptions Disp Refills  ? QUEtiapine (SEROQUEL) 25 MG tablet [Pharmacy Med Name: QUETIAPINE FUMARATE 25 MG TAB] 90 tablet 1  ?  Sig: TAKE 1 TABLET BY MOUTH EVERYDAY AT BEDTIME  ? ? ?Date of patient request: 06/24/2021 ?Last office visit: 04/27/2021 ?Date of last refill: 90 tablets  ?Last refill amount: 12/29/2020 ?Follow up time period per chart: 11/03/2021  ?

## 2021-07-15 ENCOUNTER — Telehealth: Payer: Self-pay | Admitting: *Deleted

## 2021-07-15 NOTE — Telephone Encounter (Signed)
Left message to call [facility] to schedule her sleep study. Contact information provided.

## 2021-07-16 ENCOUNTER — Other Ambulatory Visit: Payer: Self-pay | Admitting: Registered Nurse

## 2021-07-16 DIAGNOSIS — I10 Essential (primary) hypertension: Secondary | ICD-10-CM

## 2021-07-18 ENCOUNTER — Telehealth: Payer: Self-pay

## 2021-07-18 NOTE — Telephone Encounter (Signed)
Fax received from [facility] requesting a prior authorization for the patient's Dupixent.

## 2021-07-18 NOTE — Telephone Encounter (Signed)
Prior authorization filled out and faxed back to [facility] for the patient's Dupixent.

## 2021-08-21 ENCOUNTER — Ambulatory Visit (HOSPITAL_BASED_OUTPATIENT_CLINIC_OR_DEPARTMENT_OTHER): Payer: 59 | Attending: General Practice | Admitting: Cardiovascular Disease

## 2021-11-03 ENCOUNTER — Encounter: Payer: 59 | Admitting: Registered Nurse

## 2021-11-08 ENCOUNTER — Other Ambulatory Visit: Payer: Self-pay | Admitting: Physician Assistant

## 2021-11-08 DIAGNOSIS — L2084 Intrinsic (allergic) eczema: Secondary | ICD-10-CM

## 2021-11-17 ENCOUNTER — Other Ambulatory Visit: Payer: Self-pay

## 2021-11-17 DIAGNOSIS — I10 Essential (primary) hypertension: Secondary | ICD-10-CM

## 2021-11-17 MED ORDER — HYDRALAZINE HCL 25 MG PO TABS: 25.0000 mg | ORAL_TABLET | Freq: Three times a day (TID) | ORAL | 0 refills | Status: DC

## 2021-12-22 DIAGNOSIS — L2089 Other atopic dermatitis: Secondary | ICD-10-CM | POA: Diagnosis not present

## 2021-12-26 ENCOUNTER — Encounter (HOSPITAL_BASED_OUTPATIENT_CLINIC_OR_DEPARTMENT_OTHER): Payer: Self-pay

## 2021-12-26 ENCOUNTER — Ambulatory Visit (HOSPITAL_BASED_OUTPATIENT_CLINIC_OR_DEPARTMENT_OTHER): Payer: 59 | Admitting: Family Medicine

## 2022-01-17 ENCOUNTER — Encounter (HOSPITAL_BASED_OUTPATIENT_CLINIC_OR_DEPARTMENT_OTHER): Payer: Self-pay

## 2022-01-17 ENCOUNTER — Emergency Department (HOSPITAL_BASED_OUTPATIENT_CLINIC_OR_DEPARTMENT_OTHER)
Admission: EM | Admit: 2022-01-17 | Discharge: 2022-01-17 | Disposition: A | Discharge status: Home / Self Care | Payer: 59 | Attending: Emergency Medicine | Admitting: Emergency Medicine

## 2022-01-17 ENCOUNTER — Other Ambulatory Visit: Payer: Self-pay

## 2022-01-17 DIAGNOSIS — R519 Headache, unspecified: Secondary | ICD-10-CM | POA: Diagnosis not present

## 2022-01-17 DIAGNOSIS — J069 Acute upper respiratory infection, unspecified: Secondary | ICD-10-CM | POA: Insufficient documentation

## 2022-01-17 DIAGNOSIS — Z8616 Personal history of COVID-19: Secondary | ICD-10-CM | POA: Insufficient documentation

## 2022-01-17 DIAGNOSIS — Z1152 Encounter for screening for COVID-19: Secondary | ICD-10-CM | POA: Diagnosis not present

## 2022-01-17 DIAGNOSIS — B9789 Other viral agents as the cause of diseases classified elsewhere: Secondary | ICD-10-CM | POA: Diagnosis not present

## 2022-01-17 DIAGNOSIS — R059 Cough, unspecified: Secondary | ICD-10-CM | POA: Diagnosis present

## 2022-01-17 LAB — RESP PANEL BY RT-PCR (FLU A&B, COVID) ARPGX2
Influenza A by PCR: NEGATIVE
Influenza B by PCR: NEGATIVE
SARS Coronavirus 2 by RT PCR: NEGATIVE

## 2022-01-17 MED ORDER — BENZONATATE 100 MG PO CAPS: 100.0000 mg | ORAL_CAPSULE | Freq: Three times a day (TID) | ORAL | 0 refills | Status: DC

## 2022-01-17 NOTE — ED Notes (Signed)
Pt given discharge instructions and reviewed prescriptions. Opportunities given for questions. Pt verbalizes understanding. Verbon Giangregorio R, RN 

## 2022-01-17 NOTE — Discharge Instructions (Signed)
Please follow-up with your primary care doctor as needed if you develop severe neck stiffness, confusion, worst headache of your life please return to the ER.  Take Tylenol ibuprofen for your symptoms, and please isolate from your elderly family members given concerns for respiratory illness.

## 2022-01-17 NOTE — ED Notes (Signed)
Pt c/o generalized pain/soreness, achy, headache and just "feeling blah".

## 2022-01-17 NOTE — ED Triage Notes (Signed)
Patient here POV from Home.  Endorses URI Symptoms such as Subjective Fever, Cough, Aches, Chills that began today.  NAD Noted During Triage. A&Ox4. GCS 15. Ambulatory.

## 2022-01-17 NOTE — ED Provider Notes (Signed)
[facility] EMERGENCY DEPT Provider Note   CSN: [id number] Arrival date & time: 01/17/22  1431     History  Chief Complaint  Patient presents with   Cough    Rebecca Collier is a 46 y.o. female, who presents to the ED secondary to fever of up to 101, dry cough, aches, and chills that started today.  Denies any shortness of breath or chest pain.  Notes that she does not smoke, and has not had any wheezing.  Just feels very bad.  Endorses headache, but no nuchal rigidity.      Home Medications Prior to Admission medications   Medication Sig Start Date End Date Taking? Authorizing Provider  benzonatate (TESSALON) 100 MG capsule Take 1 capsule (100 mg total) by mouth every 8 (eight) hours. 01/17/22  Yes Anastasio Wogan L, PA  QUEtiapine (SEROQUEL) 25 MG tablet TAKE 1 TABLET BY MOUTH EVERYDAY AT BEDTIME 06/24/21   Janeece Agee, NP  acetaminophen (TYLENOL) 500 MG tablet Take 1,000 mg by mouth every 6 (six) hours as needed for moderate pain.    [provider]  albuterol (VENTOLIN HFA) 108 (90 Base) MCG/ACT inhaler Inhale 2 puffs into the lungs every 4 (four) hours as needed for wheezing or shortness of breath. 03/08/21   Janeece Agee, NP  amLODipine (NORVASC) 10 MG tablet Take 1 tablet (10 mg total) by mouth daily. 04/27/21   Ronney Asters, NP  azelastine (ASTELIN) 0.1 % nasal spray Place 1 spray into both nostrils 2 (two) times daily. Use in each nostril as directed Patient taking differently: Place 1 spray into both nostrils 2 (two) times daily as needed for allergies or rhinitis. Use in each nostril as directed 03/08/21   Janeece Agee, NP  BLACK CURRANT SEED OIL PO Take 1,300 mg by mouth 2 (two) times daily.    [provider]  buPROPion (WELLBUTRIN XL) 300 MG 24 hr tablet Take 1 tablet (300 mg total) by mouth daily. Patient taking differently: Take 300 mg by mouth at bedtime as needed (Calm dowm). 01/02/20   Janeece Agee, NP  Clobetasol Prop Emollient  Base 0.05 % emollient cream Apply to affected area bid 04/23/19   Royal Hawthorn, NP  Dupilumab (DUPIXENT) 300 MG/2ML SOPN Inject 300 mg into the skin every 14 (fourteen) days. Starting at day 15 for maintenance. 04/12/21   Sheffield, Judye Bos, PA-C  hydrALAZINE (APRESOLINE) 25 MG tablet Take 1 tablet (25 mg total) by mouth 3 (three) times daily. 11/17/21   Sheliah Hatch, MD  metoprolol succinate (TOPROL XL) 25 MG 24 hr tablet Take 1 tablet (25 mg total) by mouth daily. 04/27/21   Ronney Asters, NP  OVER THE COUNTER MEDICATION Take 450 mg by mouth daily. Burdock Root    [provider]  traZODone (DESYREL) 50 MG tablet TAKE 0.5-1 TABLETS (25-50 MG TOTAL) BY MOUTH AT BEDTIME AS NEEDED. FOR SLEEP 04/12/21   Janeece Agee, NP  triamcinolone cream (KENALOG) 0.1 % Apply 1 application topically daily as needed. 04/12/21   Sheffield, Judye Bos, PA-C  vitamin B-12 (CYANOCOBALAMIN) 1000 MCG tablet Take 1,000 mcg by mouth daily.    [provider]  vitamin E 180 MG (400 UNITS) capsule Take 400 Units by mouth daily.    [provider]      Allergies    Lisinopril, Hydrochlorothiazide, Losartan, and Penicillins    Review of Systems   Review of Systems  Constitutional:  Positive for fever.  Respiratory:  Positive for cough.  Negative for shortness of breath.   Cardiovascular:  Negative for chest pain.    Physical Exam Updated Vital Signs BP (!) 157/100 (BP Location: Right Arm)   Pulse 84   Temp 98.9 F (37.2 C) (Oral)   Resp 18   Ht 5\' 6"  (1.676 m)   Wt (!) 151.6 kg   SpO2 96%   BMI 53.94 kg/m  Physical Exam Vitals and nursing note reviewed.  Constitutional:      General: She is not in acute distress.    Appearance: She is well-developed.  HENT:     Head: Normocephalic and atraumatic.  Eyes:     Conjunctiva/sclera: Conjunctivae normal.  Cardiovascular:     Rate and Rhythm: Normal rate and regular rhythm.     Heart sounds: No murmur heard. Pulmonary:      Effort: Pulmonary effort is normal. No respiratory distress.     Breath sounds: Normal breath sounds.  Abdominal:     Palpations: Abdomen is soft.     Tenderness: There is no abdominal tenderness.  Musculoskeletal:        General: No swelling.     Cervical back: Neck supple.  Skin:    General: Skin is warm and dry.     Capillary Refill: Capillary refill takes less than 2 seconds.  Neurological:     Mental Status: She is alert.  Psychiatric:        Mood and Affect: Mood normal.     ED Results / Procedures / Treatments   Labs (all labs ordered are listed, but only abnormal results are displayed) Labs Reviewed  RESP PANEL BY RT-PCR (FLU A&B, COVID) ARPGX2    EKG None  Radiology No results found.  Procedures Procedures   Medications Ordered in ED Medications - No data to display  ED Course/ Medical Decision Making/ A&P                           Medical Decision Making Patient is a 46 year old female, here with upper respiratory infectious symptoms, COVID/flu negative.  Is well-appearing on exam, no wheezing or crackles on exam.  She does not have any nuchal rigidity.  She is well-appearing and just has a cough.  Discussed with patient, concern for URI, we will send Tessalon Perles to pharmacy for help with dry cough.  Discussed return precautions, patient voiced understanding.  Risk Prescription drug management.    Final Clinical Impression(s) / ED Diagnoses Final diagnoses:  Viral upper respiratory illness  Nonintractable headache, unspecified chronicity pattern, unspecified headache type    Rx / DC Orders ED Discharge Orders          Ordered    benzonatate (TESSALON) 100 MG capsule  Every 8 hours        01/17/22 1639              Erilyn Pearman 01/19/22, PA 01/17/22 1649    01/19/22, MD 01/17/22 [phone]

## 2022-01-18 ENCOUNTER — Other Ambulatory Visit: Payer: Self-pay

## 2022-01-18 ENCOUNTER — Emergency Department (HOSPITAL_COMMUNITY)
Admission: EM | Admit: 2022-01-18 | Discharge: 2022-01-18 | Disposition: A | Discharge status: Home / Self Care | Payer: 59 | Attending: Emergency Medicine | Admitting: Emergency Medicine

## 2022-01-18 DIAGNOSIS — J069 Acute upper respiratory infection, unspecified: Secondary | ICD-10-CM | POA: Diagnosis not present

## 2022-01-18 DIAGNOSIS — J45909 Unspecified asthma, uncomplicated: Secondary | ICD-10-CM | POA: Insufficient documentation

## 2022-01-18 DIAGNOSIS — I10 Essential (primary) hypertension: Secondary | ICD-10-CM | POA: Diagnosis not present

## 2022-01-18 DIAGNOSIS — Z20822 Contact with and (suspected) exposure to covid-19: Secondary | ICD-10-CM | POA: Insufficient documentation

## 2022-01-18 DIAGNOSIS — Z79899 Other long term (current) drug therapy: Secondary | ICD-10-CM | POA: Diagnosis not present

## 2022-01-18 DIAGNOSIS — E669 Obesity, unspecified: Secondary | ICD-10-CM | POA: Diagnosis not present

## 2022-01-18 DIAGNOSIS — M791 Myalgia, unspecified site: Secondary | ICD-10-CM | POA: Diagnosis present

## 2022-01-18 LAB — RESP PANEL BY RT-PCR (FLU A&B, COVID) ARPGX2
Influenza A by PCR: POSITIVE — AB
Influenza B by PCR: NEGATIVE
SARS Coronavirus 2 by RT PCR: NEGATIVE

## 2022-01-18 MED ORDER — KETOROLAC TROMETHAMINE 15 MG/ML IJ SOLN
15.0000 mg | Freq: Once | INTRAMUSCULAR | Status: AC
Start: 2022-01-18 — End: 2022-01-18
  Administered 2022-01-18: 15 mg via INTRAMUSCULAR
  Filled 2022-01-18: qty 1

## 2022-01-18 MED ORDER — HYDROCODONE-ACETAMINOPHEN 5-325 MG PO TABS
1.0000 | ORAL_TABLET | Freq: Four times a day (QID) | ORAL | 0 refills | Status: DC | PRN
Start: 2022-01-18 — End: 2022-01-27

## 2022-01-18 NOTE — ED Provider Notes (Signed)
[facility]-EMERGENCY DEPT Provider Note   CSN: [id number] Arrival date & time: 01/18/22  1521     History  Chief Complaint  Patient presents with   Generalized Body Aches    Rebecca Collier is a 46 y.o. female.  HPI Patient presents with myalgias.  Aching pains.  Seen yesterday at [facility] for the same.  URI symptoms and cough.  States she continues to feel bad and states she was told to come back in if she was not feeling better.  States she is still feeling bad.  States she aches all over.  States she thinks she got it at work.   Past Medical History:  Diagnosis Date   Allergy    Anxiety    Arthritis    Asthma    since COVID   Eczema    Hypertension    Obesity    Sciatica    spasms daily in left leg     Home Medications Prior to Admission medications   Medication Sig Start Date End Date Taking? Authorizing Provider  HYDROcodone-acetaminophen (NORCO/VICODIN) 5-325 MG tablet Take 1-2 tablets by mouth every 6 (six) hours as needed for moderate pain. 01/18/22  Yes Benjiman Core, MD  QUEtiapine (SEROQUEL) 25 MG tablet TAKE 1 TABLET BY MOUTH EVERYDAY AT BEDTIME 06/24/21   Janeece Agee, NP  acetaminophen (TYLENOL) 500 MG tablet Take 1,000 mg by mouth every 6 (six) hours as needed for moderate pain.    [provider]  albuterol (VENTOLIN HFA) 108 (90 Base) MCG/ACT inhaler Inhale 2 puffs into the lungs every 4 (four) hours as needed for wheezing or shortness of breath. 03/08/21   Janeece Agee, NP  amLODipine (NORVASC) 10 MG tablet Take 1 tablet (10 mg total) by mouth daily. 04/27/21   Ronney Asters, NP  azelastine (ASTELIN) 0.1 % nasal spray Place 1 spray into both nostrils 2 (two) times daily. Use in each nostril as directed Patient taking differently: Place 1 spray into both nostrils 2 (two) times daily as needed for allergies or rhinitis. Use in each nostril as directed 03/08/21   Janeece Agee, NP  benzonatate (TESSALON) 100 MG capsule  Take 1 capsule (100 mg total) by mouth every 8 (eight) hours. 01/17/22   Small, Brooke L, PA  BLACK CURRANT SEED OIL PO Take 1,300 mg by mouth 2 (two) times daily.    [provider]  buPROPion (WELLBUTRIN XL) 300 MG 24 hr tablet Take 1 tablet (300 mg total) by mouth daily. Patient taking differently: Take 300 mg by mouth at bedtime as needed (Calm dowm). 01/02/20   Janeece Agee, NP  Clobetasol Prop Emollient Base 0.05 % emollient cream Apply to affected area bid 04/23/19   Royal Hawthorn, NP  Dupilumab (DUPIXENT) 300 MG/2ML SOPN Inject 300 mg into the skin every 14 (fourteen) days. Starting at day 15 for maintenance. 04/12/21   Sheffield, Judye Bos, PA-C  hydrALAZINE (APRESOLINE) 25 MG tablet Take 1 tablet (25 mg total) by mouth 3 (three) times daily. 11/17/21   Sheliah Hatch, MD  metoprolol succinate (TOPROL XL) 25 MG 24 hr tablet Take 1 tablet (25 mg total) by mouth daily. 04/27/21   Ronney Asters, NP  OVER THE COUNTER MEDICATION Take 450 mg by mouth daily. Burdock Root    [provider]  traZODone (DESYREL) 50 MG tablet TAKE 0.5-1 TABLETS (25-50 MG TOTAL) BY MOUTH AT BEDTIME AS NEEDED. FOR SLEEP 04/12/21   Janeece Agee, NP  triamcinolone cream (KENALOG) 0.1 %  Apply 1 application topically daily as needed. 04/12/21   Sheffield, Judye Bos, PA-C  vitamin B-12 (CYANOCOBALAMIN) 1000 MCG tablet Take 1,000 mcg by mouth daily.    [provider]  vitamin E 180 MG (400 UNITS) capsule Take 400 Units by mouth daily.    [provider]      Allergies    Lisinopril, Hydrochlorothiazide, Losartan, and Penicillins    Review of Systems   Review of Systems  Physical Exam Updated Vital Signs BP (!) 113/94   Pulse 95   Temp 100 F (37.8 C) (Oral)   Resp 20   SpO2 95%  Physical Exam Vitals and nursing note reviewed.  Constitutional:      Appearance: She is obese.  HENT:     Head: Atraumatic.     Mouth/Throat:     Pharynx: No oropharyngeal exudate or  posterior oropharyngeal erythema.  Cardiovascular:     Rate and Rhythm: Regular rhythm.  Pulmonary:     Breath sounds: No wheezing or rhonchi.     Comments: Occasional cough. Skin:    General: Skin is warm.     Capillary Refill: Capillary refill takes less than 2 seconds.  Neurological:     Mental Status: She is alert.     ED Results / Procedures / Treatments   Labs (all labs ordered are listed, but only abnormal results are displayed) Labs Reviewed  RESP PANEL BY RT-PCR (FLU A&B, COVID) ARPGX2    EKG None  Radiology No results found.  Procedures Procedures    Medications Ordered in ED Medications  ketorolac (TORADOL) 15 MG/ML injection 15 mg (has no administration in time range)    ED Course/ Medical Decision Making/ A&P                           Medical Decision Making Risk Prescription drug management.   Patient with URI symptoms.  Recently seen for same.  Negative COVID test yesterday.  However today still feeling bad.  Lungs are clear.  Doubt pneumonia.  Do not think we need x-ray at this time.  However will repeat COVID testing and will and flu testing.  Although patient states she would not want Paxlovid due to side effects that she has had before.  Will treat symptomatically with some pain medicines.  Will attempt to keep patient hydrated at home.  Does not appear to require admission to hospital.        Final Clinical Impression(s) / ED Diagnoses Final diagnoses:  Upper respiratory tract infection, unspecified type    Rx / DC Orders ED Discharge Orders          Ordered    HYDROcodone-acetaminophen (NORCO/VICODIN) 5-325 MG tablet  Every 6 hours PRN        01/18/22 1853              Benjiman Core, MD 01/18/22 1856

## 2022-01-18 NOTE — Discharge Instructions (Addendum)
Your flu and COVID test are pending.  You can follow them on MyChart.  Take the medicines to help with the symptoms.

## 2022-01-18 NOTE — ED Triage Notes (Incomplete)
Patient c/o body aches, cough, chills started 2 days ago. Pt report temperature of 101.6 at home. Pt denies N/V/D.

## 2022-01-27 ENCOUNTER — Ambulatory Visit: Payer: 59 | Admitting: Internal Medicine

## 2022-01-27 ENCOUNTER — Encounter: Payer: Self-pay | Admitting: Internal Medicine

## 2022-01-27 VITALS — BP 138/78 | HR 68 | Temp 98.2°F | Resp 12 | Ht 66.0 in | Wt 351.4 lb

## 2022-01-27 DIAGNOSIS — R0683 Snoring: Secondary | ICD-10-CM | POA: Diagnosis not present

## 2022-01-27 DIAGNOSIS — E8881 Metabolic syndrome: Secondary | ICD-10-CM

## 2022-01-27 DIAGNOSIS — J45909 Unspecified asthma, uncomplicated: Secondary | ICD-10-CM

## 2022-01-27 DIAGNOSIS — Z800 Family history of malignant neoplasm of digestive organs: Secondary | ICD-10-CM | POA: Diagnosis not present

## 2022-01-27 DIAGNOSIS — E559 Vitamin D deficiency, unspecified: Secondary | ICD-10-CM | POA: Diagnosis not present

## 2022-01-27 DIAGNOSIS — M1990 Unspecified osteoarthritis, unspecified site: Secondary | ICD-10-CM

## 2022-01-27 DIAGNOSIS — Z6841 Body Mass Index (BMI) 40.0 and over, adult: Secondary | ICD-10-CM

## 2022-01-27 DIAGNOSIS — R739 Hyperglycemia, unspecified: Secondary | ICD-10-CM

## 2022-01-27 DIAGNOSIS — Z01419 Encounter for gynecological examination (general) (routine) without abnormal findings: Secondary | ICD-10-CM | POA: Diagnosis not present

## 2022-01-27 DIAGNOSIS — Z119 Encounter for screening for infectious and parasitic diseases, unspecified: Secondary | ICD-10-CM

## 2022-01-27 DIAGNOSIS — Z1211 Encounter for screening for malignant neoplasm of colon: Secondary | ICD-10-CM

## 2022-01-27 DIAGNOSIS — F411 Generalized anxiety disorder: Secondary | ICD-10-CM | POA: Diagnosis not present

## 2022-01-27 DIAGNOSIS — F5104 Psychophysiologic insomnia: Secondary | ICD-10-CM | POA: Diagnosis not present

## 2022-01-27 DIAGNOSIS — L2084 Intrinsic (allergic) eczema: Secondary | ICD-10-CM

## 2022-01-27 DIAGNOSIS — G4700 Insomnia, unspecified: Secondary | ICD-10-CM

## 2022-01-27 DIAGNOSIS — G43709 Chronic migraine without aura, not intractable, without status migrainosus: Secondary | ICD-10-CM

## 2022-01-27 DIAGNOSIS — R110 Nausea: Secondary | ICD-10-CM

## 2022-01-27 DIAGNOSIS — M1712 Unilateral primary osteoarthritis, left knee: Secondary | ICD-10-CM

## 2022-01-27 DIAGNOSIS — R69 Illness, unspecified: Secondary | ICD-10-CM | POA: Diagnosis not present

## 2022-01-27 DIAGNOSIS — I10 Essential (primary) hypertension: Secondary | ICD-10-CM

## 2022-01-27 DIAGNOSIS — E538 Deficiency of other specified B group vitamins: Secondary | ICD-10-CM

## 2022-01-27 MED ORDER — SUMATRIPTAN SUCCINATE 50 MG PO TABS: 50.0000 mg | ORAL_TABLET | Freq: Every day | ORAL | 0 refills | Status: AC | PRN

## 2022-01-27 MED ORDER — SEMAGLUTIDE-WEIGHT MANAGEMENT 1.7 MG/0.75ML ~~LOC~~ SOAJ: 1.7000 mg | SUBCUTANEOUS | 0 refills | Status: DC

## 2022-01-27 MED ORDER — SEMAGLUTIDE-WEIGHT MANAGEMENT 0.25 MG/0.5ML ~~LOC~~ SOAJ: 0.2500 mg | SUBCUTANEOUS | 0 refills | Status: AC

## 2022-01-27 MED ORDER — ONDANSETRON 4 MG PO TBDP: 4.0000 mg | ORAL_TABLET | Freq: Three times a day (TID) | ORAL | 0 refills | Status: AC | PRN

## 2022-01-27 MED ORDER — QUETIAPINE FUMARATE 25 MG PO TABS: ORAL_TABLET | ORAL | 3 refills | Status: DC

## 2022-01-27 MED ORDER — MELOXICAM 7.5 MG PO TABS: ORAL_TABLET | ORAL | 3 refills | Status: DC

## 2022-01-27 MED ORDER — SEMAGLUTIDE-WEIGHT MANAGEMENT 2.4 MG/0.75ML ~~LOC~~ SOAJ: 2.4000 mg | SUBCUTANEOUS | 0 refills | Status: DC

## 2022-01-27 MED ORDER — ALBUTEROL SULFATE HFA 108 (90 BASE) MCG/ACT IN AERS: 2.0000 | INHALATION_SPRAY | RESPIRATORY_TRACT | 2 refills | Status: DC | PRN

## 2022-01-27 MED ORDER — SEMAGLUTIDE-WEIGHT MANAGEMENT 0.5 MG/0.5ML ~~LOC~~ SOAJ: 0.5000 mg | SUBCUTANEOUS | 0 refills | Status: AC

## 2022-01-27 MED ORDER — METOPROLOL SUCCINATE ER 25 MG PO TB24: 25.0000 mg | ORAL_TABLET | Freq: Every day | ORAL | 3 refills | Status: DC

## 2022-01-27 MED ORDER — SEMAGLUTIDE-WEIGHT MANAGEMENT 1 MG/0.5ML ~~LOC~~ SOAJ: 1.0000 mg | SUBCUTANEOUS | 0 refills | Status: DC

## 2022-01-27 MED ORDER — AMLODIPINE BESYLATE 10 MG PO TABS: 10.0000 mg | ORAL_TABLET | Freq: Every day | ORAL | 3 refills | Status: DC

## 2022-01-27 MED ORDER — VITAMIN B-12 1000 MCG PO TABS: 1000.0000 ug | ORAL_TABLET | Freq: Every day | ORAL | 3 refills | Status: AC

## 2022-01-27 MED ORDER — HYDRALAZINE HCL 25 MG PO TABS: 25.0000 mg | ORAL_TABLET | Freq: Three times a day (TID) | ORAL | 0 refills | Status: DC

## 2022-01-27 MED ORDER — TRAZODONE HCL 50 MG PO TABS: ORAL_TABLET | ORAL | 2 refills | Status: DC

## 2022-01-27 MED ORDER — TRIAMCINOLONE ACETONIDE 0.1 % EX CREA: 1.0000 | TOPICAL_CREAM | Freq: Every day | CUTANEOUS | 11 refills | Status: AC | PRN

## 2022-01-27 NOTE — Patient Instructions (Addendum)
It was a pleasure seeing you today!  Your health and satisfaction are my top priorities. If you believe your experience today was worthy of a 5-star rating, I'd be grateful for your feedback! Lula Olszewski, MD   AT CHECKOUT:  []    Sign release of information at the check out desk for: Any records we need for your care and to be your medical home  []    Schedule next appointment:  Please schedule a rouitne follow up appointment 3-6 months from now at checkout.  Schedule lab work visit at checkout If you are not doing well:  Return to the office sooner  Please bring all your medicines to each appointment If your condition begins to worsen or become severe:  GO to the ER   CLINICAL PLAN REMINDERS: Your checklist to help you remember today's clinical plan  []    Gl getting Wegovy, don't take it if they fill it until you have 2 month(s) suply  []   (Optional):  Review your clinical notes on MyChart after they are completed.     Today's draft of the physician documented plan for today's visit: (final revisions will be visible on MyChart chart later) GAD (generalized anxiety disorder) -     TSH; Future -     Ambulatory referral to Sleep Studies -     CBC; Future -     Vitamin B12; Future -     ACTH; Future -     Cortisol; Future -     Comprehensive metabolic panel; Future -     Hemoglobin A1c; Future -     QUEtiapine Fumarate; TAKE 1 TABLET BY MOUTH EVERYDAY AT BEDTIME  Dispense: 90 tablet; Refill: 3  Colon cancer screening -     Cologuard -     TSH; Future -     Ambulatory referral to Sleep Studies -     CBC; Future -     Vitamin B12; Future -     ACTH; Future -     Cortisol; Future -     Comprehensive metabolic panel; Future -     Hemoglobin A1c; Future  Metabolic syndrome X -     TSH; Future -     Ambulatory referral to Sleep Studies -     CBC; Future -     Vitamin B12; Future -     ACTH; Future -     Cortisol; Future -     Comprehensive metabolic panel; Future -      Hemoglobin A1c; Future  Morbid obesity (HCC) -     TSH; Future -     Ambulatory referral to Sleep Studies -     CBC; Future -     Vitamin B12; Future -     ACTH; Future -     Cortisol; Future -     Comprehensive metabolic panel; Future -     Hemoglobin A1c; Future  Family history of colon cancer in mother (age 3) -     TSH; Future -     Ambulatory referral to Sleep Studies -     CBC; Future -     Vitamin B12; Future -     ACTH; Future -     Cortisol; Future -     Comprehensive metabolic panel; Future -     Hemoglobin A1c; Future  Class 3 severe obesity due to excess calories with serious comorbidity and body mass index (BMI) of 50.0 to 59.9 in adult [facility]) Assessment & Plan:  We had a conversation today about healthy eating habits, exercise, calorie and carb goals for sustainable and successful weight loss. I gave her caloric and protein daily intake values and encouraged increasing fiber and water intake. I discussed weight loss medications that could be used with consideration of her specific past medical history and financial limitations.  I explained to her  the 3 things that almost all people who lose weight and keep it off do: 1.  Cut out all sugary beverages and all calorie-containing beverages  especially try gold peak diet tea and stop sweet tea. 2.  Resistance training (cardiac exercise not as important for maintaining muscle mass) 3.  Actually counting calories and carbs and deciding against foods that have high amounts - I.e. Check nutrition labels.    Orders: -     TSH; Future -     Ambulatory referral to Sleep Studies -     Semaglutide-Weight Management; Inject 0.25 mg into the skin once a week for 28 days.  Dispense: 2 mL; Refill: 0 -     Semaglutide-Weight Management; Inject 0.5 mg into the skin once a week for 28 days.  Dispense: 2 mL; Refill: 0 -     Semaglutide-Weight Management; Inject 1 mg into the skin once a week for 28 days.  Dispense: 2 mL; Refill: 0 -      Semaglutide-Weight Management; Inject 1.7 mg into the skin once a week for 28 days.  Dispense: 3 mL; Refill: 0 -     Semaglutide-Weight Management; Inject 2.4 mg into the skin once a week for 28 days.  Dispense: 3 mL; Refill: 0 -     CBC; Future -     Vitamin B12; Future -     ACTH; Future -     Cortisol; Future -     Comprehensive metabolic panel; Future -     Hemoglobin A1c; Future  Psychophysiological insomnia -     TSH; Future -     Ambulatory referral to Sleep Studies -     CBC; Future -     Vitamin B12; Future -     ACTH; Future -     Cortisol; Future -     Comprehensive metabolic panel; Future -     Hemoglobin A1c; Future -     traZODone HCl; TAKE 0.5-1 TABLETS (25-50 MG TOTAL) BY MOUTH AT BEDTIME AS NEEDED. FOR SLEEP  Dispense: 90 tablet; Refill: 2 -     QUEtiapine Fumarate; TAKE 1 TABLET BY MOUTH EVERYDAY AT BEDTIME  Dispense: 90 tablet; Refill: 3  Chronic migraine without aura without status migrainosus, not intractable -     TSH; Future -     Ambulatory referral to Sleep Studies -     SUMAtriptan Succinate; Take 1 tablet (50 mg total) by mouth daily as needed for migraine. May repeat in 2 hours if headache persists or recurs.  Dispense: 10 tablet; Refill: 0 -     CBC; Future -     Vitamin B12; Future -     ACTH; Future -     Cortisol; Future -     Comprehensive metabolic panel; Future -     Hemoglobin A1c; Future  Vitamin D deficiency -     TSH; Future -     Ambulatory referral to Sleep Studies -     CBC; Future -     Vitamin B12; Future -     ACTH; Future -     Cortisol; Future -  Comprehensive metabolic panel; Future -     Hemoglobin A1c; Future -     VITAMIN D 25 Hydroxy (Vit-D Deficiency, Fractures); Future  Elevated blood sugar -     TSH; Future -     Ambulatory referral to Sleep Studies -     CBC; Future -     Vitamin B12; Future -     ACTH; Future -     Cortisol; Future -     Comprehensive metabolic panel; Future -     Hemoglobin A1c;  Future  Snoring -     Ambulatory referral to Sleep Studies  Screening examination for infectious disease -     HIV Antibody (routine testing w rflx); Future -     Hepatitis C antibody; Future  Well woman exam with routine gynecological exam -     Ambulatory referral to Gynecology  Nausea -     Ondansetron; Take 1 tablet (4 mg total) by mouth every 8 (eight) hours as needed for nausea or vomiting (for nausea from wegovy or other source).  Dispense: 20 tablet; Refill: 0  Essential hypertension, benign -     amLODIPine Besylate; Take 1 tablet (10 mg total) by mouth daily.  Dispense: 90 tablet; Refill: 3 -     hydrALAZINE HCl; Take 1 tablet (25 mg total) by mouth 3 (three) times daily.  Dispense: 270 tablet; Refill: 0 -     Metoprolol Succinate ER; Take 1 tablet (25 mg total) by mouth daily.  Dispense: 90 tablet; Refill: 3  Intrinsic (allergic) eczema -     Triamcinolone Acetonide; Apply 1 Application topically daily as needed.  Dispense: 453 g; Refill: 11  Intrinsic atopic dermatitis -     Triamcinolone Acetonide; Apply 1 Application topically daily as needed.  Dispense: 453 g; Refill: 11  Insomnia, unspecified type -     Vitamin B-12; Take 1 tablet (1,000 mcg total) by mouth daily.  Dispense: 90 tablet; Refill: 3  Uncomplicated asthma, unspecified asthma severity, unspecified whether persistent -     Albuterol Sulfate HFA; Inhale 2 puffs into the lungs every 4 (four) hours as needed for wheezing or shortness of breath.  Dispense: 18 g; Refill: 2  B12 deficiency -     Vitamin B-12; Take 1 tablet (1,000 mcg total) by mouth daily.  Dispense: 90 tablet; Refill: 3  Arthritis -     Meloxicam; TAKE 1 TABLET BY MOUTH TWICE A DAY FOR 2 WEEKS AND THEN AS NEEDED  Dispense: 90 tablet; Refill: 3  Osteoarthritis of left knee, unspecified osteoarthritis type    QUESTIONS & CONCERNS: CLINICAL: please contact me via phone [phone] OR MyChart messaging  LAB & IMAGING:   We will call you  if the results are significantly abnormal or you don't use MyChart.  Most normal results will be posted to MyChart immediately and have a clinical review message by Dr. Jon Billings posted within 2-3 business days.   If you have not heard from [place] regarding the results in 2 weeks OR if you need priority reporting, please contact this office. MYCHART:  The fastest way to get your results and easiest way to stay in touch with [place] is by activating your My Chart account. Instructions are located on the last page of this paperwork.  BILLING: xray and lab orders are billed from separate companies and questions./concerns should be directed to the invoicing company.  For visit charges please discuss with our administrative services COMPLAINTS:  please let Dr. Jon Billings know or  see the [employer] Administrator - [facility], by asking at the front desk: we want you to be satisfied with every experience and we would be grateful for the opportunity to address any problems

## 2022-01-27 NOTE — Progress Notes (Signed)
[facility]  Phone: [phone]  New patient visit  Visit Date: 01/27/2022 Patient: Rebecca Collier   DOB: [birth year]   46 y.o. Female  MRN: [id number]  Today's healthcare provider: Lula Olszewskiyan G Oakes Mccready, MD  Assessment and Plan:   Rebecca Collier was seen today for transfer of care.  GAD (generalized anxiety disorder) -     TSH; Future -     Ambulatory referral to Sleep Studies -     CBC; Future -     Vitamin B12; Future -     ACTH; Future -     Cortisol; Future -     Comprehensive metabolic panel; Future -     Hemoglobin A1c; Future -     QUEtiapine Fumarate; TAKE 1 TABLET BY MOUTH EVERYDAY AT BEDTIME  Dispense: 90 tablet; Refill: 3  Colon cancer screening -     Cologuard -     TSH; Future -     Ambulatory referral to Sleep Studies -     CBC; Future -     Vitamin B12; Future -     ACTH; Future -     Cortisol; Future -     Comprehensive metabolic panel; Future -     Hemoglobin A1c; Future  Metabolic syndrome X -     TSH; Future -     Ambulatory referral to Sleep Studies -     CBC; Future -     Vitamin B12; Future -     ACTH; Future -     Cortisol; Future -     Comprehensive metabolic panel; Future -     Hemoglobin A1c; Future  Morbid obesity (HCC) -     TSH; Future -     Ambulatory referral to Sleep Studies -     CBC; Future -     Vitamin B12; Future -     ACTH; Future -     Cortisol; Future -     Comprehensive metabolic panel; Future -     Hemoglobin A1c; Future  Family history of colon cancer in mother (age 46) -     TSH; Future -     Ambulatory referral to Sleep Studies -     CBC; Future -     Vitamin B12; Future -     ACTH; Future -     Cortisol; Future -     Comprehensive metabolic panel; Future -     Hemoglobin A1c; Future  Class 3 severe obesity due to excess calories with serious comorbidity and body mass index (BMI) of 50.0 to 59.9 in adult [facility](HCC) Assessment & Plan: We had a conversation today about healthy eating habits, exercise,  calorie and carb goals for sustainable and successful weight loss. I gave her caloric and protein daily intake values and encouraged increasing fiber and water intake. I discussed weight loss medications that could be used with consideration of her specific past medical history and financial limitations.  I explained to her  the 3 things that almost all people who lose weight and keep it off do: 1.  Cut out all sugary beverages and all calorie-containing beverages  especially try gold peak diet tea and stop sweet tea. 2.  Resistance training (cardiac exercise not as important for maintaining muscle mass) 3.  Actually counting calories and carbs and deciding against foods that have high amounts - I.e. Check nutrition labels.    Orders: -     TSH; Future -     Ambulatory referral  to Sleep Studies -     Semaglutide-Weight Management; Inject 0.25 mg into the skin once a week for 28 days.  Dispense: 2 mL; Refill: 0 -     Semaglutide-Weight Management; Inject 0.5 mg into the skin once a week for 28 days.  Dispense: 2 mL; Refill: 0 -     Semaglutide-Weight Management; Inject 1 mg into the skin once a week for 28 days.  Dispense: 2 mL; Refill: 0 -     Semaglutide-Weight Management; Inject 1.7 mg into the skin once a week for 28 days.  Dispense: 3 mL; Refill: 0 -     Semaglutide-Weight Management; Inject 2.4 mg into the skin once a week for 28 days.  Dispense: 3 mL; Refill: 0 -     CBC; Future -     Vitamin B12; Future -     ACTH; Future -     Cortisol; Future -     Comprehensive metabolic panel; Future -     Hemoglobin A1c; Future  Psychophysiological insomnia -     TSH; Future -     Ambulatory referral to Sleep Studies -     CBC; Future -     Vitamin B12; Future -     ACTH; Future -     Cortisol; Future -     Comprehensive metabolic panel; Future -     Hemoglobin A1c; Future -     traZODone HCl; TAKE 0.5-1 TABLETS (25-50 MG TOTAL) BY MOUTH AT BEDTIME AS NEEDED. FOR SLEEP  Dispense: 90 tablet;  Refill: 2 -     QUEtiapine Fumarate; TAKE 1 TABLET BY MOUTH EVERYDAY AT BEDTIME  Dispense: 90 tablet; Refill: 3  Chronic migraine without aura without status migrainosus, not intractable -     TSH; Future -     Ambulatory referral to Sleep Studies -     SUMAtriptan Succinate; Take 1 tablet (50 mg total) by mouth daily as needed for migraine. May repeat in 2 hours if headache persists or recurs.  Dispense: 10 tablet; Refill: 0 -     CBC; Future -     Vitamin B12; Future -     ACTH; Future -     Cortisol; Future -     Comprehensive metabolic panel; Future -     Hemoglobin A1c; Future  Vitamin D deficiency -     TSH; Future -     Ambulatory referral to Sleep Studies -     CBC; Future -     Vitamin B12; Future -     ACTH; Future -     Cortisol; Future -     Comprehensive metabolic panel; Future -     Hemoglobin A1c; Future -     VITAMIN D 25 Hydroxy (Vit-D Deficiency, Fractures); Future  Elevated blood sugar -     TSH; Future -     Ambulatory referral to Sleep Studies -     CBC; Future -     Vitamin B12; Future -     ACTH; Future -     Cortisol; Future -     Comprehensive metabolic panel; Future -     Hemoglobin A1c; Future  Snoring -     Ambulatory referral to Sleep Studies  Screening examination for infectious disease -     HIV Antibody (routine testing w rflx); Future -     Hepatitis C antibody; Future  Well woman exam with routine gynecological exam -     Ambulatory referral to  Gynecology  Nausea -     Ondansetron; Take 1 tablet (4 mg total) by mouth every 8 (eight) hours as needed for nausea or vomiting (for nausea from wegovy or other source).  Dispense: 20 tablet; Refill: 0  Essential hypertension, benign -     amLODIPine Besylate; Take 1 tablet (10 mg total) by mouth daily.  Dispense: 90 tablet; Refill: 3 -     hydrALAZINE HCl; Take 1 tablet (25 mg total) by mouth 3 (three) times daily.  Dispense: 270 tablet; Refill: 0 -     Metoprolol Succinate ER; Take 1  tablet (25 mg total) by mouth daily.  Dispense: 90 tablet; Refill: 3  Intrinsic (allergic) eczema -     Triamcinolone Acetonide; Apply 1 Application topically daily as needed.  Dispense: 453 g; Refill: 11  Intrinsic atopic dermatitis -     Triamcinolone Acetonide; Apply 1 Application topically daily as needed.  Dispense: 453 g; Refill: 11  Insomnia, unspecified type -     Vitamin B-12; Take 1 tablet (1,000 mcg total) by mouth daily.  Dispense: 90 tablet; Refill: 3  Uncomplicated asthma, unspecified asthma severity, unspecified whether persistent -     Albuterol Sulfate HFA; Inhale 2 puffs into the lungs every 4 (four) hours as needed for wheezing or shortness of breath.  Dispense: 18 g; Refill: 2  B12 deficiency -     Vitamin B-12; Take 1 tablet (1,000 mcg total) by mouth daily.  Dispense: 90 tablet; Refill: 3  Arthritis -     Meloxicam; TAKE 1 TABLET BY MOUTH TWICE A DAY FOR 2 WEEKS AND THEN AS NEEDED  Dispense: 90 tablet; Refill: 3  Osteoarthritis of left knee, unspecified osteoarthritis type Overview: Walks without bending left knee     Brief overview of her case Comprehensive Review of the Case: The patient is a 46 year old woman who presents with complaints of anxiety and difficulty losing weight, despite having a BMI of 56. She has a family history of colon cancer in her mother at the age of 46 and a personal history of vitamin D deficiency. She is currently on medication for blood pressure, but the specific medication is not mentioned. There is no information provided about her social history, the onset and tempo of her illness, associated signs and symptoms, laboratory data, course of the illness, or any imaging data and other studies.   Most Likely Dx:  The most likely diagnosis for this patient is Metabolic Syndrome. This syndrome is characterized by a group of risk factors that increase the chance of developing heart disease, diabetes, and stroke. The patient's high BMI,  difficulty losing weight, and need for blood pressure medication suggest the presence of some of these risk factors. The presence of other risk factors such as high blood sugar, high triglyceride levels, low HDL cholesterol, or a large waist circumference could further suggest this diagnosis.  Expanded DDx:  1. Polycystic Ovary Syndrome (PCOS): PCOS is a hormonal disorder common among women of reproductive age and can cause weight gain and difficulty losing weight. It can also lead to anxiety due to the hormonal imbalance. The presence of irregular periods, excess androgen, or polycystic ovaries could further suggest this diagnosis.  2. Hypothyroidism: Hypothyroidism is a condition where the thyroid gland does not produce enough thyroid hormones. This can lead to weight gain and difficulty losing weight. It can also cause anxiety. The presence of other symptoms such as fatigue, increased sensitivity to cold, constipation, dry skin, puffy face, hoarseness, muscle weakness,  elevated blood cholesterol level, muscle aches, tenderness and stiffness, pain, stiffness or swelling in your joints, heavier than normal or irregular menstrual periods, thinning hair, slowed heart rate, depression, impaired memory, or an enlarged thyroid gland (goiter) could further suggest this diagnosis.  Alternative DDx:  1. Cushing's Syndrome: Cushing's syndrome is a condition caused by prolonged exposure to high levels of cortisol, which can lead to weight gain and difficulty losing weight. It can also cause anxiety. The presence of a fatty hump between the shoulders, a rounded face, pink or purple stretch marks, skin that bruises easily, and excess body and facial hair could further suggest this diagnosis.  2. Generalized Anxiety Disorder (GAD): GAD is a common anxiety disorder characterized by chronic anxiety, exaggerated worry and tension. This could explain the patient's anxiety. The presence of restlessness, being easily  fatigued, difficulty concentrating, irritability, muscle tension, and sleep disturbances could further suggest this diagnosis.  3. Major Depressive Disorder (MDD): MDD is a mental health disorder characterized by persistently depressed mood or loss of interest in activities. This could explain the patient's anxiety. The presence of feelings of sadness, tearfulness, emptiness or hopelessness, angry outbursts, irritability or frustration, loss of interest or pleasure in most or all normal activities, sleep disturbances, tiredness and lack of energy, reduced appetite and weight loss or increased cravings for food and weight gain, anxiety, agitation or restlessness, slowed thinking, speaking or body movements, feelings of worthlessness or guilt, trouble thinking, concentrating, making decisions and remembering things, frequent or recurrent thoughts of death, suicidal thoughts, suicide attempts or suicide, unexplained physical problems, such as back pain or headaches could further suggest this diagnosis.  4. Sleep Apnea: Sleep apnea is a serious sleep disorder that occurs when a person's breathing is interrupted during sleep. This could explain the patient's difficulty losing weight and anxiety. The presence of loud snoring, episodes in which you stop breathing during sleep, gasping for air during sleep, awakening with a dry mouth, morning headache, difficulty staying asleep, excessive daytime sleepiness, difficulty paying attention while awake, and irritability could further suggest this diagnosis.   5. Vitamin D Deficiency: Vitamin D deficiency can cause a variety of non-specific symptoms, including possibly contributing to weight gain and anxiety. The presence of bone pain and muscle weakness could further suggest this diagnosis.  # Obesity and Anxiety  The patient is a 46 year old woman with a high BMI of 56, indicating severe obesity, who is experiencing anxiety and difficulty losing weight. She has a  family history of colon cancer and a personal history of vitamin D deficiency. She is currently on medication for hypertension, which is a common comorbidity in obesity. The patient's high BMI and difficulty losing weight, despite efforts, suggest the presence of metabolic syndrome, a condition characterized by a group of risk factors that increase the chance of developing heart disease, diabetes, and stroke. The patient's anxiety could be related to her obesity or could be a symptom of another underlying condition. The differential diagnosis includes metabolic syndrome, polycystic ovary syndrome (PCOS), hypothyroidism, Cushing's syndrome, generalized anxiety disorder (GAD), major depressive disorder (MDD), sleep apnea, and vitamin D deficiency.  Diagnosis labs ordered and tests performed today - Fasting lipid panel - Fasting glucose or HbA1c measurement - Urine glucose - Liver chemistries - Thyroid function tests - Cortisol levels - GAD-7 (General Anxiety Disorder-7)    01/27/2022    2:06 PM 06/24/2019   11:07 AM 05/21/2019    9:33 AM 04/23/2019    9:21 AM  GAD 7 : Generalized  Anxiety Score  Nervous, Anxious, on Edge 3 0 0 0  Control/stop worrying 0 0 0 0  Worry too much - different things 0 0 0 0  Trouble relaxing 2 0  0  Restless 0 0 0 0  Easily annoyed or irritable 3 0 0 0  Afraid - awful might happen 0 0 0 0  Total GAD 7 Score 8 0  0  Anxiety Difficulty  Not difficult at all Not difficult at all Not difficult at all     - PHQ-9 (Patient Health Questionnaire-9)    01/27/2022    2:10 PM 01/27/2022    1:30 PM 04/27/2021    1:18 PM  PHQ9 SCORE ONLY  PHQ-9 Total Score 6 0 1    - STOP-BANG Score for Obstructive Sleep Apnea STOP-BANG Score for Obstructive Sleep Apnea from [email or web address]  on 01/27/2022 ** All calculations should be rechecked by clinician prior to use **  RESULT SUMMARY: 4 points STOP-BANG  Intermediate  Risk for moderate to severe OSA   INPUTS: Do you snore loudly?  --> 1 = Yes Do you often feel tired, fatigued, or sleepy during the daytime? --> 1 = Yes Has anyone observed you stop breathing during sleep? --> 0 = No Do you have (or are you being treated for) high blood pressure? --> 0 = No BMI --> 1 = >35 kg/m Age --> 0 = ?50 years Neck circumference --> 1 = >40 cm Gender --> 0 = Female  Tx - Lifestyle changes: dietary modifications, increased physical activity, improved sleep hygiene, and stress management techniques for anxiety - Pharmacologic therapies: Consider GLP-1 agonists such as liraglutide or semaglutide for weight loss. Anxiolytic medications may be considered based on the severity of the patient's anxiety symptoms.  She declines further change at this time such as psych or medication changes but plans to continue with trazodone and her behavioral health therapist. - Surgical therapies: Bariatric surgery may be considered if lifestyle modifications and pharmacologic therapies are not effective in achieving weight loss goals. - Vitamin D supplementation: Given the patient's history of vitamin D deficiency, continued supplementation may be necessary recommended she stay on her current dose of 2000 daily bid - Regular follow-up: To monitor the patient's progress and adjust the treatment plan as necessary.  Today's visit is a problem focused visit, but preventive care maintenance concerns were considered by the physician in deciding follow up : Health Maintenance  Topic Date Due   Hepatitis C Screening  Never done   COLONOSCOPY (Pts 45-47yrs Insurance coverage will need to be confirmed)  Never done   PAP SMEAR-Modifier  04/22/2022   DTaP/Tdap/Td (2 - Td or Tdap) 05/29/2023   HIV Screening  Addressed   HPV VACCINES  Aged Out   INFLUENZA VACCINE  Discontinued   COVID-19 Vaccine  Discontinued  The Dr. She had before Dr Kateri Plummer.   Subjective:  Patient presents today to establish care.  Chief Complaint  Patient presents with   Transfer of  care    Will do blood work at a later time. No concerns   When pressed for a concern to address she picks anxiety.  We went ahead and entered some of her phi free data into [facility] and generated problems / plans as above.  Problem-oriented charting was used to update the medical history: Problem  Metabolic Syndrome X  Morbid Obesity (Hcc)  Snoring  Osteoarthritis of Left Knee   Walks without bending left knee   Class 3 Severe Obesity  With Body Mass Index (Bmi) of 50.0 to 59.9 in Adult (Hcc)  Intrinsic (Allergic) Eczema     Depression Screen    01/27/2022    2:10 PM 01/27/2022    1:30 PM 04/27/2021    1:18 PM 04/20/2021   12:16 PM  PHQ 2/9 Scores  PHQ - 2 Score 1 0 1 0  PHQ- 9 Score 6   0   No results found for any visits on 01/27/22.   The following were reviewed and entered/updated in epic: Past Medical History:  Diagnosis Date   Allergy    Anxiety    Arthritis    Asthma    since COVID   Eczema    Hypertension    Obesity    Sciatica    spasms daily in left leg    Past Surgical History:  Procedure Laterality Date   NO PAST SURGERIES     WISDOM TOOTH EXTRACTION     Family History  Problem Relation Age of Onset   Colon cancer Mother        dx at age   Hypertension Mother    Cancer Mother        colon   Healthy Father    Healthy Brother    Outpatient Medications Prior to Visit  Medication Sig Dispense Refill   acetaminophen (TYLENOL) 500 MG tablet Take 1,000 mg by mouth every 6 (six) hours as needed for moderate pain.     BLACK CURRANT SEED OIL PO Take 1,300 mg by mouth 2 (two) times daily.     Dupilumab (DUPIXENT) 300 MG/2ML SOPN Inject 300 mg into the skin every 14 (fourteen) days. Starting at day 15 for maintenance. 2 mL 0   OVER THE COUNTER MEDICATION Take 450 mg by mouth daily. Burdock Root     vitamin E 180 MG (400 UNITS) capsule Take 400 Units by mouth daily.     albuterol (VENTOLIN HFA) 108 (90 Base) MCG/ACT inhaler Inhale 2 puffs into the lungs  every 4 (four) hours as needed for wheezing or shortness of breath. 18 g 2   amLODipine (NORVASC) 10 MG tablet Take 1 tablet (10 mg total) by mouth daily. 90 tablet 3   benzonatate (TESSALON) 100 MG capsule Take 1 capsule (100 mg total) by mouth every 8 (eight) hours. 21 capsule 0   hydrALAZINE (APRESOLINE) 25 MG tablet Take 1 tablet (25 mg total) by mouth 3 (three) times daily. 270 tablet 0   meloxicam (MOBIC) 7.5 MG tablet TAKE 1 TABLET BY MOUTH TWICE A DAY FOR 2 WEEKS AND THEN AS NEEDED     QUEtiapine (SEROQUEL) 25 MG tablet TAKE 1 TABLET BY MOUTH EVERYDAY AT BEDTIME 90 tablet 1   traZODone (DESYREL) 50 MG tablet TAKE 0.5-1 TABLETS (25-50 MG TOTAL) BY MOUTH AT BEDTIME AS NEEDED. FOR SLEEP 90 tablet 2   triamcinolone cream (KENALOG) 0.1 % Apply 1 application topically daily as needed. 453 g 11   vitamin B-12 (CYANOCOBALAMIN) 1000 MCG tablet Take 1,000 mcg by mouth daily.     azelastine (ASTELIN) 0.1 % nasal spray Place 1 spray into both nostrils 2 (two) times daily. Use in each nostril as directed (Patient taking differently: Place 1 spray into both nostrils 2 (two) times daily as needed for allergies or rhinitis. Use in each nostril as directed) 30 mL 12   buPROPion (WELLBUTRIN XL) 300 MG 24 hr tablet Take 1 tablet (300 mg total) by mouth daily. (Patient taking differently: Take 300 mg by mouth at  bedtime as needed (Calm dowm).) 90 tablet 1   Clobetasol Prop Emollient Base 0.05 % emollient cream Apply to affected area bid 60 g 2   HYDROcodone-acetaminophen (NORCO/VICODIN) 5-325 MG tablet Take 1-2 tablets by mouth every 6 (six) hours as needed for moderate pain. (Patient not taking: Reported on 01/27/2022) 6 tablet 0   metoprolol succinate (TOPROL XL) 25 MG 24 hr tablet Take 1 tablet (25 mg total) by mouth daily. 90 tablet 3   metoprolol succinate (TOPROL-XL) 25 MG 24 hr tablet Take 1 tablet by mouth daily.     No facility-administered medications prior to visit.    Allergies  Allergen Reactions    Lisinopril Cough   Hydrochlorothiazide Nausea And Vomiting   Losartan Nausea And Vomiting   Penicillins Rash and Other (See Comments)   Social History   Tobacco Use   Smoking status: Never   Smokeless tobacco: Never  Vaping Use   Vaping Use: Never used  Substance Use Topics   Alcohol use: Not Currently    Comment: socially   Drug use: Yes    Types: Marijuana    Comment: occasional    Immunization History  Administered Date(s) Administered   Moderna Sars-Covid-2 Vaccination 06/15/2019, 07/13/2019   Tdap 05/28/2013    Objective:  BP 138/78 (BP Location: Left Wrist, Patient Position: Sitting)   Pulse 68   Temp 98.2 F (36.8 C) (Temporal)   Resp 12   Ht  (1.676 m)   Wt (!) 351 lb 6.4 oz (159.4 kg)   SpO2 97%   BMI 56.72 kg/m  Body mass index is 56.72 kg/m.   Physical Exam Vitals and nursing note reviewed.  Constitutional:      General: She is not in acute distress.    Appearance: Normal appearance. She is obese. She is not ill-appearing, toxic-appearing or diaphoretic.  HENT:     Head: Normocephalic and atraumatic.     Nose: Nose normal.     Mouth/Throat:     Mouth: Mucous membranes are moist.  Eyes:     General: No scleral icterus.    Conjunctiva/sclera: Conjunctivae normal.  Pulmonary:     Effort: Pulmonary effort is normal. No respiratory distress.  Neurological:     Mental Status: She is alert.  Psychiatric:        Mood and Affect: Mood normal.        Behavior: Behavior normal.    Can't really flex left knee so walks with limp      Time Spent: 55 minutes of total time was spent on the date of the encounter performing the following actions: chart review while seeing the patient, obtaining history, updating the chart extensively, discussing importance of weight loss and developing a plan for that, screening exams for anxiety and depression with follow up discussion of management options, determining the need for sleep study, reconciling and  refilling all of her chronic medications, performing a medically necessary exam, counseling on the treatment plan, placing extensive orders, and documenting in our EHR.

## 2022-01-27 NOTE — Assessment & Plan Note (Signed)
We had a conversation today about healthy eating habits, exercise, calorie and carb goals for sustainable and successful weight loss. I gave her caloric and protein daily intake values and encouraged increasing fiber and water intake. I discussed weight loss medications that could be used with consideration of her specific past medical history and financial limitations.  I explained to her  the 3 things that almost all people who lose weight and keep it off do: 1.  Cut out all sugary beverages and all calorie-containing beverages  especially try gold peak diet tea and stop sweet tea. 2.  Resistance training (cardiac exercise not as important for maintaining muscle mass) 3.  Actually counting calories and carbs and deciding against foods that have high amounts - I.e. Check nutrition labels.

## 2022-02-08 ENCOUNTER — Other Ambulatory Visit: Payer: 59

## 2022-02-10 DIAGNOSIS — Z1211 Encounter for screening for malignant neoplasm of colon: Secondary | ICD-10-CM | POA: Diagnosis not present

## 2022-02-18 LAB — COLOGUARD: COLOGUARD: NEGATIVE

## 2022-02-21 NOTE — Progress Notes (Signed)
Notify patient if not seen in myChart: Cologuard testing was Negative, this is great news.  My usual recommendation for future testing is: Cologuard testing once every 3 years from age 46 to 28 years unless you have a particularly concerning family history of colon cancer, but we can do it more frequently if you like.  Rebecca Olszewski, MD  02/21/2022 6:24 PM

## 2022-04-12 ENCOUNTER — Ambulatory Visit: Payer: 59 | Admitting: Physician Assistant

## 2022-04-17 ENCOUNTER — Other Ambulatory Visit: Payer: Self-pay

## 2022-04-17 ENCOUNTER — Emergency Department (HOSPITAL_BASED_OUTPATIENT_CLINIC_OR_DEPARTMENT_OTHER): Payer: 59 | Admitting: Radiology

## 2022-04-17 ENCOUNTER — Encounter: Payer: Self-pay | Admitting: Physician Assistant

## 2022-04-17 ENCOUNTER — Emergency Department (HOSPITAL_BASED_OUTPATIENT_CLINIC_OR_DEPARTMENT_OTHER)
Admission: EM | Admit: 2022-04-17 | Discharge: 2022-04-17 | Disposition: A | Discharge status: Home / Self Care | Payer: 59 | Attending: Emergency Medicine | Admitting: Emergency Medicine

## 2022-04-17 ENCOUNTER — Encounter (HOSPITAL_BASED_OUTPATIENT_CLINIC_OR_DEPARTMENT_OTHER): Payer: Self-pay | Admitting: Emergency Medicine

## 2022-04-17 ENCOUNTER — Ambulatory Visit (INDEPENDENT_AMBULATORY_CARE_PROVIDER_SITE_OTHER): Payer: 59 | Admitting: Physician Assistant

## 2022-04-17 VITALS — BP 144/100 | HR 87 | Ht 66.0 in | Wt 351.0 lb

## 2022-04-17 DIAGNOSIS — R42 Dizziness and giddiness: Secondary | ICD-10-CM | POA: Insufficient documentation

## 2022-04-17 DIAGNOSIS — J069 Acute upper respiratory infection, unspecified: Secondary | ICD-10-CM | POA: Insufficient documentation

## 2022-04-17 DIAGNOSIS — R001 Bradycardia, unspecified: Secondary | ICD-10-CM | POA: Diagnosis not present

## 2022-04-17 DIAGNOSIS — D72819 Decreased white blood cell count, unspecified: Secondary | ICD-10-CM | POA: Insufficient documentation

## 2022-04-17 DIAGNOSIS — I10 Essential (primary) hypertension: Secondary | ICD-10-CM | POA: Diagnosis not present

## 2022-04-17 DIAGNOSIS — U071 COVID-19: Secondary | ICD-10-CM | POA: Diagnosis not present

## 2022-04-17 DIAGNOSIS — Z79899 Other long term (current) drug therapy: Secondary | ICD-10-CM | POA: Insufficient documentation

## 2022-04-17 DIAGNOSIS — R0602 Shortness of breath: Secondary | ICD-10-CM | POA: Diagnosis not present

## 2022-04-17 LAB — CBC WITH DIFFERENTIAL/PLATELET
Abs Immature Granulocytes: 0 10*3/uL (ref 0.00–0.07)
Basophils Absolute: 0 10*3/uL (ref 0.0–0.1)
Basophils Relative: 1 %
Eosinophils Absolute: 0.2 10*3/uL (ref 0.0–0.5)
Eosinophils Relative: 6 %
HCT: 39.9 % (ref 36.0–46.0)
Hemoglobin: 13 g/dL (ref 12.0–15.0)
Immature Granulocytes: 0 %
Lymphocytes Relative: 45 %
Lymphs Abs: 1.5 10*3/uL (ref 0.7–4.0)
MCH: 27.8 pg (ref 26.0–34.0)
MCHC: 32.6 g/dL (ref 30.0–36.0)
MCV: 85.4 fL (ref 80.0–100.0)
Monocytes Absolute: 0.3 10*3/uL (ref 0.1–1.0)
Monocytes Relative: 9 %
Neutro Abs: 1.3 10*3/uL — ABNORMAL LOW (ref 1.7–7.7)
Neutrophils Relative %: 39 %
Platelets: 254 10*3/uL (ref 150–400)
RBC: 4.67 MIL/uL (ref 3.87–5.11)
RDW: 13.8 % (ref 11.5–15.5)
WBC: 3.3 10*3/uL — ABNORMAL LOW (ref 4.0–10.5)
nRBC: 0 % (ref 0.0–0.2)

## 2022-04-17 LAB — COMPREHENSIVE METABOLIC PANEL
ALT: 25 U/L (ref 0–44)
AST: 17 U/L (ref 15–41)
Albumin: 3.8 g/dL (ref 3.5–5.0)
Alkaline Phosphatase: 107 U/L (ref 38–126)
Anion gap: 6 (ref 5–15)
BUN: 5 mg/dL — ABNORMAL LOW (ref 6–20)
CO2: 28 mmol/L (ref 22–32)
Calcium: 9.6 mg/dL (ref 8.9–10.3)
Chloride: 105 mmol/L (ref 98–111)
Creatinine, Ser: 0.85 mg/dL (ref 0.44–1.00)
GFR, Estimated: >60 mL/min (ref >60)
Glucose, Bld: 110 mg/dL — ABNORMAL HIGH (ref 70–99)
Potassium: 4 mmol/L (ref 3.5–5.1)
Sodium: 139 mmol/L (ref 135–145)
Total Bilirubin: 0.4 mg/dL (ref 0.3–1.2)
Total Protein: 7.1 g/dL (ref 6.5–8.1)

## 2022-04-17 LAB — POC COVID19 BINAXNOW: SARS Coronavirus 2 Ag: POSITIVE — AB

## 2022-04-17 LAB — TROPONIN I (HIGH SENSITIVITY)
Troponin I (High Sensitivity): 5 ng/L (ref <18)
Troponin I (High Sensitivity): 6 ng/L (ref <18)

## 2022-04-17 MED ORDER — KETOROLAC TROMETHAMINE 30 MG/ML IJ SOLN
30.0000 mg | Freq: Once | INTRAMUSCULAR | Status: AC
  Administered 2022-04-17: 30 mg via INTRAVENOUS
  Filled 2022-04-17: qty 1

## 2022-04-17 MED ORDER — LACTATED RINGERS IV BOLUS
1000.0000 mL | Freq: Once | INTRAVENOUS | Status: AC
  Administered 2022-04-17: 1000 mL via INTRAVENOUS

## 2022-04-17 MED ORDER — AMLODIPINE BESYLATE 5 MG PO TABS
10.0000 mg | ORAL_TABLET | Freq: Once | ORAL | Status: AC
  Administered 2022-04-17: 10 mg via ORAL
  Filled 2022-04-17: qty 2

## 2022-04-17 MED ORDER — HYDRALAZINE HCL 25 MG PO TABS
25.0000 mg | ORAL_TABLET | Freq: Once | ORAL | Status: AC
  Administered 2022-04-17: 25 mg via ORAL
  Filled 2022-04-17: qty 1

## 2022-04-17 MED ORDER — ONDANSETRON HCL 4 MG/2ML IJ SOLN
4.0000 mg | Freq: Once | INTRAMUSCULAR | Status: AC
  Administered 2022-04-17: 4 mg via INTRAVENOUS
  Filled 2022-04-17: qty 2

## 2022-04-17 NOTE — ED Triage Notes (Signed)
Diagnosed with COVID last Wednesday. Reports PCP told her to come here because she isn't getting any better. C/o SOB and cough.

## 2022-04-17 NOTE — ED Provider Notes (Signed)
[facility] Provider Note   CSN: [id number] Arrival date & time: 04/17/22  0930     History  Chief Complaint  Patient presents with   Shortness of Breath    Rebecca Collier is a 47 y.o. female.  With PMH of HTN, obesity, anxiety, COVID-positive 04/11/2022 who presents with ongoing symptoms of cough, shortness of breath, headache, nausea, vomiting, diarrhea and fatigue.  Patient tested positive for COVID on a home test on 04/11/2022.  She has been having ongoing symptoms of mild headache associated with dry cough, generalized fatigue, intermittent nausea with vomiting after foods.  However she been keeping liquids down.  She has had nonbloody diarrhea.  No abdominal pain.  She has not had only substernal nonradiating chest pain with coughing fits.  She has had some shortness of breath with ambulating.  She is not on any oral contraceptives or estrogen therapy.  She has had no history of PE or DVT.  She has been using medicines such as TheraFlu, Tylenol, Zofran and Korostatin without relief.  She had a video visit with her PCP today who recommended she go to the ER for fluid therapy.  She notes her biggest concern is she feels lightheaded whenever she changes positions however she has not passed out.   Shortness of Breath      Home Medications Prior to Admission medications   Medication Sig Start Date End Date Taking? Authorizing Provider  acetaminophen (TYLENOL) 500 MG tablet Take 1,000 mg by mouth every 6 (six) hours as needed for moderate pain.    [provider]  albuterol (VENTOLIN HFA) 108 (90 Base) MCG/ACT inhaler Inhale 2 puffs into the lungs every 4 (four) hours as needed for wheezing or shortness of breath. 01/27/22   Loralee Pacas, MD  amLODipine (NORVASC) 10 MG tablet Take 1 tablet (10 mg total) by mouth daily. 01/27/22   Loralee Pacas, MD  BLACK CURRANT SEED OIL PO Take 1,300 mg by mouth 2 (two) times daily.    [provider]  cyanocobalamin (VITAMIN B12) 1000 MCG tablet Take 1 tablet (1,000 mcg total) by mouth daily. 01/27/22   Loralee Pacas, MD  Dupilumab (DUPIXENT) 300 MG/2ML SOPN Inject 300 mg into the skin every 14 (fourteen) days. Starting at day 15 for maintenance. 04/12/21   [place], Ronalee Red, PA-C  hydrALAZINE (APRESOLINE) 25 MG tablet Take 1 tablet (25 mg total) by mouth 3 (three) times daily. 01/27/22   Loralee Pacas, MD  meloxicam (MOBIC) 7.5 MG tablet TAKE 1 TABLET BY MOUTH TWICE A DAY FOR 2 WEEKS AND THEN AS NEEDED 01/27/22   Loralee Pacas, MD  metoprolol succinate (TOPROL-XL) 25 MG 24 hr tablet Take 1 tablet (25 mg total) by mouth daily. 01/27/22   Loralee Pacas, MD  ondansetron (ZOFRAN-ODT) 4 MG disintegrating tablet Take 1 tablet (4 mg total) by mouth every 8 (eight) hours as needed for nausea or vomiting (for nausea from wegovy or other source). 01/27/22   Loralee Pacas, MD  OVER THE COUNTER MEDICATION Take 450 mg by mouth daily. Burdock Root    [provider]  QUEtiapine (SEROQUEL) 25 MG tablet TAKE 1 TABLET BY MOUTH EVERYDAY AT BEDTIME Patient taking differently: as needed. TAKE 1 TABLET BY MOUTH EVERYDAY AT BEDTIME 01/27/22   Loralee Pacas, MD  SUMAtriptan (IMITREX) 50 MG tablet Take 1 tablet (50 mg total) by mouth daily as needed for migraine. May repeat in 2 hours if  headache persists or recurs. Patient not taking: Reported on 04/17/2022 01/27/22   Loralee Pacas, MD  traZODone (DESYREL) 50 MG tablet TAKE 0.5-1 TABLETS (25-50 MG TOTAL) BY MOUTH AT BEDTIME AS NEEDED. FOR SLEEP 01/27/22   Loralee Pacas, MD  triamcinolone cream (KENALOG) 0.1 % Apply 1 Application topically daily as needed. 01/27/22   Loralee Pacas, MD  vitamin E 180 MG (400 UNITS) capsule Take 400 Units by mouth daily.    [provider]      Allergies    Lisinopril, Hydrochlorothiazide, Losartan, and Penicillins    Review of Systems   Review of Systems  Respiratory:  Positive for  shortness of breath.     Physical Exam Updated Vital Signs BP (!) 170/90   Pulse (!) 54   Temp 98.2 F (36.8 C) (Oral)   Resp (!) 24   LMP 03/27/2022 (Approximate)   SpO2 99%  Physical Exam Constitutional: Alert and oriented. Well appearing and in no distress. Eyes: Conjunctivae are normal. ENT      Head: Normocephalic and atraumatic.      Nose: No congestion.      Mouth/Throat: Mucous membranes are moist.      Neck: No stridor. Cardiovascular: S1, S2, bradycardic, regular rhythm, normal and symmetric distal pulses are present in all extremities.Warm and well perfused. Respiratory: Normal respiratory effort. Breath sounds are normal.  O2 sat 98 on RA Gastrointestinal: Soft and nontender. Musculoskeletal: Normal range of motion in all extremities.      Right lower leg: No tenderness or edema.      Left lower leg: No tenderness or edema. Neurologic: Normal speech and language.  No facial droop.  Moving all extremities equally.  Sensation grossly intact.  No gross focal neurologic deficits are appreciated. Skin: Skin is warm, dry and intact. No rash noted. Psychiatric: Mood and affect are normal. Speech and behavior are normal.  ED Results / Procedures / Treatments   Labs (all labs ordered are listed, but only abnormal results are displayed) Labs Reviewed  COMPREHENSIVE METABOLIC PANEL - Abnormal; Notable for the following components:      Result Value   Glucose, Bld 110 (*)    BUN 5 (*)    All other components within normal limits  CBC WITH DIFFERENTIAL/PLATELET - Abnormal; Notable for the following components:   WBC 3.3 (*)    Neutro Abs 1.3 (*)    All other components within normal limits  TROPONIN I (HIGH SENSITIVITY)  TROPONIN I (HIGH SENSITIVITY)    EKG EKG Interpretation  Date/Time:  Monday April 17 2022 09:50:41 EST Ventricular Rate:  56 PR Interval:  170 QRS Duration: 93 QT Interval:  441 QTC Calculation: 426 R Axis:   14 Text Interpretation: Sinus  rhythm Confirmed by Georgina Snell [phone]) on 04/17/2022 9:54:48 AM  Radiology DG Chest 2 View  Result Date: 04/17/2022 CLINICAL DATA:  Shortness of breath EXAM: CHEST - 2 VIEW COMPARISON:  03/21/2021 FINDINGS: The heart size and mediastinal contours are within normal limits. Both lungs are clear. The visualized skeletal structures are unremarkable. IMPRESSION: No active cardiopulmonary disease. Electronically Signed   By: Davina Poke D.O.   On: 04/17/2022 10:19    Procedures Procedures    Medications Ordered in ED Medications  ketorolac (TORADOL) 30 MG/ML injection 30 mg (30 mg Intravenous Given 04/17/22 1124)  lactated ringers bolus 1,000 mL (0 mLs Intravenous Stopped 04/17/22 1152)  ondansetron (ZOFRAN) injection 4 mg (4 mg Intravenous Given 04/17/22 1124)  amLODipine (NORVASC)  tablet 10 mg (10 mg Oral Given 04/17/22 1126)  hydrALAZINE (APRESOLINE) tablet 25 mg (25 mg Oral Given 04/17/22 1140)    ED Course/ Medical Decision Making/ A&P Clinical Course as of 04/17/22 1836  Mon Apr 17, 2022  1333 Labs reviewed generally unremarkable mild leukopenia white blood cell count 3.3 likely due to known COVID infection.  Creatinine 0.85 within normal limits.  Troponins generally flat and reassuring 5 and repeat 6.  No signs of ischemia on EKG.  No arrhythmia present on EKG.  Chest x-ray reviewed by me no pneumonia, no pulmonary edema, no pleural effusions.  Patient has ambulated without any further symptoms.  She feels generally well and improved from prior.  I did discuss holding her metoprolol at this time as she does have bradycardia persistent in the 50s.  I also advise she increase her hydralazine to 50 mg and referred to cardiology.  Did discuss strict return precautions including any fainting episodes or worsening chest pain or shortness of breath.  She has no signs of hypertensive emergency today based on reassuring workup however did discuss the importance of close follow-up and increased  risk of stroke, heart attack and other issues with untreated high blood pressure.  She is understanding and in agreement with discharge and safe for discharge. [VB]    Clinical Course User Index [VB] Elgie Congo, MD   {                             Medical Decision Making  Tyease Shillingford is a 47 y.o. female.  With PMH of HTN, obesity, anxiety, COVID-positive 04/11/2022 who presents with ongoing symptoms of cough, shortness of breath, headache, nausea, vomiting, diarrhea and fatigue.    Patient presents with symptoms all consistent with known diagnosis of COVID.  She is overall well-appearing in no acute distress no clinical signs of severe dehydration.  Will check labs to ensure no evidence of acute electrolyte abnormalities or AKI or anemia.  Will obtain chest x-ray to further evaluate for pneumonia.  No concern for PE as patient is PERC negative.  No concern for meningitis with significantly nontoxic well appearance, no fever, no neurologic deficits or meningismus.  EKG reviewed by me sinus rhythm, no ST/T changes indicating acute ischemia, normal QTc, no ARVD, no WPW.  Labs reviewed by me generally unremarkable mild leukopenia white blood cell count 3.3 likely due to known COVID infection.  Creatinine 0.85 within normal limits.  Troponins generally flat and reassuring 5 and repeat 6.  No signs of ischemia on EKG.  No arrhythmia present on EKG.  Chest x-ray reviewed by me no pneumonia, no pulmonary edema, no pleural effusions.  Patient has ambulated without any further symptoms.  She feels generally well and improved from prior.  I did discuss holding her metoprolol at this time as she does have bradycardia persistent in the 50s.  I also advise she increase her hydralazine to 50 mg and referred to cardiology.  Did discuss strict return precautions including any fainting episodes or worsening chest pain or shortness of breath.  She has no signs of hypertensive emergency today based on  reassuring workup however did discuss the importance of close follow-up and increased risk of stroke, heart attack and other issues with untreated high blood pressure.  She is understanding and in agreement with discharge and safe for discharge. [VB]  Amount and/or Complexity of Data Reviewed Labs: ordered. Radiology: ordered.  Risk Prescription  drug management.    Final Clinical Impression(s) / ED Diagnoses Final diagnoses:  COVID-19  Viral URI  Lightheadedness  Bradycardia    Rx / DC Orders ED Discharge Orders          Ordered    Ambulatory referral to Cardiology       Comments: If you have not heard from the Cardiology office within the next 72 hours please call [phone].   04/17/22 [place], [place], MD 04/17/22 1836

## 2022-04-17 NOTE — ED Notes (Signed)
Patient verbalizes understanding of discharge instructions. Opportunity for questioning and answers were provided. Patient discharged from ED.  °

## 2022-04-17 NOTE — Progress Notes (Signed)
Virtual Visit via Video   I connected with Rebecca Collier on 04/17/22 at  8:40 AM EST by a video enabled telemedicine application and verified that I am speaking with the correct person using two identifiers. Location patient: Home Location provider: [facility], Office Persons participating in the virtual visit: Ysabella, Demaine PA-C, Anselmo Pickler, LPN   I discussed the limitations of evaluation and management by telemedicine and the availability of in person appointments. The patient expressed understanding and agreed to proceed.  I acted as a Education administrator for [employer], [employer], LPN   Subjective:   HPI:   COVID-19 Positive Symptom onset: 04/11/2022 Travel/contacts: exposed at work  Vaccination status: 2 shots Testing results: Home test positive 04/11/2022  Patient endorses the following symptoms: cough, expectorating clear sputum, headache, fatigue, no appetite, diarrhea, nausea and vomiting when she tries to eat. Has not eaten since Monday, she is able to hold liquids down  Patient denies the following symptoms: Having chest pain with coughing and some SOB. She also feels like her RLE is swollen compared to her LLE.   She is most concerned because she is feeling dizzy and lightheaded and like she needs to pass out whenever she is walking.  Had COVID last year in January -- similar symptoms but resolved much more quickly.  Treatments tried: Coricidin, Tylenol and Thera-Flu, Pedialyte, Zofran  ROS: See pertinent positives and negatives per HPI.  Patient Active Problem List   Diagnosis Date Noted   Metabolic syndrome X [id number]   Morbid obesity ([place]) 01/27/2022   Snoring 01/27/2022   Osteoarthritis of left knee 01/27/2022   Elevated blood sugar 05/21/2019   Vitamin D deficiency 05/06/2019   Well female exam with routine gynecological exam 04/23/2019   Class 3 severe obesity with body mass index (BMI) of 50.0 to 59.9 in adult [facility]) 04/23/2019    Psychophysiological insomnia 04/23/2019   GAD (generalized anxiety disorder) 04/23/2019   Chronic migraine without aura without status migrainosus, not intractable 04/23/2019   Intrinsic (allergic) eczema 02/06/2013   Essential hypertension, benign 02/06/2013   Family history of colon cancer in mother (age 75) 02/06/2013    Social History   Tobacco Use   Smoking status: Never   Smokeless tobacco: Never  Substance Use Topics   Alcohol use: Not Currently    Comment: socially    Current Outpatient Medications:    acetaminophen (TYLENOL) 500 MG tablet, Take 1,000 mg by mouth every 6 (six) hours as needed for moderate pain., Disp: , Rfl:    albuterol (VENTOLIN HFA) 108 (90 Base) MCG/ACT inhaler, Inhale 2 puffs into the lungs every 4 (four) hours as needed for wheezing or shortness of breath., Disp: 18 g, Rfl: 2   amLODipine (NORVASC) 10 MG tablet, Take 1 tablet (10 mg total) by mouth daily., Disp: 90 tablet, Rfl: 3   BLACK CURRANT SEED OIL PO, Take 1,300 mg by mouth 2 (two) times daily., Disp: , Rfl:    cyanocobalamin (VITAMIN B12) 1000 MCG tablet, Take 1 tablet (1,000 mcg total) by mouth daily., Disp: 90 tablet, Rfl: 3   Dupilumab (DUPIXENT) 300 MG/2ML SOPN, Inject 300 mg into the skin every 14 (fourteen) days. Starting at day 15 for maintenance., Disp: 2 mL, Rfl: 0   hydrALAZINE (APRESOLINE) 25 MG tablet, Take 1 tablet (25 mg total) by mouth 3 (three) times daily., Disp: 270 tablet, Rfl: 0   meloxicam (MOBIC) 7.5 MG tablet, TAKE 1 TABLET BY MOUTH TWICE A DAY FOR 2  WEEKS AND THEN AS NEEDED, Disp: 90 tablet, Rfl: 3   metoprolol succinate (TOPROL-XL) 25 MG 24 hr tablet, Take 1 tablet (25 mg total) by mouth daily., Disp: 90 tablet, Rfl: 3   ondansetron (ZOFRAN-ODT) 4 MG disintegrating tablet, Take 1 tablet (4 mg total) by mouth every 8 (eight) hours as needed for nausea or vomiting (for nausea from wegovy or other source)., Disp: 20 tablet, Rfl: 0   OVER THE COUNTER MEDICATION, Take 450 mg by  mouth daily. Burdock Root, Disp: , Rfl:    QUEtiapine (SEROQUEL) 25 MG tablet, TAKE 1 TABLET BY MOUTH EVERYDAY AT BEDTIME (Patient taking differently: as needed. TAKE 1 TABLET BY MOUTH EVERYDAY AT BEDTIME), Disp: 90 tablet, Rfl: 3   traZODone (DESYREL) 50 MG tablet, TAKE 0.5-1 TABLETS (25-50 MG TOTAL) BY MOUTH AT BEDTIME AS NEEDED. FOR SLEEP, Disp: 90 tablet, Rfl: 2   triamcinolone cream (KENALOG) 0.1 %, Apply 1 Application topically daily as needed., Disp: 453 g, Rfl: 11   vitamin E 180 MG (400 UNITS) capsule, Take 400 Units by mouth daily., Disp: , Rfl:    SUMAtriptan (IMITREX) 50 MG tablet, Take 1 tablet (50 mg total) by mouth daily as needed for migraine. May repeat in 2 hours if headache persists or recurs. (Patient not taking: Reported on 04/17/2022), Disp: 10 tablet, Rfl: 0  Allergies  Allergen Reactions   Lisinopril Cough   Hydrochlorothiazide Nausea And Vomiting   Losartan Nausea And Vomiting   Penicillins Rash and Other (See Comments)    Objective:   VITALS: Per patient if applicable, see vitals. GENERAL: Alert, appears well and in no acute distress. HEENT: Atraumatic, conjunctiva clear, no obvious abnormalities on inspection of external nose and ears. NECK: Normal movements of the head and neck. CARDIOPULMONARY: No increased WOB. Speaking in clear sentences. I:E ratio WNL.  MS: Moves all visible extremities without noticeable abnormality. PSYCH: Pleasant and cooperative, well-groomed. Speech normal rate and rhythm. Affect is appropriate. Insight and judgement are appropriate. Attention is focused, linear, and appropriate.  NEURO: CN grossly intact. Oriented as arrived to appointment on time with no prompting. Moves both UE equally.  SKIN: No obvious lesions, wounds, erythema, or cyanosis noted on face or hands.  Assessment and Plan:   Makeesha was seen today for covid positive.  Diagnoses and all orders for this visit:  COVID-19 -     POC COVID-19   Given concerns for  dehydration and already taking anti-emetic without relief, recommend ER evaluation for further management and care Patient is agreeable to plan  I discussed the assessment and treatment plan with the patient. The patient was provided an opportunity to ask questions and all were answered. The patient agreed with the plan and demonstrated an understanding of the instructions.   The patient was advised to call back or seek an in-person evaluation if the symptoms worsen or if the condition fails to improve as anticipated.   [place], [place] 04/17/2022

## 2022-04-17 NOTE — Discharge Instructions (Addendum)
You have been seen today in the Emergency Department (ED)  for lightheadedness and feeling unwell with COVID.  Your workup including labs and EKG show reassuring results.  Your symptoms may be due to dehydration, so it is important that you drink plenty of non-alcoholic fluids. It is also important to avoid drugs and alcohol.  Additionally, your heart rate was slightly low.  Follow-up with cardiology in the outpatient setting.  Do not take your metoprolol at this time.  You can increase your hydralazine to 50 mg to help with high blood pressure.  Please call your regular doctor as soon as possible to schedule the next available clinic appointment to follow up with him/her regarding your visit to the ED and your symptoms. You should ideally follow up with your primary doctor within one week.  Return to the Emergency Department (ED)  if you have any syncopal episodes (pass out ) or develop ANY worsening chest pain, pressure, tightness, palpitations, trouble breathing, sudden sweating, or other symptoms that concern you.

## 2022-04-17 NOTE — ED Notes (Signed)
Pt able to ambulate... Pt denied having SOB, CP, or any numbness/tingling while being ambulated... Pt state that she was feeling better.Rebecca KitchenMarland Collier

## 2022-04-20 ENCOUNTER — Encounter (HOSPITAL_BASED_OUTPATIENT_CLINIC_OR_DEPARTMENT_OTHER): Payer: Self-pay | Admitting: Cardiovascular Disease

## 2022-04-20 ENCOUNTER — Ambulatory Visit (HOSPITAL_BASED_OUTPATIENT_CLINIC_OR_DEPARTMENT_OTHER): Payer: 59 | Admitting: Cardiovascular Disease

## 2022-04-20 VITALS — BP 157/105 | HR 86 | Ht 66.0 in | Wt 347.6 lb

## 2022-04-20 DIAGNOSIS — I10 Essential (primary) hypertension: Secondary | ICD-10-CM

## 2022-04-20 DIAGNOSIS — Z5181 Encounter for therapeutic drug level monitoring: Secondary | ICD-10-CM

## 2022-04-20 DIAGNOSIS — R0683 Snoring: Secondary | ICD-10-CM

## 2022-04-20 MED ORDER — SPIRONOLACTONE 25 MG PO TABS: 25.0000 mg | ORAL_TABLET | Freq: Every day | ORAL | 3 refills | Status: DC

## 2022-04-20 NOTE — Patient Instructions (Addendum)
Medication Instructions:  STOP HYDRALAZINE  STOP METOPROLOL   START SPIRONOLACTONE 25 MG DAILY   Labwork: BMET IN 1 WEEK   Testing/Procedures: [facility] HOME SLEEP STUDY   Follow-Up: 05/16/2022 11:30 AM WITH PHARM D   Any Other Special Instructions Will Be Listed Below (If Applicable). MONITOR YOUR BLOOD PRESSURE TWICE A DAY AND BRING YOUR READINGS ALONG WITH MACHINE TO YOUR FOLLOW UP APPOINTMENT    WatchPAT?  Is a FDA cleared portable home sleep study test that uses a watch and 3 points of contact to monitor 7 different channels, including your heart rate, oxygen saturations, body position, snoring, and chest motion.  The study is easy to use from the comfort of your own home and accurately detect sleep apnea.  Before bed, you attach the chest sensor, attached the sleep apnea bracelet to your nondominant hand, and attach the finger probe.  After the study, the raw data is downloaded from the watch and scored for apnea events.   For more information: [email or web address]  Patient Testing Instructions:  Do not put battery into the device until bedtime when you are ready to begin the test. Please call the support number if you need assistance after following the instructions below: 24 hour support line- [phone] or [facility] support at [phone] (option 2)  Download the [employer] AmericanWatchPAT One" app through the google play store or App Store  Be sure to turn on or enable access to bluetooth in [place] on your smartphone/ device  Make sure no other bluetooth devices are on and within the vicinity of your smartphone/ device and WatchPAT watch during testing.  Make sure to leave your smart phone/ device plugged in and charging all night.  When ready for bed:  Follow the instructions step by step in the WatchPAT One App to activate the testing device. For additional instructions, including video instruction, visit the WatchPAT One video on Youtube. You can search for  [place] One within Youtube (video is 4 minutes and 18 seconds) or enter: https://youtube/watch?v=BCce_vbiwxE Please note: You will be prompted to enter a Pin to connect via bluetooth when starting the test. The PIN will be assigned to you when you receive the test.  The device is disposable, but it recommended that you retain the device until you receive a call letting you know the study has been received and the results have been interpreted.  We will let you know if the study did not transmit to [place] properly after the test is completed. You do not need to call us to confirm the receipt of the test.  Please complete the test within 48 hours of receiving PIN.   Frequently Asked Questions:  What is Watch [place] one?  A single use fully disposable home sleep apnea testing device and will not need to be returned after completion.  What are the requirements to use WatchPAT one?  The be able to have a successful watchpat one sleep study, you should have your Watch pat one device, your smart phone, watch pat one app, your PIN number and Internet access What type of phone do I need?  You should have a smart phone that uses Android 5.1 and above or any Iphone with IOS 10 and above How can I download the WatchPAT one app?  Based on your device type search for WatchPAT one app either in google play for android devices or APP store for Iphone's Where will I get my PIN for the study?  Your PIN will be provided by your  physician's office. It is used for authentication and if you lose/forget your PIN, please reach out to your providers office.  I do not have Internet at home. Can I do WatchPAT one study?  WatchPAT One needs Internet connection throughout the night to be able to transmit the sleep data. You can use your home/local internet or your cellular's data package. However, it is always recommended to use home/local Internet. It is estimated that between 20MB-30MB will be used with each study.However, the  application will be looking for 80MB space in the phone to start the study.  What happens if I lose internet or bluetooth connection?  During the internet disconnection, your phone will not be able to transmit the sleep data. All the data, will be stored in your phone. As soon as the internet connection is back on, the phone will being sending the sleep data. During the bluetooth disconnection, WatchPAT one will not be able to to send the sleep data to your phone. Data will be kept in the [facility] one until two devices have bluetooth connection back on. As soon as the connection is back on, WatchPAT one will send the sleep data to the phone.  How long do I need to wear the WatchPAT one?  After you start the study, you should wear the device at least 6 hours.  How far should I keep my phone from the device?  During the night, your phone should be within 15 feet.  What happens if I leave the room for restroom or other reasons?  Leaving the room for any reason will not cause any problem. As soon as your get back to the room, both devices will reconnect and will continue to send the sleep data. Can I use my phone during the sleep study?  Yes, you can use your phone as usual during the study. But it is recommended to put your watchpat one on when you are ready to go to bed.  How will I get my study results?  A soon as you completed your study, your sleep data will be sent to the provider. They will then share the results with you when they are ready.

## 2022-04-20 NOTE — Progress Notes (Signed)
Advanced Hypertension Clinic Follow-up:    Date:  04/20/2022   ID:  Rebecca Collier, DOB Sep 06, 1975, MRN [id number]  PCP:  Loralee Pacas, MD  Cardiologist:  Skeet Latch, MD  Nephrologist:  Referring MD: Elgie Congo, MD   CC: Hypertension  History of Present Illness:    Rebecca Collier is a 47 y.o. adult with a hx of hypertension, and anxiety, here for follow-up. They initially saw Coletta Memos, NP 04/2021 after being seen in the ED for shortness of breath. They had recently been treated for COVID infection with steroids and Paxlovid. Blood pressures were uncontrolled and they were referred to the [facility] Hypertension Clinic. They were started on metoprolol in addition to amlodipine and hydralazine and a sleep study was ordered. They were seen in the ED 03/2022 with chest pain and cough in the setting of recent COVID infection. Cardiac enzymes were negative and they were instructed to follow up with cardiology.  Today, they are accompanied by their mother. They had assumed they were here today for bradycardia. In clinic their blood pressure is elevated to 157/105. At home they see readings usually averaging 130s/70s when they are not sick. They have been on hydralazine since the last visit. Usually they exercise at home for 30 minutes a day with push-ups, sit-ups, and squats. When they go to the gym, they will use the elliptical. Regarding diet they cook at home and monitor salt intake. They don't add any salt. If they go to a restaurant they will try to opt for salads. Rare alcohol consumption on social occasions. They endorse snoring sometimes; has been told that they do and do not snore, maybe depending on level of fatigue. 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 Previously they deferred sleep studies as they wasn't sure if they'd be able to sleep in the office. They deny any palpitations, chest pain, shortness of breath, or peripheral edema. No  lightheadedness, headaches, syncope, orthopnea, or PND.  Previous antihypertensives: Lisinopril Losartan  HCTZ  Past Medical History:  Diagnosis Date   Allergy    Anxiety    Arthritis    Asthma    since COVID   Eczema    Hypertension    Obesity    Sciatica    spasms daily in left leg     Past Surgical History:  Procedure Laterality Date   NO PAST SURGERIES     WISDOM TOOTH EXTRACTION      Current Medications: Current Meds  Medication Sig   acetaminophen (TYLENOL) 500 MG tablet Take 1,000 mg by mouth every 6 (six) hours as needed for moderate pain.   albuterol (VENTOLIN HFA) 108 (90 Base) MCG/ACT inhaler Inhale 2 puffs into the lungs every 4 (four) hours as needed for wheezing or shortness of breath.   amLODipine (NORVASC) 10 MG tablet Take 1 tablet (10 mg total) by mouth daily.   BLACK CURRANT SEED OIL PO Take 1,300 mg by mouth 2 (two) times daily.   cyanocobalamin (VITAMIN B12) 1000 MCG tablet Take 1 tablet (1,000 mcg total) by mouth daily.   Dupilumab (DUPIXENT) 300 MG/2ML SOPN Inject 300 mg into the skin every 14 (fourteen) days. Starting at day 15 for maintenance.   meloxicam (MOBIC) 7.5 MG tablet TAKE 1 TABLET BY MOUTH TWICE A DAY FOR 2 WEEKS AND THEN AS NEEDED   ondansetron (ZOFRAN-ODT) 4 MG disintegrating tablet Take 1 tablet (4 mg total) by mouth every 8 (eight) hours as needed for nausea  or vomiting (for nausea from wegovy or other source).   OVER THE COUNTER MEDICATION Take 450 mg by mouth daily. Burdock Root   QUEtiapine (SEROQUEL) 25 MG tablet TAKE 1 TABLET BY MOUTH EVERYDAY AT BEDTIME (Patient taking differently: as needed. TAKE 1 TABLET BY MOUTH EVERYDAY AT BEDTIME)   spironolactone (ALDACTONE) 25 MG tablet Take 1 tablet (25 mg total) by mouth daily.   SUMAtriptan (IMITREX) 50 MG tablet Take 1 tablet (50 mg total) by mouth daily as needed for migraine. May repeat in 2 hours if headache persists or recurs.   traZODone (DESYREL) 50 MG tablet TAKE 0.5-1 TABLETS  (25-50 MG TOTAL) BY MOUTH AT BEDTIME AS NEEDED. FOR SLEEP   triamcinolone cream (KENALOG) 0.1 % Apply 1 Application topically daily as needed.   vitamin E 180 MG (400 UNITS) capsule Take 400 Units by mouth daily.   [DISCONTINUED] hydrALAZINE (APRESOLINE) 25 MG tablet Take 1 tablet (25 mg total) by mouth 3 (three) times daily.   [DISCONTINUED] metoprolol succinate (TOPROL-XL) 25 MG 24 hr tablet Take 1 tablet (25 mg total) by mouth daily.     Allergies:   Lisinopril, Hydrochlorothiazide, Losartan, and Penicillins   Social History   Socioeconomic History   Marital status: Single    Spouse name: Not on file   Number of children: Not on file   Years of education: Not on file   Highest education level: Not on file  Occupational History   Not on file  Tobacco Use   Smoking status: Never   Smokeless tobacco: Never  Vaping Use   Vaping Use: Never used  Substance and Sexual Activity   Alcohol use: Not Currently    Comment: socially   Drug use: Yes    Types: Marijuana    Comment: occasional   Sexual activity: Not Currently    Birth control/protection: None  Other Topics Concern   Not on file  Social History Narrative   Work or School: Quarry manager, Physicist, 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      Home Situation: lives with mother      Spiritual Beliefs: none      Lifestyle: no regular exercise; diet is ok         Social Determinants of Radio broadcast assistant Strain: Not on file  Food Insecurity: Not on file  Transportation Needs: Not on file  Physical Activity: Not on file  Stress: Not on file  Social Connections: Not on file     Family History: The patient's family history includes Cancer in his mother; Colon cancer in his mother; Healthy in his brother and father; Hypertension in his mother.  ROS:   Please see the history of present illness.    (+) Cough (+) Fatigue All other systems reviewed and are negative.  EKGs/Labs/Other Studies Reviewed:    CTA Chest   15-Apr-2021: FINDINGS: Cardiovascular: Adequate contrast bolus timing. Evaluation of the segmental and subsegmental pulmonary arterial branches is limited secondary to beam hardening artifact related to patient habitus and respiratory motion artifact. No filling defect is identified to the lobar branch level. Thoracic aorta is normal in course and caliber. Heart size is normal. No pericardial effusion.  IMPRESSION: 1. Slightly limited exam. No PE is identified to the lobar branch level. 2. Lungs are clear.  RLE Venous Doppler  12/14/2020: IMPRESSION: No evidence of right lower extremity deep venous thrombosis.  EKG:  EKG is personally reviewed. 04/20/2022: EKG was not ordered.  Recent Labs: 04/27/2021: TSH 1.90 04/17/2022: ALT 25; BUN  5; Creatinine, Ser 0.85; Hemoglobin 13.0; Platelets 254; Potassium 4.0; Sodium 139   Recent Lipid Panel    Component Value Date/Time   CHOL 199 04/27/2021 1344   CHOL 227 (H) 01/02/2020 1115   TRIG 113.0 04/27/2021 1344   HDL 55.00 04/27/2021 1344   HDL 70 01/02/2020 1115   CHOLHDL 4 04/27/2021 1344   VLDL 22.6 04/27/2021 1344   LDLCALC 122 (H) 04/27/2021 1344   LDLCALC 146 (H) 01/02/2020 1115    Physical Exam:    VS:  BP (!) 157/105 (BP Location: Right Arm, Patient Position: Sitting, Cuff Size: Large)   Pulse 86   Ht 5' 6"$  (1.676 m)   Wt (!) 347 lb 9.6 oz (157.7 kg)   LMP 03/27/2022 (Approximate)   SpO2 97%   BMI 56.10 kg/m  , BMI Body mass index is 56.1 kg/m. GENERAL:  Well appearing HEENT: Pupils equal round and reactive, fundi not visualized, oral mucosa unremarkable NECK:  No jugular venous distention, waveform within normal limits, carotid upstroke brisk and symmetric, no bruits, no thyromegaly LUNGS:  Clear to auscultation bilaterally HEART:  RRR.  PMI not displaced or sustained,S1 and S2 within normal limits, no S3, no S4, no clicks, no rubs, no murmurs ABD:  Flat, positive bowel sounds normal in frequency in pitch, no bruits, no  rebound, no guarding, no midline pulsatile mass, no hepatomegaly, no splenomegaly EXT:  2 plus pulses throughout, no edema, no cyanosis, no clubbing SKIN:  No rashes, no nodules NEURO:  Cranial nerves II through XII grossly intact, motor grossly intact throughout PSYCH:  Cognitively intact, oriented to person place and time   ASSESSMENT/PLAN:    Essential hypertension, benign Blood pressure is uncontrolled.  They are doing a good job of getting regular exercise.  Recommended increasing cardio to at least 150 minutes weekly.  Continue to limit sodium in diet.  Continue amlodipine.  Will discontinue hydralazine and start spironolactone 25 mg daily.  Stop metoprolol due to bradycardia, though they have been asymptomatic.  Check blood pressures twice a day.  They are given an advance hypertension clinic booklet and asked to bring to follow-up.  Blood pressure goal is less than 130/80.  They note that blood pressure is typically lower at home than it is in the office.  Morbid obesity ([place]) Recommend increasing cardio.  Check sleep study.  Snoring Patient reports snoring.  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  They do not think they could do an in lab sleep study.  We will get an Itamar study.   Screening for Secondary Hypertension:     04/27/2021    3:43 PM 04/20/2022    9:37 AM  Causes  Drugs/Herbals Screened Screened     - Comments Instructed to stop green she with ginseng limits sodium intake.  rare caffeine.  rare EtOH.  no tobacco.  Sleep Apnea Not Screened      - Comments Ordered split-night sleep study   Thyroid Disease Screened   Compliance Screened     Relevant Labs/Studies:    Latest Ref Rng & Units 04/17/2022   11:25 AM 04/27/2021    1:44 PM 04/18/2021   11:47 AM  Basic Labs  Sodium 135 - 145 mmol/L 139  137  135   Potassium 3.5 - 5.1 mmol/L 4.0  4.0  4.1   Creatinine 0.44 - 1.00 mg/dL 0.85  0.95  1.06        Latest Ref Rng & Units 04/27/2021    1:44  PM  03/19/2019    8:39 AM  Thyroid   TSH 0.35 - 5.50 uIU/mL 1.90  0.744       Disposition:    FU with APP/PharmD in 1 month for the next 3 months.   FU with Sundee Garland C. Oval Linsey, MD, [facility] in 4 months.  Medication Adjustments/Labs and Tests Ordered: Current medicines are reviewed at length with the patient today.  Concerns regarding medicines are outlined above.   Orders Placed This Encounter  Procedures   Basic metabolic panel   Itamar Sleep Study   Meds ordered this encounter  Medications   spironolactone (ALDACTONE) 25 MG tablet    Sig: Take 1 tablet (25 mg total) by mouth daily.    Dispense:  90 tablet    Refill:  3    D/C METOPROLOL AND HYDRALAZINE    I,Mathew Stumpf,acting as a scribe for Skeet Latch, MD.,have documented all relevant documentation on the behalf of Skeet Latch, MD,as directed by  Skeet Latch, MD while in the presence of Skeet Latch, MD.  I, [place] Oval Linsey, MD have reviewed all documentation for this visit.  The documentation of the exam, diagnosis, procedures, and orders on 04/20/2022 are all accurate and complete.   Signed, Skeet Latch, MD  04/20/2022 12:38 PM    [facility]

## 2022-04-20 NOTE — Assessment & Plan Note (Signed)
Blood pressure is uncontrolled.  They are doing a good job of getting regular exercise.  Recommended increasing cardio to at least 150 minutes weekly.  Continue to limit sodium in diet.  Continue amlodipine.  Will discontinue hydralazine and start spironolactone 25 mg daily.  Stop metoprolol due to bradycardia, though they have been asymptomatic.  Check blood pressures twice a day.  They are given an advance hypertension clinic booklet and asked to bring to follow-up.  Blood pressure goal is less than 130/80.  They note that blood pressure is typically lower at home than it is in the office.

## 2022-04-20 NOTE — Assessment & Plan Note (Addendum)
Patient reports snoring.  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  They do not think they could do an in lab sleep study.  We will get an Itamar study.

## 2022-04-20 NOTE — Assessment & Plan Note (Signed)
Recommend increasing cardio.  Check sleep study.

## 2022-04-24 ENCOUNTER — Telehealth: Payer: Self-pay | Admitting: *Deleted

## 2022-04-24 NOTE — Telephone Encounter (Signed)
Secure chat message sent to Alvina Filbert ok to activate itamar device.

## 2022-04-28 ENCOUNTER — Ambulatory Visit: Payer: 59 | Admitting: Internal Medicine

## 2022-05-02 DIAGNOSIS — Z5181 Encounter for therapeutic drug level monitoring: Secondary | ICD-10-CM | POA: Diagnosis not present

## 2022-05-02 DIAGNOSIS — I10 Essential (primary) hypertension: Secondary | ICD-10-CM | POA: Diagnosis not present

## 2022-05-02 LAB — BASIC METABOLIC PANEL
BUN/Creatinine Ratio: 15 (ref 9–23)
BUN: 16 mg/dL (ref 6–24)
CO2: 19 mmol/L — ABNORMAL LOW (ref 20–29)
Calcium: 10.4 mg/dL — ABNORMAL HIGH (ref 8.7–10.2)
Chloride: 103 mmol/L (ref 96–106)
Creatinine, Ser: 1.06 mg/dL — ABNORMAL HIGH (ref 0.57–1.00)
Glucose: 134 mg/dL — ABNORMAL HIGH (ref 70–99)
Potassium: 4.3 mmol/L (ref 3.5–5.2)
Sodium: 137 mmol/L (ref 134–144)
eGFR: 66 mL/min/{1.73_m2} (ref >59)

## 2022-05-15 NOTE — Progress Notes (Signed)
Office Visit    Patient Name: Rebecca Collier Date of Encounter: 05/16/2022  Primary Care Provider:  Loralee Pacas, MD Primary Cardiologist:  Skeet Latch, MD  Chief Complaint    Hypertension - Advanced hypertension clinic  Past Medical History   obesity   asthma On Dupixent q14d, albuterol mdi  arthritis On prn meloxicam  migraine On imitrex       Allergies  Allergen Reactions   Lisinopril Cough   Hydrochlorothiazide Nausea And Vomiting   Losartan Nausea And Vomiting   Penicillins Rash and Other (See Comments)    History of Present Illness    Rebecca Collier is a 47 y.o. adult patient who was referred to the Advanced Hypertension Clinic by Dr. Georgina Snell.  They were first seen in cardiology by Coletta Memos about a year ago after ED visit for chest pain.  Most recently seen by Dr. Oval Linsey last month with pressure still uncontrolled at 157/105.  They were switched from metoprolol to spironolactone due to concerns for bradycardia and asked to monitor readings at home.  Patient works third shift and often gets home between 9-10 am and takes meds with meal between 10 an noon.    Will usually sleep from noon to about 7-8 pm.  Today they have not yet taken their daily medications.  Has not been able to check BP at home, as batteries were dead and has not remembered to get some at the store.  Will try to get that today.    Blood Pressure Goal:  130/80  Current Medications: amlodipine 10 mg qd, spironolactone 25 mg qd, - takes in am after work  Adherence Assessment  Do you ever forget to take your medication? [] Yes [x] No  Do you ever skip doses due to side effects? [] Yes [x] No  Do you have trouble affording your medicines? [] Yes [x] No  Are you ever unable to pick up your medication due to transportation difficulties? [] Yes [x] No  Do you ever stop taking your medications because you don't believe they are helping? [] Yes [x] No   Adherence strategy: 7 day pill  minder  Previously tried:   lisinopril - cough; hctz - n/v, losartan - n/v  Family Hx: mother also has hypertension - controlled, father HLD, mom too; borther with hypertension, older;   Social Hx:      Tobacco: none  Alcohol: rare- only social occasions  Caffeine:  none  Diet:  mostly home cooked meals, more from scratch; vegetables fresh, frozen and canned;  Exercise: 30 min/day - sit ups, push-ups, squats, gym time, elliptical, trying to walk in neighborhood; walks 5,000-10,000 steps nightly at work  Home BP readings:    no batteries   Accessory Clinical Findings    Lab Results  Component Value Date   CREATININE 1.06 (H) 05/02/2022   BUN 16 05/02/2022   NA 137 05/02/2022   K 4.3 05/02/2022   CL 103 05/02/2022   CO2 19 (L) 05/02/2022   Lab Results  Component Value Date   ALT 25 04/17/2022   AST 17 04/17/2022   ALKPHOS 107 04/17/2022   BILITOT 0.4 04/17/2022   Lab Results  Component Value Date   HGBA1C 5.9 04/27/2021    Screening for Secondary Hypertension:      04/27/2021    3:43 PM 04/20/2022    9:37 AM  Causes  Drugs/Herbals Screened Screened     - Comments Instructed to stop green she with ginseng limits sodium intake.  rare caffeine.  rare EtOH.  no  tobacco.  Sleep Apnea Not Screened      - Comments Ordered split-night sleep study   Thyroid Disease Screened   Compliance Screened     Relevant Labs/Studies:    Latest Ref Rng & Units 05/02/2022    8:55 AM 04/17/2022   11:25 AM 04/27/2021    1:44 PM  Basic Labs  Sodium 134 - 144 mmol/L 137  139  137   Potassium 3.5 - 5.2 mmol/L 4.3  4.0  4.0   Creatinine 0.57 - 1.00 mg/dL 1.06  0.85  0.95        Latest Ref Rng & Units 04/27/2021    1:44 PM 03/19/2019    8:39 AM  Thyroid   TSH 0.35 - 5.50 uIU/mL 1.90  0.744                   Home Medications    Current Outpatient Medications  Medication Sig Dispense Refill   albuterol (VENTOLIN HFA) 108 (90 Base) MCG/ACT inhaler Inhale 2 puffs into the lungs  every 4 (four) hours as needed for wheezing or shortness of breath. 18 g 2   amLODipine (NORVASC) 10 MG tablet Take 1 tablet (10 mg total) by mouth daily. 90 tablet 3   BLACK CURRANT SEED OIL PO Take 1,300 mg by mouth 2 (two) times daily.     Cholecalciferol (VITAMIN D) 50 MCG (2000 UT) CAPS Take 1 capsule by mouth daily.     cyanocobalamin (VITAMIN B12) 1000 MCG tablet Take 1 tablet (1,000 mcg total) by mouth daily. 90 tablet 3   meloxicam (MOBIC) 7.5 MG tablet TAKE 1 TABLET BY MOUTH TWICE A DAY FOR 2 WEEKS AND THEN AS NEEDED 90 tablet 3   Noni 250 MG CAPS Take 1 capsule by mouth daily.     ondansetron (ZOFRAN-ODT) 4 MG disintegrating tablet Take 1 tablet (4 mg total) by mouth every 8 (eight) hours as needed for nausea or vomiting (for nausea from wegovy or other source). 20 tablet 0   OVER THE COUNTER MEDICATION Take 450 mg by mouth daily. Burdock Root     QUEtiapine (SEROQUEL) 25 MG tablet TAKE 1 TABLET BY MOUTH EVERYDAY AT BEDTIME (Patient taking differently: as needed. TAKE 1 TABLET BY MOUTH EVERYDAY AT BEDTIME) 90 tablet 3   spironolactone (ALDACTONE) 25 MG tablet Take 1 tablet (25 mg total) by mouth daily. 90 tablet 3   SUMAtriptan (IMITREX) 50 MG tablet Take 1 tablet (50 mg total) by mouth daily as needed for migraine. May repeat in 2 hours if headache persists or recurs. 10 tablet 0   traZODone (DESYREL) 50 MG tablet TAKE 0.5-1 TABLETS (25-50 MG TOTAL) BY MOUTH AT BEDTIME AS NEEDED. FOR SLEEP 90 tablet 2   triamcinolone cream (KENALOG) 0.1 % Apply 1 Application topically daily as needed. 453 g 11   valsartan (DIOVAN) 80 MG tablet Take 1 tablet (80 mg total) by mouth daily. 90 tablet 1   vitamin E 180 MG (400 UNITS) capsule Take 400 Units by mouth daily.     Dupilumab (DUPIXENT) 300 MG/2ML SOPN Inject 300 mg into the skin every 14 (fourteen) days. Starting at day 15 for maintenance. 2 mL 0   No current facility-administered medications for this visit.     Assessment & Plan    HYPERTENSION CONTROL Vitals:   05/16/22 1146 05/16/22 1150  BP: (!) 152/86 (!) 145/91    The patient's blood pressure is elevated above target today.  In order to address the patient's elevated  BP: A new medication was prescribed today.; Blood pressure will be monitored at home to determine if medication changes need to be made.      Essential hypertension, benign Assessment: BP is uncontrolled in office BP 152/86 mmHg;  above the goal (<130/80). Tolerates amlodipine, spironolactone well without any side effects Denies SOB, palpitation, chest pain, headaches,or swelling Reiterated the importance of regular exercise and low salt diet   Plan:  Start taking valsartan 80 mg once daily Continue taking amlodipine and spironolactone Patient to keep record of BP readings with heart rate and report to [place] at the next visit Patient to follow up with PharmD in 6 weeks for follow up - at [facility] office Labs ordered today:   BMET 2-3 weeks   Tommy Medal PharmD CPP [place]  [address] [place], [place] [address] [phone]

## 2022-05-16 ENCOUNTER — Ambulatory Visit (HOSPITAL_BASED_OUTPATIENT_CLINIC_OR_DEPARTMENT_OTHER): Payer: 59 | Admitting: Pharmacist Clinician (PhC)/ Clinical Pharmacy Specialist

## 2022-05-16 ENCOUNTER — Encounter (HOSPITAL_BASED_OUTPATIENT_CLINIC_OR_DEPARTMENT_OTHER): Payer: Self-pay | Admitting: Pharmacist Clinician (PhC)/ Clinical Pharmacy Specialist

## 2022-05-16 VITALS — BP 145/91 | HR 72 | Ht 66.0 in | Wt 347.0 lb

## 2022-05-16 DIAGNOSIS — I10 Essential (primary) hypertension: Secondary | ICD-10-CM

## 2022-05-16 MED ORDER — VALSARTAN 80 MG PO TABS: 80.0000 mg | ORAL_TABLET | Freq: Every day | ORAL | 1 refills | Status: AC

## 2022-05-16 NOTE — Patient Instructions (Signed)
Follow up appointment: May 1 at 10:30 am at the [facility] office  Go to the lab in 2-3 weeks to check kidney function  Take your BP meds as follows:  Start valsartan 80 mg once daily  Continue with amlodipine and spironolactone  Check your blood pressure at home daily (if able) and keep record of the readings.  Hypertension "High blood pressure"  Hypertension is often called "The Silent Killer." It rarely causes symptoms until it is extremely  high or has done damage to other organs in the body. For this reason, you should have your  blood pressure checked regularly by your physician. We will check your blood pressure  every time you see a provider at one of our offices.   Your blood pressure reading consists of two numbers. Ideally, blood pressure should be  below 120/80. The first ("top") number is called the systolic pressure. It measures the  pressure in your arteries as your heart beats. The second ("bottom") number is called the diastolic pressure. It measures the pressure in your arteries as the heart relaxes between beats.  The benefits of getting your blood pressure under control are enormous. A 10-point  reduction in systolic blood pressure can reduce your risk of stroke by 27% and heart failure by 28%  Your blood pressure goal is < 130/80  To check your pressure at home you will need to:  1. Sit up in a chair, with feet flat on the floor and back supported. Do not cross your ankles or legs. 2. Rest your left arm so that the cuff is about heart level. If the cuff goes on your upper arm,  then just relax the arm on the table, arm of the chair or your lap. If you have a wrist cuff, we  suggest relaxing your wrist against your chest (think of it as Pledging the Flag with the  wrong arm).  3. Place the cuff snugly around your arm, about 1 inch above the crook of your elbow. The  cords should be inside the groove of your elbow.  4. Sit quietly, with the cuff in place, for  about 5 minutes. After that 5 minutes press the power  button to start a reading. 5. Do not talk or move while the reading is taking place.  6. Record your readings on a sheet of paper. Although most cuffs have a memory, it is often  easier to see a pattern developing when the numbers are all in front of you.  7. You can repeat the reading after 1-3 minutes if it is recommended  Make sure your bladder is empty and you have not had caffeine or tobacco within the last 30 min  Always bring your blood pressure log with you to your appointments. If you have not brought your monitor in to be double checked for accuracy, please bring it to your next appointment.  You can find a list of quality blood pressure cuffs at validatebp.org

## 2022-05-16 NOTE — Assessment & Plan Note (Signed)
Assessment: BP is uncontrolled in office BP 152/86 mmHg;  above the goal (<130/80). Tolerates amlodipine, spironolactone well without any side effects Denies SOB, palpitation, chest pain, headaches,or swelling Reiterated the importance of regular exercise and low salt diet   Plan:  Start taking valsartan 80 mg once daily Continue taking amlodipine and spironolactone Patient to keep record of BP readings with heart rate and report to [place] at the next visit Patient to follow up with PharmD in 6 weeks for follow up - at [place] office Labs ordered today:   BMET 2-3 weeks

## 2022-06-26 ENCOUNTER — Telehealth: Payer: Self-pay | Admitting: *Deleted

## 2022-06-26 NOTE — Telephone Encounter (Signed)
Message sent to Regis Bill to follow up on itamar device that as given to the patient in February. It has not been done.

## 2022-06-28 ENCOUNTER — Ambulatory Visit: Payer: 59 | Attending: Internal Medicine

## 2022-06-28 NOTE — Progress Notes (Deleted)
Office Visit    Patient Name: Rebecca Collier Date of Encounter: 06/28/2022  Primary Care Provider:  Lula Olszewski, MD Primary Cardiologist:  Chilton Si, MD  Chief Complaint    Hypertension - Advanced hypertension clinic  Past Medical History   obesity   asthma On Dupixent q14d, albuterol mdi  arthritis On prn meloxicam  migraine On imitrex       Allergies  Allergen Reactions   Lisinopril Cough   Hydrochlorothiazide Nausea And Vomiting   Losartan Nausea And Vomiting   Penicillins Rash and Other (See Comments)    History of Present Illness    Rebecca Collier is a 47 y.o. adult patient who was referred to the [facility]pertension Clinic by Dr. Vivien Rossetti.  They were first seen in cardiology by Edd Fabian about a year ago after ED visit for chest pain.  Most recently seen by Dr. Duke Salvia last month with pressure still uncontrolled at 157/105.  They were switched from metoprolol to spironolactone due to concerns for bradycardia and asked to monitor readings at home.  Patient works third shift and often gets home between 9-10 am and takes meds with meal between 10 an noon.    Will usually sleep from noon to about 7-8 pm.  At her last visit, BP was 157/105 and she had not yet take her medications for the day.  Had not recently checked her BP readings as the batteries in her device needed replacing.    Today they return for follow up.    Activation code [id number] For sleep study   Blood Pressure Goal:  130/80  Current Medications: amlodipine 10 mg qd, spironolactone 25 mg qd, - takes in am after work  Adherence Assessment  Do you ever forget to take your medication? [] Yes [x] No  Do you ever skip doses due to side effects? [] Yes [x] No  Do you have trouble affording your medicines? [] Yes [x] No  Are you ever unable to pick up your medication due to transportation difficulties? [] Yes [x] No  Do you ever stop taking your medications because you don't believe they are  helping? [] Yes [x] No   Adherence strategy: 7 day pill minder  Previously tried:   lisinopril - cough; hctz - n/v, losartan - n/v  Family Hx: mother also has hypertension - controlled, father HLD, mom too; borther with hypertension, older;   Social Hx:      Tobacco: none  Alcohol: rare- only social occasions  Caffeine:  none  Diet:  mostly home cooked meals, more from scratch; vegetables fresh, frozen and canned;  Exercise: 30 min/day - sit ups, push-ups, squats, gym time, elliptical, trying to walk in neighborhood; walks 5,000-10,000 steps nightly at work  Home BP readings:    no batteries   Accessory Clinical Findings    Lab Results  Component Value Date   CREATININE 1.06 (H) 05/02/2022   BUN 16 05/02/2022   NA 137 05/02/2022   K 4.3 05/02/2022   CL 103 05/02/2022   CO2 19 (L) 05/02/2022   Lab Results  Component Value Date   ALT 25 04/17/2022   AST 17 04/17/2022   ALKPHOS 107 04/17/2022   BILITOT 0.4 04/17/2022   Lab Results  Component Value Date   HGBA1C 5.9 04/27/2021    Screening for Secondary Hypertension:      04/27/2021    3:43 PM 04/20/2022    9:37 AM  Causes  Drugs/Herbals Screened Screened     - Comments Instructed to stop green she with ginseng  limits sodium intake.  rare caffeine.  rare EtOH.  no tobacco.  Sleep Apnea Not Screened      - Comments Ordered split-night sleep study   Thyroid Disease Screened   Compliance Screened     Relevant Labs/Studies:    Latest Ref Rng & Units 05/02/2022    8:55 AM 04/17/2022   11:25 AM 04/27/2021    1:44 PM  Basic Labs  Sodium 134 - 144 mmol/L 137  139  137   Potassium 3.5 - 5.2 mmol/L 4.3  4.0  4.0   Creatinine 0.57 - 1.00 mg/dL 1.61  0.96  0.45        Latest Ref Rng & Units 04/27/2021    1:44 PM 03/19/2019    8:39 AM  Thyroid   TSH 0.35 - 5.50 uIU/mL 1.90  0.744                   Home Medications    Current Outpatient Medications  Medication Sig Dispense Refill   albuterol (VENTOLIN HFA) 108  (90 Base) MCG/ACT inhaler Inhale 2 puffs into the lungs every 4 (four) hours as needed for wheezing or shortness of breath. 18 g 2   amLODipine (NORVASC) 10 MG tablet Take 1 tablet (10 mg total) by mouth daily. 90 tablet 3   BLACK CURRANT SEED OIL PO Take 1,300 mg by mouth 2 (two) times daily.     Cholecalciferol (VITAMIN D) 50 MCG (2000 UT) CAPS Take 1 capsule by mouth daily.     cyanocobalamin (VITAMIN B12) 1000 MCG tablet Take 1 tablet (1,000 mcg total) by mouth daily. 90 tablet 3   Dupilumab (DUPIXENT) 300 MG/2ML SOPN Inject 300 mg into the skin every 14 (fourteen) days. Starting at day 15 for maintenance. 2 mL 0   meloxicam (MOBIC) 7.5 MG tablet TAKE 1 TABLET BY MOUTH TWICE A DAY FOR 2 WEEKS AND THEN AS NEEDED 90 tablet 3   Noni 250 MG CAPS Take 1 capsule by mouth daily.     ondansetron (ZOFRAN-ODT) 4 MG disintegrating tablet Take 1 tablet (4 mg total) by mouth every 8 (eight) hours as needed for nausea or vomiting (for nausea from wegovy or other source). 20 tablet 0   OVER THE COUNTER MEDICATION Take 450 mg by mouth daily. Burdock Root     QUEtiapine (SEROQUEL) 25 MG tablet TAKE 1 TABLET BY MOUTH EVERYDAY AT BEDTIME (Patient taking differently: as needed. TAKE 1 TABLET BY MOUTH EVERYDAY AT BEDTIME) 90 tablet 3   spironolactone (ALDACTONE) 25 MG tablet Take 1 tablet (25 mg total) by mouth daily. 90 tablet 3   SUMAtriptan (IMITREX) 50 MG tablet Take 1 tablet (50 mg total) by mouth daily as needed for migraine. May repeat in 2 hours if headache persists or recurs. 10 tablet 0   traZODone (DESYREL) 50 MG tablet TAKE 0.5-1 TABLETS (25-50 MG TOTAL) BY MOUTH AT BEDTIME AS NEEDED. FOR SLEEP 90 tablet 2   triamcinolone cream (KENALOG) 0.1 % Apply 1 Application topically daily as needed. 453 g 11   valsartan (DIOVAN) 80 MG tablet Take 1 tablet (80 mg total) by mouth daily. 90 tablet 1   vitamin E 180 MG (400 UNITS) capsule Take 400 Units by mouth daily.     No current facility-administered medications  for this visit.     Assessment & Plan   No BP recorded.  {Refresh Note OR Click here to enter BP  :1}***   No problem-specific Assessment & Plan notes found for  this encounter.   Phillips Hay PharmD CPP [facility]  [address] [place], [place] [address] [phone]

## 2022-06-29 ENCOUNTER — Encounter: Payer: Self-pay | Admitting: Cardiovascular Disease

## 2022-08-01 NOTE — Telephone Encounter (Signed)
Left message to call back  Also needs to reschedule no show pharm D appointment

## 2022-10-11 ENCOUNTER — Telehealth: Payer: Self-pay

## 2022-10-11 NOTE — Telephone Encounter (Signed)
**Note De-Identified  Obfuscation** 3rd Attempt: Dr Duke Sal ordered a Itamar-HST at the the pts 04/20/22 office visit and the pt was given a Watch-PAT One-HST device on 04/20/22. She has not done the HST and has not returned the device to the office. I called her home number but it went straight to VM so I left a message on her VM asking her to call Larita Fife back at Dr Leonides Sake office at [facility] at [phone].  Per her DPR:  Patient also authorizes all [facility] to leave detailed message on cell phone voice mail [phone].  I called [phone] and a man answered the phone and would only say "I no understand english".  I have sent the pt a MYCHART message asking her to contact us if she wants to proceed with her HST and if not, to please return the Watch-Pat one to the office in its original unopened box or we will have to charge her $100.  If the pt does not call us to let us know if she plans to do the HST (so I can do a PA) and if she does not return the device to the office, I will refer to billing on 11/10/22.

## 2022-10-20 ENCOUNTER — Other Ambulatory Visit: Payer: Self-pay | Admitting: Internal Medicine

## 2022-10-20 DIAGNOSIS — J45909 Unspecified asthma, uncomplicated: Secondary | ICD-10-CM

## 2022-11-02 NOTE — Telephone Encounter (Signed)
Multiple attempts made to contact patient, no return call (see Aug 2024 message)

## 2022-11-22 NOTE — Telephone Encounter (Signed)
**Note De-Identified Dorna Mallet Obfuscation** I have referred to billing for unreturned Itamar-HST device and am removing the Itamar-HST from the pts active orders.

## 2023-06-15 ENCOUNTER — Other Ambulatory Visit: Payer: Self-pay | Admitting: Internal Medicine

## 2023-06-15 DIAGNOSIS — M1990 Unspecified osteoarthritis, unspecified site: Secondary | ICD-10-CM

## 2023-06-15 DIAGNOSIS — I10 Essential (primary) hypertension: Secondary | ICD-10-CM

## 2023-06-15 DIAGNOSIS — F5104 Psychophysiologic insomnia: Secondary | ICD-10-CM

## 2023-06-15 DIAGNOSIS — F411 Generalized anxiety disorder: Secondary | ICD-10-CM

## 2023-07-12 ENCOUNTER — Other Ambulatory Visit: Payer: Self-pay | Admitting: Internal Medicine

## 2023-07-12 DIAGNOSIS — F5104 Psychophysiologic insomnia: Secondary | ICD-10-CM

## 2023-07-12 DIAGNOSIS — F411 Generalized anxiety disorder: Secondary | ICD-10-CM

## 2023-09-03 ENCOUNTER — Other Ambulatory Visit (HOSPITAL_COMMUNITY): Payer: Self-pay

## 2023-09-03 ENCOUNTER — Other Ambulatory Visit: Payer: Self-pay

## 2023-09-04 ENCOUNTER — Other Ambulatory Visit: Payer: Self-pay | Admitting: Pharmacist

## 2023-09-04 ENCOUNTER — Other Ambulatory Visit (HOSPITAL_COMMUNITY): Payer: Self-pay

## 2023-09-04 ENCOUNTER — Encounter (HOSPITAL_COMMUNITY): Payer: Self-pay

## 2023-09-04 ENCOUNTER — Other Ambulatory Visit: Payer: Self-pay

## 2023-09-04 ENCOUNTER — Ambulatory Visit: Payer: Self-pay | Attending: Internal Medicine | Admitting: Pharmacist

## 2023-09-04 ENCOUNTER — Telehealth: Payer: Self-pay | Admitting: Pharmacist

## 2023-09-04 ENCOUNTER — Encounter: Payer: Self-pay | Admitting: Pharmacist

## 2023-09-04 ENCOUNTER — Other Ambulatory Visit: Payer: Self-pay | Admitting: Pharmacy Technician

## 2023-09-04 DIAGNOSIS — Z79899 Other long term (current) drug therapy: Secondary | ICD-10-CM

## 2023-09-04 DIAGNOSIS — L209 Atopic dermatitis, unspecified: Secondary | ICD-10-CM

## 2023-09-04 MED ORDER — DUPIXENT 300 MG/2ML ~~LOC~~ SOAJ
SUBCUTANEOUS | 6 refills | Status: AC
  Filled 2023-09-04 – 2023-09-11 (×2): qty 4, 28d supply, fill #0
  Filled 2023-10-02: qty 4, 28d supply, fill #1
  Filled 2023-11-05: qty 4, 28d supply, fill #2
  Filled 2023-12-11 (×2): qty 4, 28d supply, fill #3
  Filled 2024-01-02: qty 4, 28d supply, fill #4
  Filled 2024-01-31 – 2024-02-04 (×3): qty 4, 28d supply, fill #5
  Filled 2024-03-10 – 2024-03-14 (×2): qty 4, 28d supply, fill #6

## 2023-09-04 MED ORDER — DUPIXENT 300 MG/2ML ~~LOC~~ SOAJ
SUBCUTANEOUS | 6 refills | Status: DC
  Filled 2023-09-04 (×2): qty 4, 28d supply, fill #0

## 2023-09-04 NOTE — Telephone Encounter (Signed)
 Called patient to schedule an appointment for the [facility] [employer] Special[facility]ication [facility]. I was unable to reach the patient so I left a HIPAA-compliant message requesting that the patient return my call.   Butch Penny, PharmD, Patsy Baltimore, CPP Clinical Pharmacist [facility] & [facility] [phone]

## 2023-09-04 NOTE — Progress Notes (Signed)
 See OV for complete documentation.   Marene Shape, PharmD, Becky Bowels, CPP Clinical Pharmacist [facility] & [facility] [phone]

## 2023-09-04 NOTE — Progress Notes (Signed)
 Pharmacy Patient Advocate Encounter  Insurance verification completed.   The patient is insured through [facility]   Ran test claim for Dupixent . Co-pay is $0. Patient has copay and credit card   This test claim was processed through [facility]- copay amounts may vary at other pharmacies due to pharmacy/plan contracts, or as the patient moves through the different stages of their insurance plan.

## 2023-09-04 NOTE — Telephone Encounter (Signed)
Call returned, CHEP visit completed.

## 2023-09-04 NOTE — Telephone Encounter (Signed)
 Copied from CRM [id number]. Topic: General - Other >> Sep 04, 2023 10:59 AM Essie A wrote:  Reason for CRM: Patient is returning [employer]'s call.  Please return the call at [phone].

## 2023-09-04 NOTE — Progress Notes (Signed)
   S: Patient presents for review of their specialty medication therapy.  Patient is currently taking Dupixent  for atopic dermatitis. Patient is managed by Dr. Dietrich for this.   Adherence: confirms  Efficacy: reports that the medication works well  Dosing: 300 mg once every 14 days  Dose adjustments: Renal: no dose adjustments (has not been studied) Hepatic: no dose adjustments (has not been studied)  Drug-drug interactions: none identified  Screening: TB test: completed   Monitoring: S/sx of infection: none  S/sx of hypersensitivity: none  S/sx of ocular effects: none  S/sx of eosinophilia/vasculitis: none   O:     Lab Results  Component Value Date   WBC 3.3 (L) 04/17/2022   HGB 13.0 04/17/2022   HCT 39.9 04/17/2022   MCV 85.4 04/17/2022   PLT 254 04/17/2022      Chemistry      Component Value Date/Time   NA 137 05/02/2022 0855   K 4.3 05/02/2022 0855   CL 103 05/02/2022 0855   CO2 19 (L) 05/02/2022 0855   BUN 16 05/02/2022 0855   CREATININE 1.06 (H) 05/02/2022 0855      Component Value Date/Time   CALCIUM 10.4 (H) 05/02/2022 0855   ALKPHOS 107 04/17/2022 1125   AST 17 04/17/2022 1125   ALT 25 04/17/2022 1125   BILITOT 0.4 04/17/2022 1125   BILITOT <0.2 01/02/2020 1115       A/P: 1. Medication review: Patient currently on Dupixent  for atopic dermatitis. Reviewed the medication with the patient, including the following: Dupixent  is a monoclonal antibody used for the treatment of asthma or atopic dermatitis. Patient educated on purpose, proper use and potential adverse effects of Dupixent . Possible adverse effects include increased risk of infection, ocular effects, vasculitis/eosinophilia, and hypersensitivity reactions. Administer as a SubQ injection and rotate sites. Allow the medication to reach room temp prior to administration (45 mins for 300 mg syringe or 30 min for 200 mg syringe). Do not shake. Discard any unused portion. No recommendations for  any changes.   Herlene Fleeta Morris, PharmD, JAQUELINE, CPP Clinical Pharmacist [facility] & [facility] [phone]

## 2023-09-05 ENCOUNTER — Other Ambulatory Visit (HOSPITAL_COMMUNITY): Payer: Self-pay

## 2023-09-06 ENCOUNTER — Other Ambulatory Visit (HOSPITAL_COMMUNITY): Payer: Self-pay

## 2023-09-11 ENCOUNTER — Other Ambulatory Visit (HOSPITAL_COMMUNITY): Payer: Self-pay

## 2023-09-11 ENCOUNTER — Encounter (HOSPITAL_COMMUNITY): Payer: Self-pay

## 2023-09-11 ENCOUNTER — Other Ambulatory Visit: Payer: Self-pay

## 2023-09-11 NOTE — Progress Notes (Signed)
 Specialty Pharmacy Initial Fill Coordination Note  Rebecca Collier is a 48 y.o. adult contacted today regarding initial fill of specialty medication(s) Dupilumab  (Dupixent )   Patient requested Delivery   Delivery date: 09/12/23   Verified address: [address]   Medication will be filled on 7/15.   Patient is aware of $0 copayment.

## 2023-09-13 ENCOUNTER — Other Ambulatory Visit: Payer: Self-pay

## 2023-09-14 IMAGING — CT CT ANGIO CHEST
2 of 6 series · 18 of 36 positions shown · IV contrast (agent unspecified)
Comparison: Same day chest x-ray

CLINICAL DATA: Shortness of breath, chest pain. Pulmonary embolism
(PE) suspected, positive D-dimer

EXAM:
CT ANGIOGRAPHY CHEST WITH CONTRAST
TECHNIQUE: Multidetector CT imaging of the chest was performed using the
standard protocol during bolus administration of intravenous
contrast. Multiplanar CT image reconstructions and MIPs were
obtained to evaluate the vascular anatomy.

[Series 5: thins · axial · 0.69mm/px · z∈[-93,+160]mm · 17 of 285 slices shown]
[im 16/285  lung]
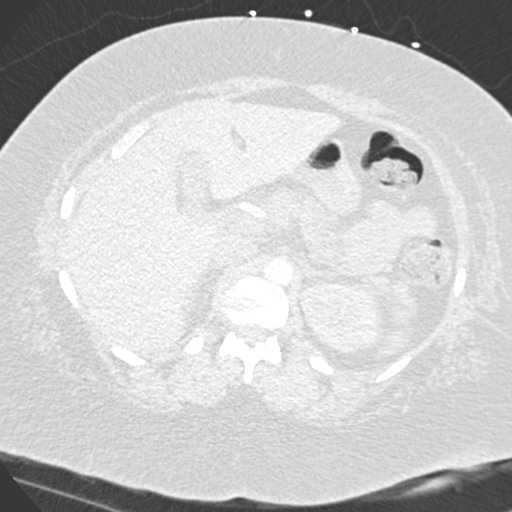
[im 32/285  mediastinal]
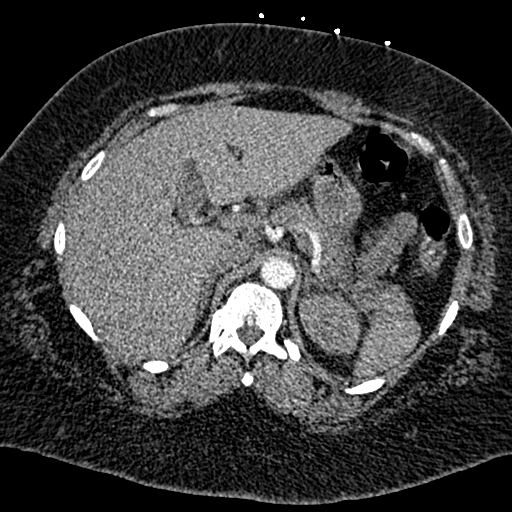
[im 48/285  lung]
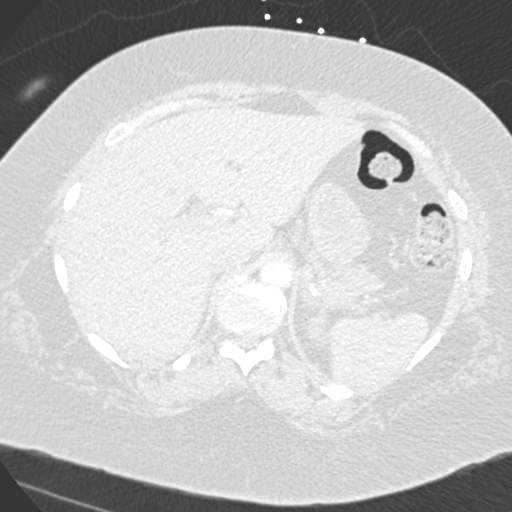
[im 64/285  mediastinal]
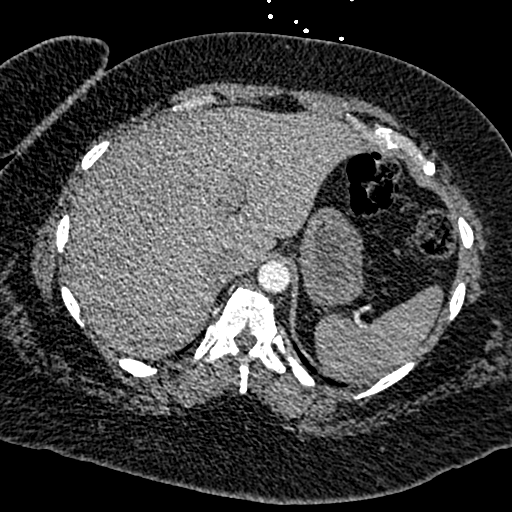
[im 79/285  lung]
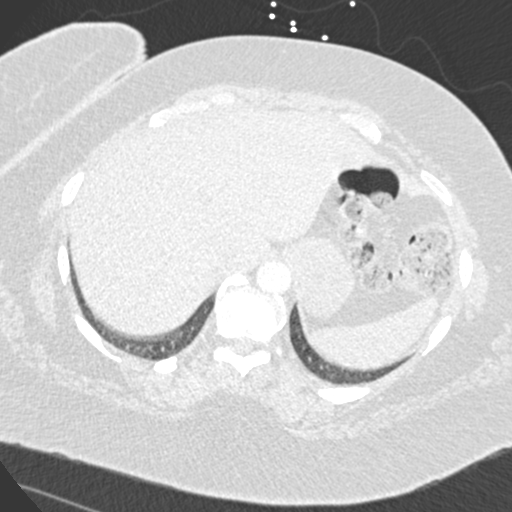
[im 95/285  mediastinal]
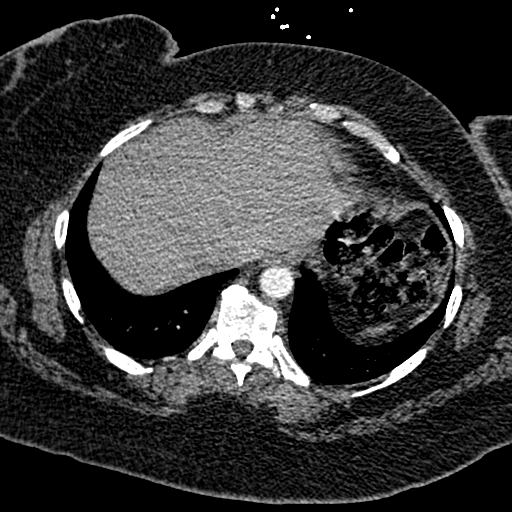
[im 111/285  lung]
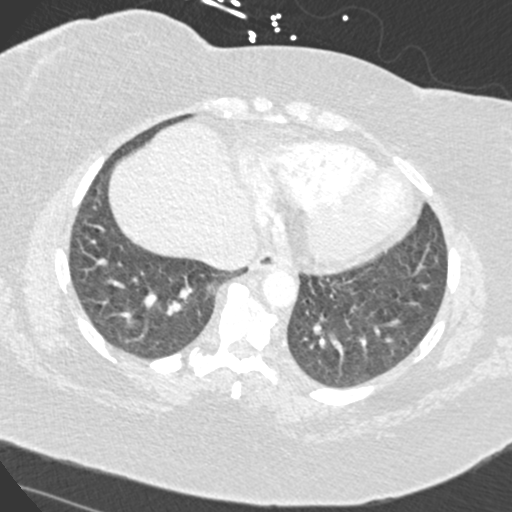
[im 127/285  mediastinal]
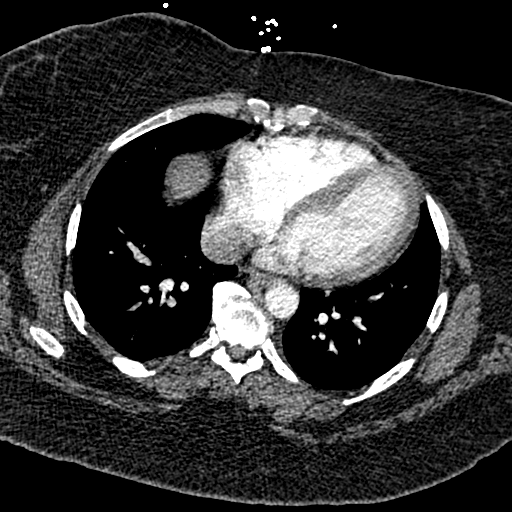
[im 143/285  lung]
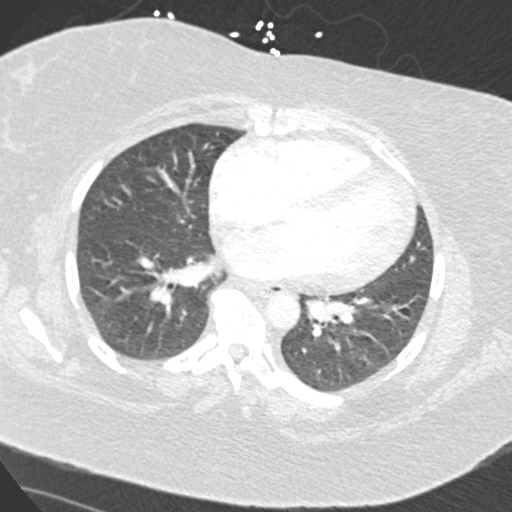
[im 158/285  mediastinal]
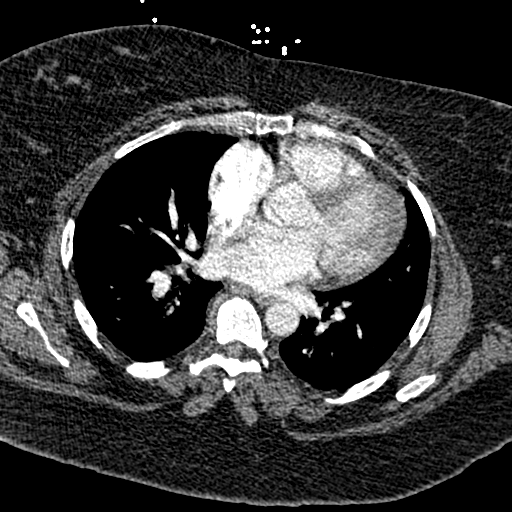
[im 174/285  lung]
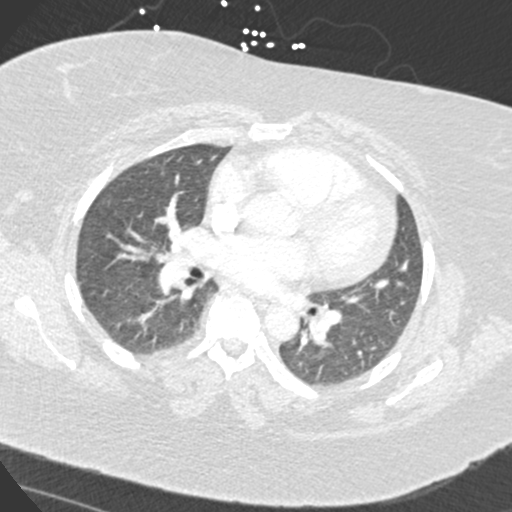
[im 190/285  mediastinal]
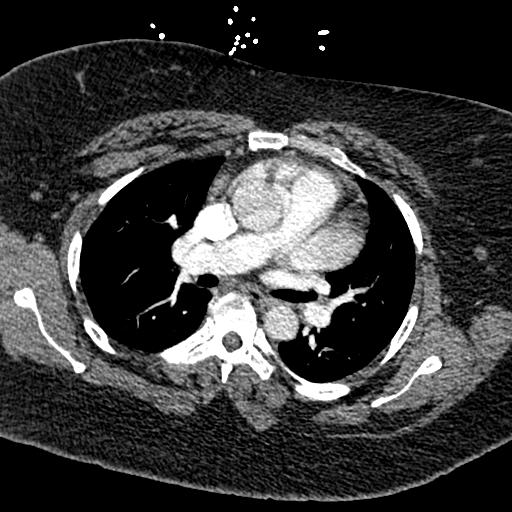
[im 206/285  lung]
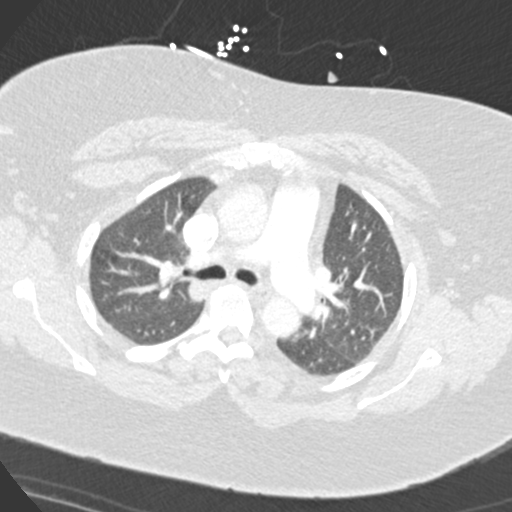
[im 221/285  mediastinal]
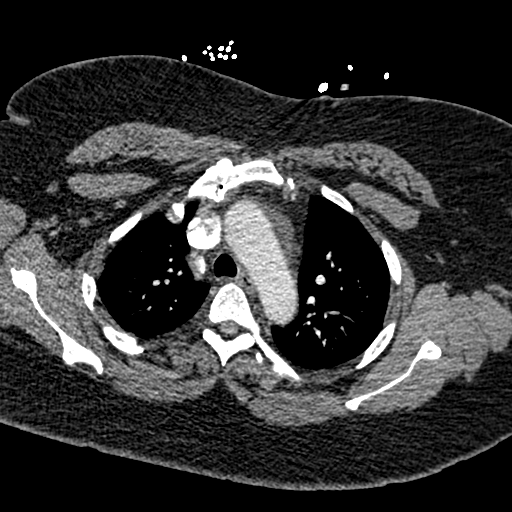
[im 237/285  lung]
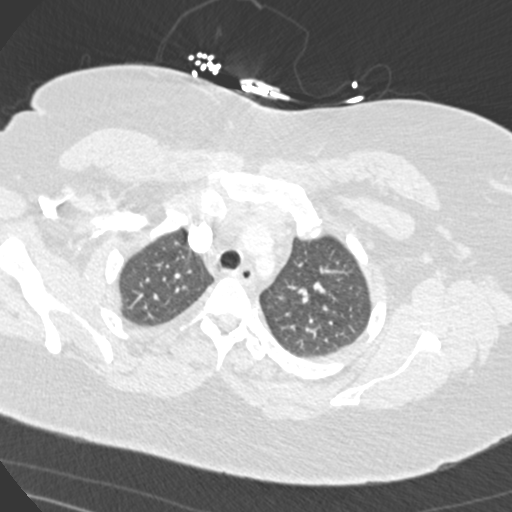
[im 253/285  mediastinal]
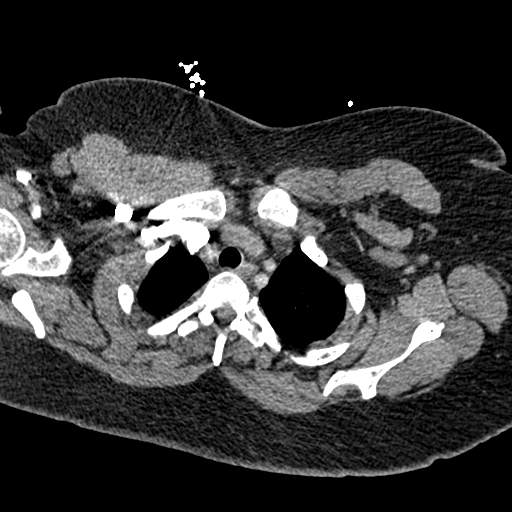
[im 269/285  lung]
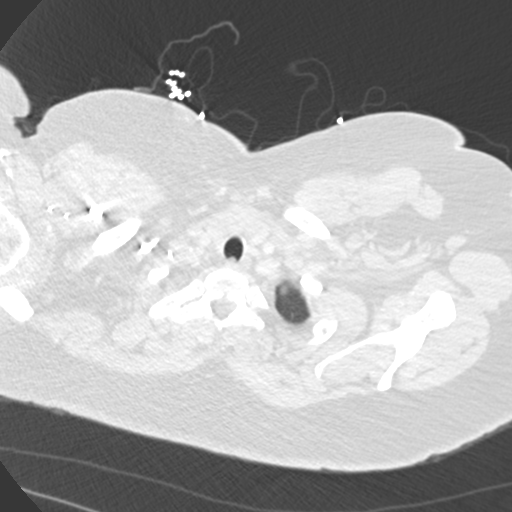

[Series 7: coronal mpr · coronal · 0.57mm/px · 1 of 151 slices shown]
[im 76/151  mediastinal]
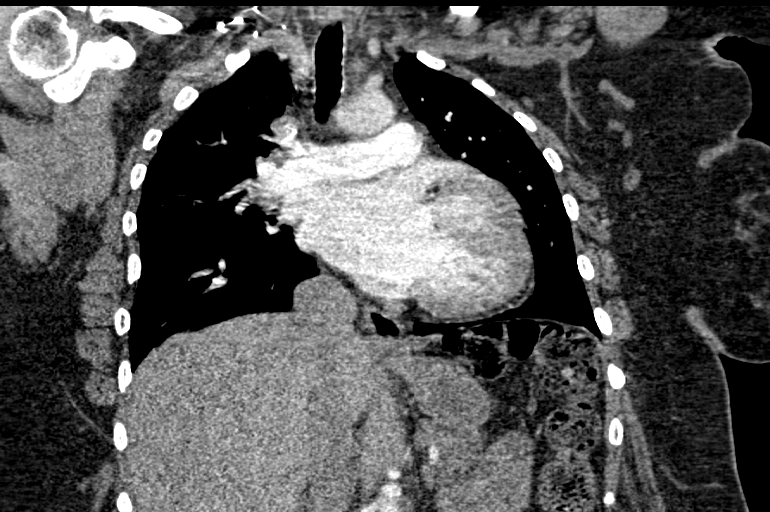

[18 of 36 positions shown; findings below may reference images not displayed]

RADIATION DOSE REDUCTION: This exam was performed according to the
departmental dose-optimization program which includes automated
exposure control, adjustment of the mA and/or kV according to
patient size and/or use of iterative reconstruction technique.

CONTRAST:  80mL OMNIPAQUE IOHEXOL 350 MG/ML SOLN
FINDINGS: Cardiovascular: Adequate contrast bolus timing. Evaluation of the
segmental and subsegmental pulmonary arterial branches is limited
secondary to beam hardening artifact related to patient habitus and
respiratory motion artifact. No filling defect is identified to the
lobar branch level. Thoracic aorta is normal in course and caliber.
Heart size is normal. No pericardial effusion.

Mediastinum/Nodes: No enlarged mediastinal, hilar, or axillary lymph
nodes. Thyroid gland, trachea, and esophagus demonstrate no
significant findings.

Lungs/Pleura: Lungs are clear. No pleural effusion or pneumothorax.

Upper Abdomen: No acute abnormality.

Musculoskeletal: No chest wall abnormality. Multilevel endplate
osteophytes within the mid to lower thoracic spine. No acute or
significant osseous findings.

Review of the MIP images confirms the above findings.
IMPRESSION: 1. Slightly limited exam. No PE is identified to the lobar branch
level.
2. Lungs are clear.

## 2023-10-02 ENCOUNTER — Encounter (INDEPENDENT_AMBULATORY_CARE_PROVIDER_SITE_OTHER): Payer: Self-pay

## 2023-10-02 ENCOUNTER — Other Ambulatory Visit: Payer: Self-pay

## 2023-10-02 ENCOUNTER — Other Ambulatory Visit: Payer: Self-pay | Admitting: Pharmacy Technician

## 2023-10-02 NOTE — Progress Notes (Signed)
 Specialty Pharmacy Refill Coordination Note  T Cheong is a 48 y.o. adult contacted today regarding refills of specialty medication(s) Dupilumab  (Dupixent )   Patient requested (Patient-Rptd) Delivery   Delivery date: 10/16/23 Verified address: (Patient-Rptd) [address] [place] [address]   Medication will be filled on 10/15/23.

## 2023-10-15 ENCOUNTER — Other Ambulatory Visit: Payer: Self-pay

## 2023-11-05 ENCOUNTER — Other Ambulatory Visit: Payer: Self-pay

## 2023-11-05 ENCOUNTER — Encounter (INDEPENDENT_AMBULATORY_CARE_PROVIDER_SITE_OTHER): Payer: Self-pay

## 2023-11-05 ENCOUNTER — Other Ambulatory Visit (HOSPITAL_COMMUNITY): Payer: Self-pay

## 2023-11-05 NOTE — Progress Notes (Signed)
 Specialty Pharmacy Refill Coordination Note  MyChart Questionnaire Submission  Rebecca Collier is a 48 y.o. adult contacted today regarding refills of specialty medication(s) Dupixent .  Doses on hand: (Patient-Rptd) 1   Injection date: (Patient-Rptd) 11/05/23  Patient requested: (Patient-Rptd) Delivery   Delivery date: 11/07/23  Verified address: [address] [place] [address]  Medication will be filled on 11/06/23.

## 2023-11-10 ENCOUNTER — Other Ambulatory Visit: Payer: Self-pay | Admitting: Internal Medicine

## 2023-11-10 DIAGNOSIS — M1990 Unspecified osteoarthritis, unspecified site: Secondary | ICD-10-CM

## 2023-11-29 ENCOUNTER — Ambulatory Visit: Admitting: Internal Medicine

## 2023-12-05 ENCOUNTER — Ambulatory Visit: Admitting: Internal Medicine

## 2023-12-05 VITALS — BP 132/82 | HR 58 | Temp 98.0°F | Ht 66.0 in | Wt 355.2 lb

## 2023-12-05 DIAGNOSIS — G43D0 Abdominal migraine, not intractable: Secondary | ICD-10-CM | POA: Diagnosis not present

## 2023-12-05 DIAGNOSIS — R635 Abnormal weight gain: Secondary | ICD-10-CM

## 2023-12-05 DIAGNOSIS — K581 Irritable bowel syndrome with constipation: Secondary | ICD-10-CM | POA: Diagnosis not present

## 2023-12-05 DIAGNOSIS — M1990 Unspecified osteoarthritis, unspecified site: Secondary | ICD-10-CM

## 2023-12-05 DIAGNOSIS — I10 Essential (primary) hypertension: Secondary | ICD-10-CM

## 2023-12-05 DIAGNOSIS — R1013 Epigastric pain: Secondary | ICD-10-CM

## 2023-12-05 DIAGNOSIS — R109 Unspecified abdominal pain: Secondary | ICD-10-CM

## 2023-12-05 DIAGNOSIS — R194 Change in bowel habit: Secondary | ICD-10-CM | POA: Diagnosis not present

## 2023-12-05 DIAGNOSIS — R630 Anorexia: Secondary | ICD-10-CM

## 2023-12-05 DIAGNOSIS — K2970 Gastritis, unspecified, without bleeding: Secondary | ICD-10-CM | POA: Diagnosis not present

## 2023-12-05 DIAGNOSIS — K209 Esophagitis, unspecified without bleeding: Secondary | ICD-10-CM

## 2023-12-05 DIAGNOSIS — F5104 Psychophysiologic insomnia: Secondary | ICD-10-CM

## 2023-12-05 DIAGNOSIS — K30 Functional dyspepsia: Secondary | ICD-10-CM

## 2023-12-05 DIAGNOSIS — Z1388 Encounter for screening for disorder due to exposure to contaminants: Secondary | ICD-10-CM

## 2023-12-05 DIAGNOSIS — J45909 Unspecified asthma, uncomplicated: Secondary | ICD-10-CM | POA: Insufficient documentation

## 2023-12-05 MED ORDER — AMLODIPINE BESYLATE 10 MG PO TABS
10.0000 mg | ORAL_TABLET | Freq: Every day | ORAL | 0 refills | Status: DC
  Filled 2024-02-12 – 2024-04-04 (×2): qty 30, 30d supply, fill #0

## 2023-12-05 MED ORDER — CYPROHEPTADINE HCL 4 MG PO TABS: 4.0000 mg | ORAL_TABLET | Freq: Three times a day (TID) | ORAL | 1 refills | Status: AC

## 2023-12-05 MED ORDER — SPIRONOLACTONE 25 MG PO TABS: 25.0000 mg | ORAL_TABLET | Freq: Every day | ORAL | 3 refills | Status: AC

## 2023-12-05 MED ORDER — TRAZODONE HCL 50 MG PO TABS: ORAL_TABLET | ORAL | 0 refills | Status: DC

## 2023-12-05 MED ORDER — MELOXICAM 7.5 MG PO TABS: ORAL_TABLET | ORAL | 0 refills | Status: DC

## 2023-12-05 NOTE — Progress Notes (Signed)
 ==============================  [facility]: [id number]   -- Medical Office Visit --  Patient: Rebecca Collier      Age: 48 y.o.       Sex:  adult  Date:   12/05/2023 Today's Healthcare Provider: Bernardino KANDICE Cone, MD  ==============================   Chief Complaint: GI Problem (Pt has had some stomach pains has been coming and going now it has been two weeks none stop.hits her out of no where while she is at work and doing normal day activies.) and Medication Refill  Discussed the use of AI scribe software for clinical note transcription with the patient, who gave verbal consent to proceed.  History of Present Illness The patient is a 48 year old transgender man with a complex medical history including severe obesity (BMI ~57), hypertension, eczema, asthma, arthritis, insomnia, anxiety, and chronic migraine, presenting for evaluation of chronic upper abdominal pain. He reports a 2-3 year history of intermittent, crampy epigastric discomfort, typically localized to the upper abdomen without radiation. The pain is described as a dull, growling sensation, sometimes accompanied by mild headache during flares. Episodes last about a week and have recently increased in both frequency and severity, with the most recent episode persisting for over two weeks and causing him to miss work. Symptoms are most pronounced after eating, though no specific dietary trigger has been identified; the patient suspects possible correlation with meat but remains uncertain. He avoids spicy and fried foods, primarily eats salads and chicken, and limits beef intake. Over-the-counter remedies, including laxatives and digestive enzymes, have provided minimal relief. He maintains daily bowel movements, described as satisfactory, though he occasionally experiences a sensation of incomplete emptying. There is no reported blood, mucus, or significant change in stool frequency or consistency.  He denies nausea, vomiting, heartburn, acid reflux, belching, early satiety, bloating, weight loss, fever, night sweats, fatigue, or appetite change. He does not associate pain with stress or menstrual cycles. He takes several supplements, including vitamin D , B12, noni, black currant seed oil, burdock root, and a probiotic/digestive enzyme. He has a family history of colon cancer (mother, age 32) and completed a Cologuard test with normal results. No history of GI surgery, gallstones, ulcers, or celiac disease. Physical exam today is notable for absence of abdominal pain, with the patient appearing well and in no acute distress. Given the chronicity, impact on quality of life, and associated headache during episodes, the differential diagnosis includes functional dyspepsia, non-ulcer gastritis, gallbladder dysfunction, food intolerance (meat or gluten), early gastroparesis, IBS, and abdominal migraine. The patient is seeking a cost-effective workup, mindful of insurance coverage and deductible.  Key Features: Chronic and worsening upper abdominal pain, interfering with work Episodic nature, sometimes associated with headache No alarm features (GI bleeding, weight loss, severe constitutional symptoms) Negative colorectal cancer screening Family history of colon cancer Significant comorbidities (obesity, hypertension, asthma, migraine, anxiety) Multiple failed OTC interventions No current GI red flags but persistent symptoms warranting further evaluation    Background Reviewed: Problem List: has Intrinsic (allergic) eczema; Essential hypertension, benign; Family history of colon cancer in mother (age 72); Well female exam with routine gynecological exam; Class 3 severe obesity with body mass index (BMI) of 50.0 to 59.9 in adult [facility]); Psychophysiological insomnia; GAD (generalized anxiety disorder); Chronic migraine without aura without status migrainosus, not intractable; Vitamin D  deficiency;  Elevated blood sugar; Metabolic syndrome X; Morbid obesity (HCC); Snoring; Osteoarthritis of left knee; Uncomplicated asthma; Stomach pain; and Arthritis on their problem list. Past Medical History:  has a past medical history of Allergy , Anxiety, Arthritis, Asthma, Eczema, Hypertension, Obesity, and Sciatica. Past Surgical History:   has a past surgical history that includes Wisdom tooth extraction and No past surgeries. Social History:   reports that he has never smoked. He has never used smokeless tobacco. He reports that he does not currently use alcohol. He reports current drug use. Drug: Marijuana. Family History:  family history includes Cancer in his mother; Colon cancer in his mother; Healthy in his brother and father; Hypertension in his mother. Allergies:  is allergic to lisinopril , hydrochlorothiazide , losartan , and penicillins.   Medication Reconciliation: Current Outpatient Medications on File Prior to Visit  Medication Sig   albuterol  (VENTOLIN  HFA) 108 (90 Base) MCG/ACT inhaler INHALE 2 PUFFS INTO THE LUNGS EVERY 4 HOURS AS NEEDED FOR WHEEZING OR SHORTNESS OF BREATH.   BLACK CURRANT SEED OIL PO Take 1,300 mg by mouth 2 (two) times daily.   cyanocobalamin  (VITAMIN B12) 1000 MCG tablet Take 1 tablet (1,000 mcg total) by mouth daily.   DUPIXENT  300 MG/2ML SOAJ Inject one pen under the skin every 2 weeks   Noni 250 MG CAPS Take 1 capsule by mouth daily.   OVER THE COUNTER MEDICATION Take 450 mg by mouth daily. Burdock Root   triamcinolone  cream (KENALOG ) 0.1 % Apply 1 Application topically daily as needed.   vitamin E 180 MG (400 UNITS) capsule Take 400 Units by mouth daily.   Cholecalciferol (VITAMIN D ) 50 MCG (2000 UT) CAPS Take 1 capsule by mouth daily.   ondansetron  (ZOFRAN -ODT) 4 MG disintegrating tablet Take 1 tablet (4 mg total) by mouth every 8 (eight) hours as needed for nausea or vomiting (for nausea from wegovy  or other source).   QUEtiapine  (SEROQUEL ) 25 MG tablet TAKE 1  TABLET BY MOUTH EVERYDAY AT BEDTIME   SUMAtriptan  (IMITREX ) 50 MG tablet Take 1 tablet (50 mg total) by mouth daily as needed for migraine. May repeat in 2 hours if headache persists or recurs.   valsartan  (DIOVAN ) 80 MG tablet Take 1 tablet (80 mg total) by mouth daily.   No current facility-administered medications on file prior to visit.   Medications Discontinued During This Encounter  Medication Reason   spironolactone  (ALDACTONE ) 25 MG tablet Reorder   amLODipine  (NORVASC ) 10 MG tablet Reorder   traZODone  (DESYREL ) 50 MG tablet Reorder   meloxicam  (MOBIC ) 7.5 MG tablet Reorder     Physical Exam:    12/05/2023    9:48 AM 05/16/2022   11:50 AM 05/16/2022   11:46 AM  Vitals with BMI  Height 5' 6  5' 6  Weight 355 lbs 3 oz  347 lbs  BMI 57.36  56.03  Systolic 132 145 847  Diastolic 82 91 86  Pulse 58  72  Vital signs reviewed.  Nursing notes reviewed. Weight trend reviewed. Physical Activity: Insufficiently Active (12/05/2023)   Exercise Vital Sign    Days of Exercise per Week: 2 days    Minutes of Exercise per Session: 30 min   General Appearance:  No acute distress appreciable.   Well-groomed, healthy-appearing adult.  Well proportioned with no abnormal fat distribution.  Good muscle tone. Pulmonary:  Normal work of breathing at rest, no respiratory distress apparent. SpO2: 98 %  Musculoskeletal: All extremities are intact.  Neurological:  Awake, alert, oriented, and engaged.  No obvious focal neurological deficits or cognitive impairments.  Sensorium seems unclouded.   Speech is clear and coherent with logical content. Psychiatric:  Appropriate mood, pleasant and  cooperative demeanor, thoughtful and engaged during the exam  No abdomen pain right at this moment.  Results DIAGNOSTIC Cologuard: normal     12/05/2023    9:55 AM 01/27/2022    2:10 PM 01/27/2022    1:30 PM 04/27/2021    1:18 PM  PHQ 2/9 Scores  PHQ - 2 Score 0 1 0 1  PHQ- 9 Score  6      Office Visit  on 04/20/2022  Component Date Value Ref Range Status   Glucose 05/02/2022 134 (H)  70 - 99 mg/dL Final   BUN 96/94/7975 16  6 - 24 mg/dL Final   Creatinine, Ser 05/02/2022 1.06 (H)  0.57 - 1.00 mg/dL Final   eGFR 96/94/7975 66  >59 mL/min/1.73 Final   BUN/Creatinine Ratio 05/02/2022 15  9 - 23 Final   Sodium 05/02/2022 137  134 - 144 mmol/L Final   Potassium 05/02/2022 4.3  3.5 - 5.2 mmol/L Final   Chloride 05/02/2022 103  96 - 106 mmol/L Final   CO2 05/02/2022 19 (L)  20 - 29 mmol/L Final   Calcium 05/02/2022 10.4 (H)  8.7 - 10.2 mg/dL Final  Admission on 97/80/7975, Discharged on 04/17/2022  Component Date Value Ref Range Status   Sodium 04/17/2022 139  135 - 145 mmol/L Final   Potassium 04/17/2022 4.0  3.5 - 5.1 mmol/L Final   Chloride 04/17/2022 105  98 - 111 mmol/L Final   CO2 04/17/2022 28  22 - 32 mmol/L Final   Glucose, Bld 04/17/2022 110 (H)  70 - 99 mg/dL Final   BUN 97/80/7975 5 (L)  6 - 20 mg/dL Final   Creatinine, Ser 04/17/2022 0.85  0.44 - 1.00 mg/dL Final   Calcium 97/80/7975 9.6  8.9 - 10.3 mg/dL Final   Total Protein 97/80/7975 7.1  6.5 - 8.1 g/dL Final   Albumin 97/80/7975 3.8  3.5 - 5.0 g/dL Final   AST 97/80/7975 17  15 - 41 U/L Final   ALT 04/17/2022 25  0 - 44 U/L Final   Alkaline Phosphatase 04/17/2022 107  38 - 126 U/L Final   Total Bilirubin 04/17/2022 0.4  0.3 - 1.2 mg/dL Final   GFR, Estimated 04/17/2022 >60  >60 mL/min Final   Anion gap 04/17/2022 6  5 - 15 Final   WBC 04/17/2022 3.3 (L)  4.0 - 10.5 K/uL Final   RBC 04/17/2022 4.67  3.87 - 5.11 MIL/uL Final   Hemoglobin 04/17/2022 13.0  12.0 - 15.0 g/dL Final   HCT 97/80/7975 39.9  36.0 - 46.0 % Final   MCV 04/17/2022 85.4  80.0 - 100.0 fL Final   MCH 04/17/2022 27.8  26.0 - 34.0 pg Final   MCHC 04/17/2022 32.6  30.0 - 36.0 g/dL Final   RDW 97/80/7975 13.8  11.5 - 15.5 % Final   Platelets 04/17/2022 254  150 - 400 K/uL Final   nRBC 04/17/2022 0.0  0.0 - 0.2 % Final   Neutrophils Relative %  04/17/2022 39  % Final   Neutro Abs 04/17/2022 1.3 (L)  1.7 - 7.7 K/uL Final   Lymphocytes Relative 04/17/2022 45  % Final   Lymphs Abs 04/17/2022 1.5  0.7 - 4.0 K/uL Final   Monocytes Relative 04/17/2022 9  % Final   Monocytes Absolute 04/17/2022 0.3  0.1 - 1.0 K/uL Final   Eosinophils Relative 04/17/2022 6  % Final   Eosinophils Absolute 04/17/2022 0.2  0.0 - 0.5 K/uL Final   Basophils Relative 04/17/2022 1  %  Final   Basophils Absolute 04/17/2022 0.0  0.0 - 0.1 K/uL Final   Immature Granulocytes 04/17/2022 0  % Final   Abs Immature Granulocytes 04/17/2022 0.00  0.00 - 0.07 K/uL Final   Troponin I (High Sensitivity) 04/17/2022 5  <18 ng/L Final   Troponin I (High Sensitivity) 04/17/2022 6  <18 ng/L Final  Office Visit on 04/17/2022  Component Date Value Ref Range Status   SARS Coronavirus 2 Ag 04/17/2022 Positive (A)  Negative Final  Office Visit on 01/27/2022  Component Date Value Ref Range Status   COLOGUARD 02/10/2022 Negative  Negative Final  Admission on 01/18/2022, Discharged on 01/18/2022  Component Date Value Ref Range Status   SARS Coronavirus 2 by RT PCR 01/18/2022 NEGATIVE  NEGATIVE Final   Influenza A by PCR 01/18/2022 POSITIVE (A)  NEGATIVE Final   Influenza B by PCR 01/18/2022 NEGATIVE  NEGATIVE Final  Admission on 01/17/2022, Discharged on 01/17/2022  Component Date Value Ref Range Status   SARS Coronavirus 2 by RT PCR 01/17/2022 NEGATIVE  NEGATIVE Final   Influenza A by PCR 01/17/2022 NEGATIVE  NEGATIVE Final   Influenza B by PCR 01/17/2022 NEGATIVE  NEGATIVE Final  No image results found. No results found.       ASSESSMENT & PLAN   Assessment & Plan Stomach pain Abdominal pain, epigastric Anorexia Mild dietary indigestion Abnormal weight gain Irritable bowel syndrome with constipation Change in bowel habits Gastroesophagitis Abdominal migraine, not intractable Functional dyspepsia 1. Chronic Epigastric/Upper Abdominal Pain (ICD-10: R10.13, K30,  K29.70, K58.1, R19.4, K90.0) Chronic, intermittent, crampy upper abdominal pain for 2-3 years, now with increased frequency and duration. Pain is worsened by eating, sometimes accompanied by mild headache, but without alarming features (no GI bleeding, weight loss, or severe constitutional symptoms). Differential includes:  Functional dyspepsia (most likely, given chronicity, meal association, and lack of structural findings) Abdominal migraine (considering episodic pain with headache; trial of cyproheptadine initiated) Non-ulcer gastritis (including H. pylori infection) Gallbladder dysfunction (biliary dyskinesia/sludge; pain postprandial) Food intolerance (meat or gluten; celiac panel ordered) Gastroparesis (less likely, but considered due to meal-related symptoms) IBS with constipation (occasional sensation of incomplete evacuation) Gastroesophagitis (low suspicion; no heartburn, but included for completeness) SIBO (less likely; no bloating, but considered for refractory symptoms) Plan:  Medication: Start cyproheptadine 4 mg at bedtime for suspected abdominal migraine; monitor response. Labs: Comprehensive metabolic panel with GFR, CBC with Diff/Platelet, H. pylori breath test, celiac disease comprehensive panel, lipase. Imaging: RUQ abdominal ultrasound to evaluate gallbladder and liver. Referral: Gastroenterology for further evaluation, possible endoscopy or motility studies if initial workup unrevealing. Lifestyle/Diet: Maintain food and symptom diary to identify triggers; trial reduction in meat intake. Monitoring: Return if symptoms worsen, persist, or new alarm features develop.   Justification for Orders: All labs, imaging, and referrals are medically indicated due to chronic, recurrent, and functionally impairing upper abdominal pain, with associated mild headache and family history of colon cancer. The comprehensive workup is necessary to exclude treatable and serious causes, and  to guide cost-effective management.  Let me know if you'd like this tailored to a specific EMR template or if you need a concise version for patient instructions!  Essential hypertension, benign History of well-controlled hypertension. Plan: Refill amlodipine  and spironolactone ; discontinue metoprolol  and hydralazine  per prior plan. Continue regular BP monitoring. Arthritis Chronic joint pain, managed with NSAID as needed. Plan: Refill meloxicam ; use as directed for pain control. Psychophysiological insomnia Difficulty with sleep initiation and maintenance. Plan: Refill trazodone ; use 25-50 mg at bedtime as  needed.   Medication Safety Reviewed all current medications and supplements; patient advised to pause non-essential supplements if GI symptoms persist. Patient Education & Engagement Patient actively participated in joint documentation and care planning. Discussed rationale for each test and medication. Instructed to follow up for results, or sooner if symptoms worsen or new concerns arise. Follow-up Routine follow-up after completion of labs and imaging, or sooner if symptoms escalate. GI referral to be scheduled for January, pending deductible status and initial workup.  ORDER ASSOCIATIONS  #   DIAGNOSIS / CONDITION ICD-10 ENCOUNTER ORDER     ICD-10-CM   1. Stomach pain  R10.9 Comprehensive metabolic panel with GFR    CBC with Differential/Platelet    H. pylori breath test    Celiac Disease Comprehensive Panel with Reflexes    US  Abdomen Limited RUQ (LIVER/GB)    Lipase    2. Uncomplicated asthma, unspecified asthma severity, unspecified whether persistent  J45.909     3. Essential hypertension, benign  I10 amLODipine  (NORVASC ) 10 MG tablet    spironolactone  (ALDACTONE ) 25 MG tablet    4. Arthritis  M19.90 meloxicam  (MOBIC ) 7.5 MG tablet    5. Psychophysiological insomnia  F51.04 traZODone  (DESYREL ) 50 MG tablet    6. Abdominal pain, epigastric  R10.13 CBC with  Differential/Platelet    H. pylori breath test    US  Abdomen Limited RUQ (LIVER/GB)    Lipase    Ambulatory referral to Gastroenterology    7. Abnormal weight gain  R63.5 Comprehensive metabolic panel with GFR    CBC with Differential/Platelet    H. pylori breath test    8. Anorexia  R63.0 Comprehensive metabolic panel with GFR    CBC with Differential/Platelet    H. pylori breath test    US  Abdomen Limited RUQ (LIVER/GB)    Lipase    9. Mild dietary indigestion  K30 Ambulatory referral to Gastroenterology    10. Gastroesophagitis  K29.70    K20.90     11. Irritable bowel syndrome with constipation  K58.1     12. Change in bowel habits  R19.4 Celiac Disease Comprehensive Panel with Reflexes    Lipase    13. Encounter for screening for disorder due to exposure to contaminants  Z13.88     14. Functional dyspepsia  K30 Celiac Disease Comprehensive Panel with Reflexes    Ambulatory referral to Gastroenterology           Orders Placed in Encounter:   Lab Orders         Comprehensive metabolic panel with GFR         CBC with Differential/Platelet         H. pylori breath test         Celiac Disease Comprehensive Panel with Reflexes         Lipase     Imaging Orders         US  Abdomen Limited RUQ (LIVER/GB)     Referral Orders         Ambulatory referral to Gastroenterology     Meds ordered this encounter  Medications   amLODipine  (NORVASC ) 10 MG tablet    Sig: Take 1 tablet (10 mg total) by mouth daily.    Dispense:  30 tablet    Refill:  0   meloxicam  (MOBIC ) 7.5 MG tablet    Sig: TAKE 1 TABLET BY MOUTH TWICE A DAY FOR 2 WEEKS AND THEN AS NEEDED    Dispense:  30 tablet  Refill:  0   spironolactone  (ALDACTONE ) 25 MG tablet    Sig: Take 1 tablet (25 mg total) by mouth daily.    Dispense:  90 tablet    Refill:  3    D/C METOPROLOL  AND HYDRALAZINE    traZODone  (DESYREL ) 50 MG tablet    Sig: TAKE 0.5-1 TABLETS (25-50 MG TOTAL) BY MOUTH AT BEDTIME AS NEEDED. FOR  SLEEP    Dispense:  30 tablet    Refill:  0    Need appointment for refills    Orders Placed This Encounter  Procedures   US  Abdomen Limited RUQ (LIVER/GB)    Standing Status:   Future    Expected Date:   02/28/2024    Expiration Date:   05/28/2024    Reason for Exam (SYMPTOM  OR DIAGNOSIS REQUIRED):   upper abdominal pain    Preferred imaging location?:   [facility]   Comprehensive metabolic panel with GFR    Standing Status:   Future    Expected Date:   02/28/2024    Expiration Date:   05/28/2024    Has the patient fasted?:   No    Release to patient:   Immediate [1]   CBC with Differential/Platelet    Standing Status:   Future    Expiration Date:   12/04/2024    Release to patient:   Immediate [1]   H. pylori breath test    Standing Status:   Future    Expected Date:   02/28/2024    Expiration Date:   05/28/2024   Celiac Disease Comprehensive Panel with Reflexes    Standing Status:   Future    Expected Date:   02/28/2024    Expiration Date:   05/28/2024   Lipase    Standing Status:   Future    Expected Date:   02/28/2024    Expiration Date:   05/28/2024   Ambulatory referral to Gastroenterology    Referral Priority:   Routine    Referral Type:   Consultation    Referral Reason:   Specialty Services Required    Number of Visits Requested:   1        This document was synthesized by artificial intelligence ([employer]bridge) using HIPAA-compliant recording of the clinical interaction;   We discussed the use of AI scribe software for clinical note transcription with the patient, who gave verbal consent to proceed. additional Info: This encounter employed state-of-the-art, real-time, collaborative documentation. The patient actively reviewed and assisted in updating their electronic medical record on a shared screen, ensuring transparency and facilitating joint problem-solving for the problem list, overview, and plan. This approach promotes accurate, informed care. The treatment plan was  discussed and reviewed in detail, including medication safety, potential side effects, and all patient questions. We confirmed understanding and comfort with the plan. Follow-up instructions were established, including contacting the office for any concerns, returning if symptoms worsen, persist, or new symptoms develop, and precautions for potential emergency department visits.

## 2023-12-05 NOTE — Assessment & Plan Note (Signed)
 History of well-controlled hypertension. Plan: Refill amlodipine  and spironolactone ; discontinue metoprolol  and hydralazine  per prior plan. Continue regular BP monitoring.

## 2023-12-05 NOTE — Assessment & Plan Note (Signed)
 Difficulty with sleep initiation and maintenance. Plan: Refill trazodone ; use 25-50 mg at bedtime as needed.

## 2023-12-05 NOTE — Patient Instructions (Addendum)
 It was a pleasure seeing you today! Your health and satisfaction are our top priorities.  Rebecca Cone, MD  VISIT SUMMARY: Today, we discussed your chronic stomach pain that has been ongoing for the past two to three years. We reviewed your symptoms, dietary habits, and family history. We also talked about the impact of the pain on your daily life and work. A plan was made to address your symptoms and identify potential triggers.  YOUR PLAN: -CHRONIC RECURRENT UPPER ABDOMINAL PAIN WITH ASSOCIATED HEADACHE: Chronic recurrent upper abdominal pain means you have been experiencing pain in your upper stomach area repeatedly over a long period. We discussed several possible causes, including functional dyspepsia, gastritis, gallbladder issues, meat intolerance, gastroparalysis, IBS, SIBO, and abdominal migraine. You will start taking cyproheptadine at bedtime and during episodes of pain. We will also do a comprehensive lab workup in January, including tests for celiac disease and liver function. A referral to a gastrointestinal specialist for a possible endoscopy in January was made. If symptoms persist, we may consider an abdominal ultrasound. Please keep a food and symptom diary to help identify any potential triggers.  -SUSPECTED ABDOMINAL MIGRAINE: Abdominal migraine is a condition where you experience stomach pain along with headaches. We prescribed cyproheptadine to take at bedtime and during episodes to see if it helps with your symptoms.  -SUSPECTED FUNCTIONAL DYSPEPSIA OR GASTRITIS: Functional dyspepsia or gastritis involves chronic discomfort in the upper stomach area. We suspect this might be contributing to your symptoms. We will do a comprehensive lab workup in January, including tests for celiac disease and liver function. A referral to a gastrointestinal specialist for a possible endoscopy in January was made. If symptoms persist, we may consider an abdominal ultrasound. Please keep a food and  symptom diary to help identify any potential triggers.  -SUSPECTED FOOD INTOLERANCE (MEAT OR GLUTEN): Food intolerance means your body has difficulty digesting certain foods, which might be causing your stomach pain. We suspect meat or gluten might be the issue. We will do a comprehensive lab workup in January, including a test for celiac disease. Please keep a food and symptom diary to help identify any potential triggers.  INSTRUCTIONS: Please start taking cyproheptadine at bedtime and during episodes of pain. Keep a food and symptom diary to help identify any potential triggers. We will do a comprehensive lab workup in January, including tests for celiac disease and liver function. You have a referral to see a gastrointestinal specialist in January for a possible endoscopy. If your symptoms persist and your deductible is met, we may consider an abdominal ultrasound.  Your Providers PCP: Rebecca Rebecca MATSU, MD,  [phone]) Referring Provider: Cone Rebecca MATSU, MD,  [phone]) Care Team Provider: [name], PA-C Care Team Provider: Raford Riggs, MD,  [phone])  NEXT STEPS: [x]  Early Intervention: Schedule sooner appointment, call our on-call services, or go to emergency room if there is any significant Increase in pain or discomfort New or worsening symptoms Sudden or severe changes in your health [x]  Flexible Follow-Up: We recommend a Return in about 4 months (around 04/06/2024). for optimal routine care. This allows for progress monitoring and treatment adjustments. [x]  Preventive Care: Schedule your annual preventive care visit! It's typically covered by insurance and helps identify potential health issues early. [x]  Lab & X-ray Appointments: Incomplete tests scheduled today, or call to schedule. X-rays: [facility] (M-F, 8:30am-noon or 1pm-5pm). [x]  Medical Information Release: Sign a release form at front desk to obtain relevant medical  information we  don't have.  MAKING THE MOST OF OUR FOCUSED 20 MINUTE APPOINTMENTS: [x]   Clearly state your top concerns at the beginning of the visit to focus our discussion [x]   If you anticipate you will need more time, please inform the front desk during scheduling - we can book multiple appointments in the same week. [x]   If you have transportation problems- use our convenient video appointments or ask about transportation support. [x]   We can get down to business faster if you use MyChart to update information before the visit and submit non-urgent questions before your visit. Thank you for taking the time to provide details through MyChart.  Let our nurse know and she can import this information into your encounter documents.  Arrival and Wait Times: [x]   Arriving on time ensures that everyone receives prompt attention. [x]   Early morning (8a) and afternoon (1p) appointments tend to have shortest wait times. [x]   Unfortunately, we cannot delay appointments for late arrivals or hold slots during phone calls.  Getting Answers and Following Up [x]   Simple Questions & Concerns: For quick questions or basic follow-up after your visit, reach us  at [phone] or MyChart messaging. [x]   Complex Concerns: If your concern is more complex, scheduling an appointment might be best. Discuss this with the staff to find the most suitable option. [x]   Lab & Imaging Results: We'll contact you directly if results are abnormal or you don't use MyChart. Most normal results will be on MyChart within 2-3 business days, with a review message from Dr. Jesus. Haven't heard back in 2 weeks? Need results sooner? Contact us  at [phone]. [x]   Referrals: Our referral coordinator will manage specialist referrals. The specialist's office should contact you within 2 weeks to schedule an appointment. Call us  if you haven't heard from them after 2 weeks.  Staying Connected [x]   MyChart: Activate your MyChart for  the fastest way to access results and message us . See the last page of this paperwork for instructions on how to activate.  Bring to Your Next Appointment [x]   Medications: Please bring all your medication bottles to your next appointment to ensure we have an accurate record of your prescriptions. [x]   Health Diaries: If you're monitoring any health conditions at home, keeping a diary of your readings can be very helpful for discussions at your next appointment.  Billing [x]   X-ray & Lab Orders: These are billed by separate companies. Contact the invoicing company directly for questions or concerns. [x]   Visit Charges: Discuss any billing inquiries with our administrative services team.  Your Satisfaction Matters [x]   Share Your Experience: We strive for your satisfaction! If you have any complaints, or preferably compliments, please let Dr. Jesus know directly or contact our Practice Administrators, Manuelita Rubin or [employer], by asking at the front desk.   Reviewing Your Records [x]   Review this early draft of your clinical encounter notes below and the final encounter summary tomorrow on MyChart after its been completed.  All orders placed so far are visible here: Stomach pain -     Comprehensive metabolic panel with GFR; Future -     CBC with Differential/Platelet; Future -     H. pylori breath test; Future -     Celiac Disease Comprehensive Panel with Reflexes; Future -     US  ABDOMEN LIMITED RUQ (LIVER/GB); Future -     Lipase; Future  Essential hypertension, benign -     amLODIPine  Besylate; Take 1 tablet (10 mg total)  by mouth daily.  Dispense: 30 tablet; Refill: 0 -     Spironolactone ; Take 1 tablet (25 mg total) by mouth daily.  Dispense: 90 tablet; Refill: 3  Arthritis -     Meloxicam ; TAKE 1 TABLET BY MOUTH TWICE A DAY FOR 2 WEEKS AND THEN AS NEEDED  Dispense: 30 tablet; Refill: 0  Psychophysiological insomnia -     traZODone  HCl; TAKE 0.5-1 TABLETS (25-50 MG TOTAL)  BY MOUTH AT BEDTIME AS NEEDED. FOR SLEEP  Dispense: 30 tablet; Refill: 0  Abdominal pain, epigastric -     CBC with Differential/Platelet; Future -     H. pylori breath test; Future -     US  ABDOMEN LIMITED RUQ (LIVER/GB); Future -     Lipase; Future -     Ambulatory referral to Gastroenterology  Abnormal weight gain -     Comprehensive metabolic panel with GFR; Future -     CBC with Differential/Platelet; Future -     H. pylori breath test; Future  Anorexia -     Comprehensive metabolic panel with GFR; Future -     CBC with Differential/Platelet; Future -     H. pylori breath test; Future -     US  ABDOMEN LIMITED RUQ (LIVER/GB); Future -     Lipase; Future  Mild dietary indigestion -     Ambulatory referral to Gastroenterology  Gastroesophagitis  Irritable bowel syndrome with constipation  Change in bowel habits -     Celiac Disease Comprehensive Panel with Reflexes; Future -     Lipase; Future  Encounter for screening for disorder due to exposure to contaminants  Functional dyspepsia -     Celiac Disease Comprehensive Panel with Reflexes; Future -     Ambulatory referral to Gastroenterology  Abdominal migraine, not intractable -     Cyproheptadine HCl; Start: 4 mg at bedtime; may increase to 4 mg TID if tolerated  Dispense: 30 tablet; Refill: 1    After Visit Summary (AVS) & Patient Guide - Reason for Visit: Chronic upper abdominal pain and headache  Summary of Today's Visit You came in today because you've been having upper stomach pain for 2-3 years, with flares that last about a week and are happening more often. The pain is crampy (not sharp), always in the same place, and tends to get worse after eating. Sometimes you also get a slight headache with the pain. There are no major changes in your bowel movements, though you sometimes feel like you haven't "fully gone." You haven't noticed weight loss, fever, fatigue, appetite changes, reflux, urinary issues, or  other concerning symptoms. You take several supplements (Vitamin D , B12, Ashwagandha, Noni, etc.), and your family history includes colon cancer. You've already had a normal colon cancer screening (Cologuard). The pain is starting to affect your work.  What Might Be Causing Your Symptoms Based on your history, the most likely diagnosis is functional dyspepsia -- a common condition where the upper stomach is sensitive or slow to empty, causing discomfort especially after eating. Other possibilities include: Abdominal migraine (especially since you get headaches with pain) Gastritis (irritation of the stomach lining) Gallbladder issues (pain after eating, especially fatty foods) Food intolerance (possibly meat or gluten) IBS with constipation (less likely, but considered) Other, less common causes We want to rule out anything serious and find what will actually help you feel better.  Next Steps: [employer] Your Money 1. Lab Tests [facility]) Comprehensive Metabolic Panel (CMP): Checks liver, kidney, and electrolytes CBC  with Differential: Checks for anemia or inflammation Lipase: Checks pancreas Celiac Panel: Screens for gluten intolerance H. pylori Breath Test: Checks for a stomach infection that can cause irritation 2. Abdominal Ultrasound Looks for gallstones, gallbladder problems, and liver issues 3. GI Specialist Referral A gastroenterologist can help with diagnosis and treatment if initial tests don't give answers 4. Food & Symptom Diary Track what you eat, when symptoms start, and bowel movements for 1-2 weeks This helps spot patterns and triggers 5. Medication Trial Cyproheptadine (Periactin): Start 4 mg at bedtime for possible abdominal migraine. This is safe to try and may help both pain and headache. Take for 2-4 weeks and note any changes.  What You Can Do Now Keep a food and symptom diary. Write down meals, symptoms (pain, headache, bowel changes), and severity (0-10  scale). Try pausing non-essential supplements for a few weeks to see if symptoms improve. Eat smaller, more frequent meals; avoid heavy, fatty, or fried foods. Take cyproheptadine as prescribed and track your response. Follow up for test results, or sooner if symptoms worsen or you have new concerns (severe pain, vomiting, blood in stool, weight loss, etc.). Continue colon cancer screening as recommended due to family history.  What We're Ruling Out With These Tests Test/Action What It Checks For  CMP, CBC, Lipase Liver, kidney, pancreas, anemia  H. pylori Breath Test Stomach infection  Celiac Panel Gluten intolerance  RUQ Ultrasound Gallbladder/liver problems  GI Referral Expert diagnosis/treatment  Food Diary Patterns/triggers  Cyproheptadine Response supports abdominal migraine   When to Seek Help Urgently Call or go to the ER if you have: Severe or worsening pain Vomiting blood or blood in stool Unintentional weight loss Persistent fever Trouble breathing or chest pain  Insurance and Coverage Tips The lab tests and imaging are ordered with specific codes to maximize coverage and show medical necessity. If you get insurance questions, mention these diagnoses: epigastric pain, functional dyspepsia, gastritis, abnormal weight gain, change in bowel habits, and family history of colon cancer.  Questions? Next Steps? Bring this summary to your GI specialist or primary care doctor. Let us  know if you want a printable food/symptom diary template. Contact the office if you have questions about your medications, tests, or symptoms.  Affirming Care Your identity and health history are important parts of your care. We're committed to providing respectful, affirming, and thorough support for all your health concerns.  Thank you for partnering in your care. We're here to help you get answers and relief!

## 2023-12-05 NOTE — Assessment & Plan Note (Signed)
 1. Chronic Epigastric/Upper Abdominal Pain (ICD-10: R10.13, K30, K29.70, K58.1, R19.4, K90.0) Chronic, intermittent, crampy upper abdominal pain for 2-3 years, now with increased frequency and duration. Pain is worsened by eating, sometimes accompanied by mild headache, but without alarming features (no GI bleeding, weight loss, or severe constitutional symptoms). Differential includes:  Functional dyspepsia (most likely, given chronicity, meal association, and lack of structural findings) Abdominal migraine (considering episodic pain with headache; trial of cyproheptadine initiated) Non-ulcer gastritis (including H. pylori infection) Gallbladder dysfunction (biliary dyskinesia/sludge; pain postprandial) Food intolerance (meat or gluten; celiac panel ordered) Gastroparesis (less likely, but considered due to meal-related symptoms) IBS with constipation (occasional sensation of incomplete evacuation) Gastroesophagitis (low suspicion; no heartburn, but included for completeness) SIBO (less likely; no bloating, but considered for refractory symptoms) Plan:  Medication: Start cyproheptadine 4 mg at bedtime for suspected abdominal migraine; monitor response. Labs: Comprehensive metabolic panel with GFR, CBC with Diff/Platelet, H. pylori breath test, celiac disease comprehensive panel, lipase. Imaging: RUQ abdominal ultrasound to evaluate gallbladder and liver. Referral: Gastroenterology for further evaluation, possible endoscopy or motility studies if initial workup unrevealing. Lifestyle/Diet: Maintain food and symptom diary to identify triggers; trial reduction in meat intake. Monitoring: Return if symptoms worsen, persist, or new alarm features develop.   Justification for Orders: All labs, imaging, and referrals are medically indicated due to chronic, recurrent, and functionally impairing upper abdominal pain, with associated mild headache and family history of colon cancer. The comprehensive  workup is necessary to exclude treatable and serious causes, and to guide cost-effective management.  Let me know if you'd like this tailored to a specific EMR template or if you need a concise version for patient instructions!

## 2023-12-05 NOTE — Assessment & Plan Note (Signed)
 Chronic joint pain, managed with NSAID as needed. Plan: Refill meloxicam ; use as directed for pain control.

## 2023-12-06 ENCOUNTER — Other Ambulatory Visit: Payer: Self-pay

## 2023-12-11 ENCOUNTER — Encounter (INDEPENDENT_AMBULATORY_CARE_PROVIDER_SITE_OTHER): Payer: Self-pay

## 2023-12-11 ENCOUNTER — Other Ambulatory Visit (HOSPITAL_COMMUNITY): Payer: Self-pay

## 2023-12-11 NOTE — Progress Notes (Signed)
 Specialty Pharmacy Refill Coordination Note  Rebecca Collier is a 48 y.o. adult contacted today regarding refills of specialty medication(s) Dupilumab  (Dupixent )   Patient requested Delivery   Delivery date: 12/13/23   Verified address: [address]   Medication will be filled on 12/12/23.

## 2023-12-16 ENCOUNTER — Other Ambulatory Visit: Payer: Self-pay | Admitting: Internal Medicine

## 2023-12-16 DIAGNOSIS — G43D0 Abdominal migraine, not intractable: Secondary | ICD-10-CM

## 2023-12-18 ENCOUNTER — Ambulatory Visit: Admitting: Internal Medicine

## 2023-12-31 ENCOUNTER — Other Ambulatory Visit: Payer: Self-pay | Admitting: Internal Medicine

## 2023-12-31 DIAGNOSIS — F5104 Psychophysiologic insomnia: Secondary | ICD-10-CM

## 2024-01-02 ENCOUNTER — Encounter (INDEPENDENT_AMBULATORY_CARE_PROVIDER_SITE_OTHER): Payer: Self-pay

## 2024-01-02 ENCOUNTER — Other Ambulatory Visit (HOSPITAL_COMMUNITY): Payer: Self-pay

## 2024-01-03 ENCOUNTER — Other Ambulatory Visit: Payer: Self-pay

## 2024-01-03 ENCOUNTER — Other Ambulatory Visit: Payer: Self-pay | Admitting: Pharmacy Technician

## 2024-01-03 NOTE — Progress Notes (Signed)
 Clinical Intervention Note  Clinical Intervention Notes: Patient reported starting the herbal Tumeric, no DDIs were identified with her Dupixent .   Clinical Intervention Outcomes: Prevention of an adverse drug event   Silvano LOISE Blair Karel Santa

## 2024-01-03 NOTE — Progress Notes (Signed)
 Specialty Pharmacy Refill Coordination Note  T Rekowski is a 48 y.o. adult contacted today regarding refills of specialty medication(s) Dupilumab  (Dupixent )   Patient requested (Patient-Rptd) Delivery   Delivery date: 01/11/2024 Verified address: (Patient-Rptd) [address] [address]   Medication will be filled on: 01/10/2024

## 2024-01-10 ENCOUNTER — Other Ambulatory Visit: Payer: Self-pay

## 2024-01-22 ENCOUNTER — Other Ambulatory Visit (HOSPITAL_COMMUNITY): Payer: Self-pay

## 2024-01-24 ENCOUNTER — Other Ambulatory Visit: Payer: Self-pay | Admitting: Internal Medicine

## 2024-01-24 DIAGNOSIS — M1990 Unspecified osteoarthritis, unspecified site: Secondary | ICD-10-CM

## 2024-01-30 ENCOUNTER — Other Ambulatory Visit (HOSPITAL_COMMUNITY): Payer: Self-pay

## 2024-01-31 ENCOUNTER — Other Ambulatory Visit: Payer: Self-pay

## 2024-02-01 ENCOUNTER — Other Ambulatory Visit: Payer: Self-pay

## 2024-02-04 ENCOUNTER — Other Ambulatory Visit (HOSPITAL_COMMUNITY): Payer: Self-pay

## 2024-02-05 ENCOUNTER — Other Ambulatory Visit: Payer: Self-pay

## 2024-02-05 ENCOUNTER — Other Ambulatory Visit (HOSPITAL_COMMUNITY): Payer: Self-pay

## 2024-02-05 NOTE — Progress Notes (Signed)
 Specialty Pharmacy Refill Coordination Note  MyChart Questionnaire Submission  T Halliday is a 48 y.o. adult contacted today regarding refills of specialty medication(s) Dupixent .  Doses on hand: 1 for 12/13   Injection date: 02/23/24  Patient requested: (Patient-Rptd) Delivery   Delivery date: 02/19/24  Verified address: [address] [place] [address]  Medication will be filled on 02/18/24

## 2024-02-06 ENCOUNTER — Ambulatory Visit: Admitting: Internal Medicine

## 2024-02-07 ENCOUNTER — Other Ambulatory Visit: Payer: Self-pay

## 2024-02-08 ENCOUNTER — Other Ambulatory Visit (HOSPITAL_COMMUNITY): Payer: Self-pay

## 2024-02-12 ENCOUNTER — Other Ambulatory Visit (HOSPITAL_COMMUNITY): Payer: Self-pay

## 2024-02-12 ENCOUNTER — Other Ambulatory Visit: Payer: Self-pay

## 2024-02-12 MED FILL — Meloxicam Tab 7.5 MG: ORAL | 16 days supply | Qty: 30 | Fill #0 | Status: CN

## 2024-02-12 MED FILL — Trazodone HCl Tab 50 MG: ORAL | 90 days supply | Qty: 90 | Fill #0 | Status: AC

## 2024-02-12 MED FILL — Trazodone HCl Tab 50 MG: ORAL | 90 days supply | Qty: 90 | Fill #0 | Status: CN

## 2024-02-22 ENCOUNTER — Other Ambulatory Visit (HOSPITAL_COMMUNITY): Payer: Self-pay

## 2024-02-25 ENCOUNTER — Other Ambulatory Visit (HOSPITAL_COMMUNITY): Payer: Self-pay

## 2024-03-10 ENCOUNTER — Other Ambulatory Visit: Payer: Self-pay

## 2024-03-12 ENCOUNTER — Other Ambulatory Visit: Payer: Self-pay

## 2024-03-14 ENCOUNTER — Other Ambulatory Visit: Payer: Self-pay

## 2024-03-14 ENCOUNTER — Other Ambulatory Visit (HOSPITAL_COMMUNITY): Payer: Self-pay

## 2024-03-21 ENCOUNTER — Encounter: Payer: Self-pay | Admitting: Gastroenterology

## 2024-04-04 ENCOUNTER — Other Ambulatory Visit: Payer: Self-pay | Admitting: Internal Medicine

## 2024-04-04 ENCOUNTER — Other Ambulatory Visit: Payer: Self-pay

## 2024-04-04 DIAGNOSIS — I10 Essential (primary) hypertension: Secondary | ICD-10-CM

## 2024-04-04 MED ORDER — AMLODIPINE BESYLATE 10 MG PO TABS
10.0000 mg | ORAL_TABLET | Freq: Every day | ORAL | 0 refills | Status: AC
  Filled 2024-04-04: qty 90, 90d supply, fill #0

## 2024-04-04 MED FILL — Trazodone HCl Tab 50 MG: ORAL | 90 days supply | Qty: 90 | Fill #0 | Status: AC
